# Patient Record
Sex: Female | Born: 1960 | Race: Black or African American | Hispanic: No | Smoking: Never smoker
Health system: Southern US, Community
[De-identification: ages and names within clinical notes are randomized; demographics above are authoritative.]

## PROBLEM LIST (undated history)

## (undated) DIAGNOSIS — R531 Weakness: Secondary | ICD-10-CM

## (undated) DIAGNOSIS — R7303 Prediabetes: Secondary | ICD-10-CM

## (undated) DIAGNOSIS — I10 Essential (primary) hypertension: Secondary | ICD-10-CM

## (undated) DIAGNOSIS — R569 Unspecified convulsions: Secondary | ICD-10-CM

## (undated) DIAGNOSIS — D332 Benign neoplasm of brain, unspecified: Secondary | ICD-10-CM

## (undated) HISTORY — DX: Unspecified convulsions: R56.9

## (undated) HISTORY — DX: Benign neoplasm of brain, unspecified: D33.2

## (undated) HISTORY — PX: BRAIN SURGERY: SHX531

---

## 2006-05-16 ENCOUNTER — Ambulatory Visit: Payer: Self-pay | Admitting: Obstetrics and Gynecology

## 2006-06-19 ENCOUNTER — Other Ambulatory Visit: Payer: Self-pay

## 2006-06-19 ENCOUNTER — Ambulatory Visit: Payer: Self-pay | Admitting: Obstetrics and Gynecology

## 2006-07-01 ENCOUNTER — Ambulatory Visit: Payer: Self-pay | Admitting: Obstetrics and Gynecology

## 2006-12-09 LAB — HM DEXA SCAN

## 2008-06-11 HISTORY — PX: ABDOMINAL HYSTERECTOMY: SHX81

## 2008-06-11 HISTORY — PX: BREAST BIOPSY: SHX20

## 2008-09-27 ENCOUNTER — Ambulatory Visit: Payer: Self-pay | Admitting: Surgery

## 2008-10-05 ENCOUNTER — Ambulatory Visit: Payer: Self-pay | Admitting: Surgery

## 2009-08-03 ENCOUNTER — Ambulatory Visit: Payer: Self-pay | Admitting: Obstetrics and Gynecology

## 2010-08-11 ENCOUNTER — Emergency Department: Payer: Self-pay | Admitting: Emergency Medicine

## 2010-08-22 ENCOUNTER — Ambulatory Visit: Payer: Self-pay | Admitting: Obstetrics and Gynecology

## 2010-09-04 ENCOUNTER — Ambulatory Visit: Payer: Self-pay | Admitting: Obstetrics and Gynecology

## 2011-07-04 ENCOUNTER — Emergency Department: Payer: Self-pay | Admitting: Emergency Medicine

## 2011-07-04 LAB — CBC WITH DIFFERENTIAL/PLATELET
Basophil #: 0 10*3/uL (ref 0.0–0.1)
Eosinophil %: 0.8 %
Lymphocyte #: 2.3 10*3/uL (ref 1.0–3.6)
MCHC: 33.8 g/dL (ref 32.0–36.0)
Monocyte #: 0.6 10*3/uL (ref 0.0–0.7)
Monocyte %: 5.8 %
Neutrophil %: 71.6 %
Platelet: 138 10*3/uL — ABNORMAL LOW (ref 150–440)
RBC: 4.62 10*6/uL (ref 3.80–5.20)
WBC: 10.6 10*3/uL (ref 3.6–11.0)

## 2011-07-04 LAB — COMPREHENSIVE METABOLIC PANEL
Alkaline Phosphatase: 63 U/L (ref 50–136)
BUN: 13 mg/dL (ref 7–18)
Calcium, Total: 9.4 mg/dL (ref 8.5–10.1)
Chloride: 105 mmol/L (ref 98–107)
Co2: 26 mmol/L (ref 21–32)
Creatinine: 0.83 mg/dL (ref 0.60–1.30)
EGFR (African American): >60
EGFR (Non-African Amer.): >60
Glucose: 112 mg/dL — ABNORMAL HIGH (ref 65–99)
SGOT(AST): 18 U/L (ref 15–37)
SGPT (ALT): 24 U/L
Total Protein: 7 g/dL (ref 6.4–8.2)

## 2011-07-04 LAB — URINALYSIS, COMPLETE
Bilirubin,UR: NEGATIVE
Blood: NEGATIVE
Glucose,UR: NEGATIVE mg/dL (ref 0–75)
Leukocyte Esterase: NEGATIVE
Ph: 6 (ref 4.5–8.0)
RBC,UR: <1 /HPF (ref 0–5)
Specific Gravity: 1.014 (ref 1.003–1.030)
Squamous Epithelial: <1

## 2011-07-12 ENCOUNTER — Ambulatory Visit: Payer: Self-pay | Admitting: Neurology

## 2011-07-16 ENCOUNTER — Ambulatory Visit: Payer: Self-pay | Admitting: Neurology

## 2011-09-14 ENCOUNTER — Ambulatory Visit: Payer: Self-pay | Admitting: Obstetrics and Gynecology

## 2011-09-14 LAB — HM MAMMOGRAPHY

## 2012-03-17 ENCOUNTER — Ambulatory Visit: Payer: Self-pay | Admitting: Surgery

## 2012-04-07 ENCOUNTER — Ambulatory Visit: Payer: Self-pay | Admitting: Surgery

## 2012-09-11 ENCOUNTER — Encounter: Payer: Self-pay | Admitting: Internal Medicine

## 2012-09-11 DIAGNOSIS — D496 Neoplasm of unspecified behavior of brain: Secondary | ICD-10-CM

## 2012-09-11 DIAGNOSIS — Z87898 Personal history of other specified conditions: Secondary | ICD-10-CM | POA: Insufficient documentation

## 2012-09-15 ENCOUNTER — Ambulatory Visit (INDEPENDENT_AMBULATORY_CARE_PROVIDER_SITE_OTHER): Payer: Medicare Other | Admitting: Internal Medicine

## 2012-09-15 ENCOUNTER — Encounter: Payer: Self-pay | Admitting: Internal Medicine

## 2012-09-15 VITALS — BP 128/78 | HR 80 | Temp 97.8°F | Resp 18 | Ht 62.0 in | Wt 175.5 lb

## 2012-09-15 DIAGNOSIS — D496 Neoplasm of unspecified behavior of brain: Secondary | ICD-10-CM

## 2012-09-15 DIAGNOSIS — Z13220 Encounter for screening for lipoid disorders: Secondary | ICD-10-CM

## 2012-09-15 DIAGNOSIS — G40909 Epilepsy, unspecified, not intractable, without status epilepticus: Secondary | ICD-10-CM

## 2012-09-16 ENCOUNTER — Ambulatory Visit: Payer: Self-pay | Admitting: Surgery

## 2012-09-28 ENCOUNTER — Encounter: Payer: Self-pay | Admitting: Internal Medicine

## 2012-09-28 DIAGNOSIS — Z8669 Personal history of other diseases of the nervous system and sense organs: Secondary | ICD-10-CM | POA: Insufficient documentation

## 2012-09-28 NOTE — Progress Notes (Signed)
  Subjective:    Patient ID: Rachael Zimmerman, female    DOB: Nov 06, 1960, 52 y.o.   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  Still has some residual problems in her right lower extremity.  Also has a history of recent seizure.  She comes in today to follow up on these issues as well as to establish care.  Daughter of Benjaman Lobe.  After her brain surgery, she was placed on Dilantin.  This was stopped.  Had a seizure 06/18/11 - witnessed.  Seeing Dr Sherryll Burger now.  On Keppra and has done well on this medication.  Has a history of abnormal pap smear 05/25/10 - pap ASCUS /positive HPV.  Had colpo 05/27/10 - no dysplasia.  Sees Dr  Logan Bores regularly now.  He does her pelvic and pap smears.  States she is up to date.  Overall she feels she is doing well.  Tries to stay active.  No cardiac symptoms with increased activity or exertion.  Breathing stable.  Bowels stable.    Past Medical History  Diagnosis Date  . Seizures   . Brain tumor (benign)     s/p craniectomy    Outpatient Encounter Prescriptions as of 09/15/2012  Medication Sig Dispense Refill  . estrogen-methylTESTOSTERone 0.625-1.25 MG per tablet Take 1 tablet by mouth daily.      Marland Kitchen levETIRAcetam (KEPPRA) 500 MG tablet Take 500 mg by mouth 2 (two) times daily.       No facility-administered encounter medications on file as of 09/15/2012.    Review of Systems Patient denies any headache, lightheadedness or dizziness.  On Keppra now.  No seizures.  Followed by Dr Sherryll Burger.  No chest pain, tightness or palpitations.  No increased shortness of breath, cough or congestion.  No nausea or vomiting.  No abdominal pain or cramping.  No bowel change, such as diarrhea, constipation, BRBPR or melana.  No urine change.  Overalls he feels she is doing well.       Objective:   Physical Exam Filed Vitals:   09/15/12 1038  BP: 128/78  Pulse: 80  Temp: 97.8 F (36.6 C)  Resp: 25   52 year old female in no acute distress.   HEENT:  Nares- clear.  Oropharynx - without lesions. NECK:  Supple.  Nontender.  No audible bruit.  HEART:  Appears to be regular. LUNGS:  No crackles or wheezing audible.  Respirations even and unlabored.  RADIAL PULSE:  Equal bilaterally.  ABDOMEN:  Soft, nontender.  Bowel sounds present and normal.  No audible abdominal bruit.   EXTREMITIES:  No increased edema present.  DP pulses palpable and equal bilaterally.          Assessment & Plan:  ELEVATED BLOOD PRESSURE.  Blood pressure on my check a little elevated.  Have her spot check her pressure.  Get her back in soon to reassess.  Check metabolic panel.    HEALTH MAINTENANCE.  Breast, pelvic and pap smears through Dr Logan Bores office.  Obtain results.  States she is up to date.  Needs colonoscopy - if not had.

## 2012-09-28 NOTE — Assessment & Plan Note (Signed)
S/p excision/craniectomy.  Has done well.  Just has some residual right leg weakness.  Follow.  Continues to follow up with Dr Shah.   

## 2012-09-28 NOTE — Assessment & Plan Note (Signed)
Followed by Dr Shah.  On Keppra and has done well.  Follow.   

## 2012-10-29 ENCOUNTER — Other Ambulatory Visit (INDEPENDENT_AMBULATORY_CARE_PROVIDER_SITE_OTHER): Payer: Medicare Other

## 2012-10-29 DIAGNOSIS — Z13220 Encounter for screening for lipoid disorders: Secondary | ICD-10-CM

## 2012-10-29 DIAGNOSIS — G40909 Epilepsy, unspecified, not intractable, without status epilepticus: Secondary | ICD-10-CM

## 2012-10-29 LAB — CBC WITH DIFFERENTIAL/PLATELET
Basophils Absolute: 0 10*3/uL (ref 0.0–0.1)
Eosinophils Relative: 1.2 % (ref 0.0–5.0)
MCV: 87.9 fl (ref 78.0–100.0)
Monocytes Absolute: 0.4 10*3/uL (ref 0.1–1.0)
Neutrophils Relative %: 53.7 % (ref 43.0–77.0)
Platelets: 153 10*3/uL (ref 150.0–400.0)
WBC: 7.1 10*3/uL (ref 4.5–10.5)

## 2012-10-29 LAB — TSH: TSH: 1.95 u[IU]/mL (ref 0.35–5.50)

## 2012-10-29 LAB — COMPREHENSIVE METABOLIC PANEL
ALT: 18 U/L (ref 0–35)
AST: 16 U/L (ref 0–37)
Albumin: 3.7 g/dL (ref 3.5–5.2)
Alkaline Phosphatase: 69 U/L (ref 39–117)
Glucose, Bld: 75 mg/dL (ref 70–99)
Potassium: 4.3 mEq/L (ref 3.5–5.1)
Sodium: 140 mEq/L (ref 135–145)
Total Protein: 7 g/dL (ref 6.0–8.3)

## 2012-10-29 LAB — LIPID PANEL
Cholesterol: 167 mg/dL (ref 0–200)
HDL: 50.3 mg/dL (ref >39.00)

## 2012-10-30 ENCOUNTER — Encounter: Payer: Self-pay | Admitting: *Deleted

## 2012-11-05 ENCOUNTER — Ambulatory Visit: Payer: Medicare Other | Admitting: Internal Medicine

## 2012-11-17 ENCOUNTER — Ambulatory Visit: Payer: Medicare Other | Admitting: Internal Medicine

## 2012-11-18 ENCOUNTER — Ambulatory Visit (INDEPENDENT_AMBULATORY_CARE_PROVIDER_SITE_OTHER): Payer: Medicare Other | Admitting: Internal Medicine

## 2012-11-18 ENCOUNTER — Encounter: Payer: Self-pay | Admitting: Internal Medicine

## 2012-11-18 VITALS — BP 130/90 | HR 84 | Temp 98.6°F | Ht 62.0 in | Wt 175.5 lb

## 2012-11-18 DIAGNOSIS — G40909 Epilepsy, unspecified, not intractable, without status epilepticus: Secondary | ICD-10-CM

## 2012-11-18 DIAGNOSIS — D496 Neoplasm of unspecified behavior of brain: Secondary | ICD-10-CM

## 2012-11-19 ENCOUNTER — Encounter: Payer: Self-pay | Admitting: Internal Medicine

## 2012-11-19 NOTE — Assessment & Plan Note (Signed)
S/p excision/craniectomy.  Has done well.  Just has some residual right leg weakness.  Follow.  Continues to follow up with Dr Shah.   

## 2012-11-19 NOTE — Progress Notes (Signed)
Subjective:    Patient ID: Rachael Zimmerman, female    DOB: 04-20-1961, 52 y.o.   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  Still has some residual problems in her right lower extremity.  Also has a history of recent seizure.  She comes in today for a scheduled follow up.   After her brain surgery, she was placed on Dilantin.  This was stopped.  Had a seizure 06/18/11 - witnessed.  Seeing Dr Sherryll Burger now.  On Keppra and has done well on this medication.  Has a history of abnormal pap smear 05/25/10 - pap ASCUS /positive HPV.  Had colpo 05/27/10 - no dysplasia.  Sees Dr  Logan Bores regularly now.  He does her pelvic and pap smears.  States she is up to date.   Tries to stay active. Has just started exercising.  No cardiac symptoms with increased activity or exertion.  Breathing stable.  Bowels stable. States her blood pressures at home have been averaging 128-130/85.  Increased stress.  Her fiancee had a stroke yesterday.  She was at the hospital all last night.  Did not sleep well.  Feels she is handling stress relatively well.  We discussed the use of estratest.  She is now taking this 3x/week.  Doing well on this dose.     Past Medical History  Diagnosis Date  . Seizures   . Brain tumor (benign)     s/p craniectomy    Outpatient Encounter Prescriptions as of 11/18/2012  Medication Sig Dispense Refill  . estrogen-methylTESTOSTERone 0.625-1.25 MG per tablet Take 1 tablet by mouth daily.      Marland Kitchen levETIRAcetam (KEPPRA) 500 MG tablet Take 500 mg by mouth 2 (two) times daily.       No facility-administered encounter medications on file as of 11/18/2012.    Review of Systems Patient denies any headache, lightheadedness or dizziness.  On Keppra now.  No seizures.  Followed by Dr Sherryll Burger.  No chest pain, tightness or palpitations.  No increased shortness of  breath, cough or congestion.  No nausea or vomiting.  No abdominal pain or cramping.  No bowel change, such as diarrhea, constipation, BRBPR or melana.  No urine change.  Has started exercising. Increased stress as outlined.      Objective:   Physical Exam  Filed Vitals:   11/18/12 1108  BP: 130/90  Pulse: 84  Temp: 98.6 F (37 C)   Blood pressure recheck:  120/74, pulse 65  52 year old female in no acute distress.   HEENT:  Nares- clear.  Oropharynx - without lesions. NECK:  Supple.  Nontender.  No audible bruit.  HEART:  Appears to be regular. LUNGS:  No crackles or wheezing audible.  Respirations even and unlabored.  RADIAL PULSE:  Equal bilaterally.  ABDOMEN:  Soft, nontender.  Bowel sounds present and normal.  No audible abdominal bruit.   EXTREMITIES:  No increased edema present.  DP pulses palpable and equal bilaterally.          Assessment & Plan:  ELEVATED BLOOD PRESSURE.  Blood pressure as outlined.  Follow.  Increased stress.  Follow metabolic panel.  Discussed the use of estratest.  She has cut down.  Will discuss with Dr Logan Bores.   HEALTH MAINTENANCE.  Breast, pelvic and pap smears through Dr Logan Bores office.   Needs colonoscopy - if not had.

## 2012-11-19 NOTE — Assessment & Plan Note (Signed)
Followed by Dr Shah.  On Keppra and has done well.  Follow.   

## 2013-01-06 ENCOUNTER — Encounter: Payer: Self-pay | Admitting: Internal Medicine

## 2013-01-06 ENCOUNTER — Ambulatory Visit (INDEPENDENT_AMBULATORY_CARE_PROVIDER_SITE_OTHER): Payer: Medicare Other | Admitting: Internal Medicine

## 2013-01-06 VITALS — BP 120/90 | HR 73 | Temp 98.5°F | Ht 62.0 in | Wt 175.8 lb

## 2013-01-06 DIAGNOSIS — I10 Essential (primary) hypertension: Secondary | ICD-10-CM

## 2013-01-06 DIAGNOSIS — G40909 Epilepsy, unspecified, not intractable, without status epilepticus: Secondary | ICD-10-CM

## 2013-01-06 DIAGNOSIS — D496 Neoplasm of unspecified behavior of brain: Secondary | ICD-10-CM

## 2013-01-06 MED ORDER — HYDROCHLOROTHIAZIDE 12.5 MG PO CAPS: 12.5000 mg | ORAL_CAPSULE | Freq: Every day | ORAL | Status: DC

## 2013-01-08 ENCOUNTER — Encounter: Payer: Self-pay | Admitting: Internal Medicine

## 2013-01-08 NOTE — Progress Notes (Signed)
Subjective:    Patient ID: Rachael Zimmerman, female    DOB: 1960-06-21, 52 y.o.   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  Still has some residual problems in her right lower extremity.  Also has a history of recent seizure.  She comes in today for a scheduled follow up.   After her brain surgery, she was placed on Dilantin.  This was stopped.  Had a seizure 06/18/11 - witnessed.  Seeing Dr Sherryll Burger now.  On Keppra and has done well on this medication.  Has a history of abnormal pap smear 05/25/10 - pap ASCUS /positive HPV.  Had colpo 05/27/10 - no dysplasia.  Sees Dr  Logan Bores regularly now.  He does her pelvic and pap smears.  States she is up to date.   Tries to stay active.  No cardiac symptoms with increased activity or exertion.  Breathing stable.  Bowels stable.  Stress better.  Blood pressure averaging 120's/90.  She is taking estratest 3x/week.  Doing fine on this dose.  No hot flashes.  Denies decreased libido.     Past Medical History  Diagnosis Date  . Seizures   . Brain tumor (benign)     s/p craniectomy    Outpatient Encounter Prescriptions as of 01/06/2013  Medication Sig Dispense Refill  . estrogen-methylTESTOSTERone 0.625-1.25 MG per tablet Take 1 tablet by mouth daily.      Marland Kitchen levETIRAcetam (KEPPRA) 500 MG tablet Take 500 mg by mouth 2 (two) times daily.      . hydrochlorothiazide (MICROZIDE) 12.5 MG capsule Take 1 capsule (12.5 mg total) by mouth daily.  30 capsule  2   No facility-administered encounter medications on file as of 01/06/2013.    Review of Systems Patient denies any headache, lightheadedness or dizziness.  On Keppra now.  No seizures.  Followed by Dr Sherryll Burger.  No chest pain, tightness or palpitations.  No increased shortness of breath, cough or congestion.  No nausea or vomiting.  No abdominal pain or cramping.  No  bowel change, such as diarrhea, constipation, BRBPR or melana.  No urine change.  Has started exercising.  Stress better.  Blood pressure elevated.       Objective:   Physical Exam  Filed Vitals:   01/06/13 1430  BP: 120/90  Pulse: 73  Temp: 98.5 F (36.9 C)   Blood pressure recheck:  64s/22-65  52 year old female in no acute distress.   HEENT:  Nares- clear.  Oropharynx - without lesions. NECK:  Supple.  Nontender.  No audible bruit.  HEART:  Appears to be regular. LUNGS:  No crackles or wheezing audible.  Respirations even and unlabored.  RADIAL PULSE:  Equal bilaterally.  ABDOMEN:  Soft, nontender.  Bowel sounds present and normal.  No audible abdominal bruit.   EXTREMITIES:  No increased edema present.  DP pulses palpable and equal bilaterally.          Assessment & Plan:  ELEVATED BLOOD PRESSURE.  Blood pressure as outlined.  Stress better.  Follow metabolic panel.  Discussed the use of estratest.  She has cut down.  Will schedule an appt with Dr Logan Bores to discuss continued use of estratest.  Pt desired to discuss this with him.  Will start HCTZ 12.5mg  q day.  Follow pressures.  Get her back in soon to reassess.   HEALTH MAINTENANCE.  Breast, pelvic and pap smears through Dr Logan Bores office.   Needs colonoscopy - if not had.

## 2013-01-08 NOTE — Assessment & Plan Note (Signed)
Followed by Dr Shah.  On Keppra and has done well.  Follow.   

## 2013-01-08 NOTE — Assessment & Plan Note (Signed)
S/p excision/craniectomy.  Has done well.  Just has some residual right leg weakness.  Follow.  Continues to follow up with Dr Shah.   

## 2013-02-04 ENCOUNTER — Encounter: Payer: Self-pay | Admitting: Internal Medicine

## 2013-02-04 ENCOUNTER — Ambulatory Visit (INDEPENDENT_AMBULATORY_CARE_PROVIDER_SITE_OTHER): Payer: Medicare Other | Admitting: Internal Medicine

## 2013-02-04 VITALS — BP 124/90 | HR 68 | Temp 98.0°F | Ht 62.0 in | Wt 175.5 lb

## 2013-02-04 DIAGNOSIS — I10 Essential (primary) hypertension: Secondary | ICD-10-CM

## 2013-02-04 DIAGNOSIS — G40909 Epilepsy, unspecified, not intractable, without status epilepticus: Secondary | ICD-10-CM

## 2013-02-04 DIAGNOSIS — D496 Neoplasm of unspecified behavior of brain: Secondary | ICD-10-CM

## 2013-02-04 LAB — BASIC METABOLIC PANEL
BUN: 11 mg/dL (ref 6–23)
CO2: 28 mEq/L (ref 19–32)
Calcium: 9.8 mg/dL (ref 8.4–10.5)
Glucose, Bld: 108 mg/dL — ABNORMAL HIGH (ref 70–99)
Sodium: 138 mEq/L (ref 135–145)

## 2013-02-05 ENCOUNTER — Encounter: Payer: Self-pay | Admitting: *Deleted

## 2013-02-08 ENCOUNTER — Encounter: Payer: Self-pay | Admitting: Internal Medicine

## 2013-02-08 DIAGNOSIS — I10 Essential (primary) hypertension: Secondary | ICD-10-CM | POA: Insufficient documentation

## 2013-02-08 NOTE — Assessment & Plan Note (Signed)
S/p excision/craniectomy.  Has done well.  Just has some residual right leg weakness.  Follow.  Continues to follow up with Dr Shah.   

## 2013-02-08 NOTE — Assessment & Plan Note (Signed)
Blood pressure doing better.  On HCTZ now.  Tolerating.  Will continue the current dose of HCTZ.  Follow pressures.  Check metabolic panel.

## 2013-02-08 NOTE — Assessment & Plan Note (Signed)
Followed by Dr Shah.  On Keppra and has done well.  Follow.   

## 2013-02-08 NOTE — Progress Notes (Signed)
  Subjective:    Patient ID: Rachael Zimmerman, female    DOB: [birth year], 52 y.o.   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  Still has some residual problems in her right lower extremity.  Also has a history of recent seizure.  She comes in today for a scheduled follow up.   After her brain surgery, she was placed on Dilantin.  This was stopped.  Had a seizure 06/18/11 - witnessed.  Seeing Dr Sherryll Burger now.  On Keppra and has done well on this medication.  Has a history of abnormal pap smear 05/25/10 - pap ASCUS /positive HPV.  Had colpo 05/27/10 - no dysplasia.  Sees Dr  Logan Bores regularly now.  He does her pelvic and pap smears.  States she is up to date.   Tries to stay active.  No cardiac symptoms with increased activity or exertion.  Breathing stable.  Bowels stable.  Stress better.  Blood pressure at Dr Logan Bores - 130/84 and another outside check 131/85.  Discussed with Dr Logan Bores regarding stopping the estratest.  She is tapering off.  Overall she feels she is doing well.  Tolerating the hctz.  No problems.     Past Medical History  Diagnosis Date  . Seizures   . Brain tumor (benign)     s/p craniectomy    Outpatient Encounter Prescriptions as of 02/04/2013  Medication Sig Dispense Refill  . estrogen-methylTESTOSTERone 0.625-1.25 MG per tablet Take 1 tablet by mouth daily.      . hydrochlorothiazide (MICROZIDE) 12.5 MG capsule Take 1 capsule (12.5 mg total) by mouth daily.  30 capsule  2  . levETIRAcetam (KEPPRA) 500 MG tablet Take 500 mg by mouth 2 (two) times daily.       No facility-administered encounter medications on file as of 02/04/2013.    Review of Systems Patient denies any headache, lightheadedness or dizziness.  On Keppra now.  No seizures.  Followed by Dr Sherryll Burger.  No chest pain, tightness or palpitations.  No increased shortness of  breath, cough or congestion.  No nausea or vomiting.  No abdominal pain or cramping.  No bowel change, such as diarrhea, constipation, BRBPR or melana.  No urine change.  Has started exercising.  Stress better.  Blood pressure as outlined.  Tolerating the hctz.       Objective:   Physical Exam  Filed Vitals:   02/04/13 1104  BP: 124/90  Pulse: 68  Temp: 98 F (36.7 C)   Blood pressure recheck:  43s/24-52  52 year old female in no acute distress.   HEENT:  Nares- clear.  Oropharynx - without lesions. NECK:  Supple.  Nontender.  No audible bruit.  HEART:  Appears to be regular. LUNGS:  No crackles or wheezing audible.  Respirations even and unlabored.  RADIAL PULSE:  Equal bilaterally.  ABDOMEN:  Soft, nontender.  Bowel sounds present and normal.  No audible abdominal bruit.   EXTREMITIES:  No increased edema present.  DP pulses palpable and equal bilaterally.          Assessment & Plan:  .HEALTH MAINTENANCE.  Breast, pelvic and pap smears through Dr Logan Bores office.   Needs colonoscopy - if not had.

## 2013-04-03 ENCOUNTER — Other Ambulatory Visit: Payer: Self-pay | Admitting: Internal Medicine

## 2013-04-06 ENCOUNTER — Ambulatory Visit: Payer: Medicare Other | Admitting: Internal Medicine

## 2013-04-21 ENCOUNTER — Ambulatory Visit (INDEPENDENT_AMBULATORY_CARE_PROVIDER_SITE_OTHER): Payer: Medicare Other | Admitting: Internal Medicine

## 2013-04-21 ENCOUNTER — Encounter: Payer: Self-pay | Admitting: Internal Medicine

## 2013-04-21 ENCOUNTER — Encounter (INDEPENDENT_AMBULATORY_CARE_PROVIDER_SITE_OTHER): Payer: Self-pay

## 2013-04-21 VITALS — BP 120/80 | HR 77 | Temp 97.4°F | Ht 62.0 in | Wt 179.5 lb

## 2013-04-21 DIAGNOSIS — G40909 Epilepsy, unspecified, not intractable, without status epilepticus: Secondary | ICD-10-CM

## 2013-04-21 DIAGNOSIS — I10 Essential (primary) hypertension: Secondary | ICD-10-CM

## 2013-04-21 DIAGNOSIS — D496 Neoplasm of unspecified behavior of brain: Secondary | ICD-10-CM

## 2013-04-21 NOTE — Assessment & Plan Note (Addendum)
Followed by Dr Shah.  On Keppra and has done well.  Follow.   

## 2013-04-21 NOTE — Progress Notes (Signed)
Pre-visit discussion using our clinic review tool. No additional management support is needed unless otherwise documented below in the visit note.  

## 2013-04-21 NOTE — Progress Notes (Signed)
  Subjective:    Patient ID: Rachael Zimmerman, female    DOB: [birth year], 52 y.o.   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  Still has some residual problems in her right lower extremity.  Also has a history of recent seizure.  She comes in today for a scheduled follow up.   After her brain surgery, she was placed on Dilantin.  This was stopped.  Had a seizure 06/18/11 - witnessed.  Seeing Dr Sherryll Burger now.  On Keppra and has done well on this medication.  Has a history of abnormal pap smear 05/25/10 - pap ASCUS /positive HPV.  Had colpo 05/27/10 - no dysplasia.  Sees Dr  Logan Bores regularly now.  He does her pelvic and pap smears.  States she is up to date.   Tries to stay active.  No cardiac symptoms with increased activity or exertion.  Breathing stable.  Bowels stable.  Stress better.   Off estratest.  Overall she feels she is doing well.  Tolerating the hctz.  No problems.  States her blood pressure is averaging 120s/80s.     Past Medical History  Diagnosis Date  . Seizures   . Brain tumor (benign)     s/p craniectomy    Outpatient Encounter Prescriptions as of 04/21/2013  Medication Sig  . hydrochlorothiazide (MICROZIDE) 12.5 MG capsule TAKE ONE CAPSULE BY MOUTH DAILY.  Marland Kitchen levETIRAcetam (KEPPRA) 500 MG tablet Take 500 mg by mouth 2 (two) times daily.  . [DISCONTINUED] estrogen-methylTESTOSTERone 0.625-1.25 MG per tablet Take 1 tablet by mouth daily.    Review of Systems Patient denies any headache, lightheadedness or dizziness.  On Keppra now.  No seizures.  Followed by Dr Sherryll Burger.  No chest pain, tightness or palpitations.  No increased shortness of breath, cough or congestion.  No nausea or vomiting.  No abdominal pain or cramping.  No bowel change, such as diarrhea, constipation, BRBPR or melana.  No urine change. Stress better.  Blood  pressure as outlined.  Tolerating the hctz.       Objective:   Physical Exam  Filed Vitals:   04/21/13 1019  BP: 120/80  Pulse: 77  Temp: 97.4 F (71.32 C)   52 year old female in no acute distress.   HEENT:  Nares- clear.  Oropharynx - without lesions. NECK:  Supple.  Nontender.  No audible bruit.  HEART:  Appears to be regular. LUNGS:  No crackles or wheezing audible.  Respirations even and unlabored.  RADIAL PULSE:  Equal bilaterally.  ABDOMEN:  Soft, nontender.  Bowel sounds present and normal.  No audible abdominal bruit.   EXTREMITIES:  No increased edema present.  DP pulses palpable and equal bilaterally.          Assessment & Plan:  .HEALTH MAINTENANCE.  Breast, pelvic and pap smears through Dr Logan Bores office.   Needs colonoscopy - if not had.

## 2013-04-21 NOTE — Assessment & Plan Note (Addendum)
S/p excision/craniectomy.  Has done well.  Just has some residual right leg weakness.  Follow.  Continues to follow up with Dr Shah.   

## 2013-04-21 NOTE — Assessment & Plan Note (Addendum)
Blood pressure doing better.  On HCTZ now.  Tolerating.  Will continue the current dose of HCTZ.  Follow pressures.  Follow metabolic panel.

## 2013-04-25 ENCOUNTER — Encounter: Payer: Self-pay | Admitting: Internal Medicine

## 2013-06-02 ENCOUNTER — Encounter: Payer: Self-pay | Admitting: Neurology

## 2013-06-11 ENCOUNTER — Encounter: Payer: Self-pay | Admitting: Neurology

## 2013-07-12 ENCOUNTER — Encounter: Payer: Self-pay | Admitting: Neurology

## 2013-08-09 ENCOUNTER — Encounter: Payer: Self-pay | Admitting: Neurology

## 2013-08-20 ENCOUNTER — Ambulatory Visit (INDEPENDENT_AMBULATORY_CARE_PROVIDER_SITE_OTHER): Payer: Medicare Other | Admitting: Internal Medicine

## 2013-08-20 ENCOUNTER — Encounter (INDEPENDENT_AMBULATORY_CARE_PROVIDER_SITE_OTHER): Payer: Self-pay

## 2013-08-20 ENCOUNTER — Encounter: Payer: Self-pay | Admitting: Internal Medicine

## 2013-08-20 VITALS — BP 118/80 | HR 85 | Temp 98.5°F | Ht 62.0 in | Wt 185.2 lb

## 2013-08-20 DIAGNOSIS — I10 Essential (primary) hypertension: Secondary | ICD-10-CM

## 2013-08-20 DIAGNOSIS — G40909 Epilepsy, unspecified, not intractable, without status epilepticus: Secondary | ICD-10-CM

## 2013-08-20 DIAGNOSIS — D496 Neoplasm of unspecified behavior of brain: Secondary | ICD-10-CM

## 2013-08-20 DIAGNOSIS — Z1211 Encounter for screening for malignant neoplasm of colon: Secondary | ICD-10-CM

## 2013-08-20 DIAGNOSIS — Z0279 Encounter for issue of other medical certificate: Secondary | ICD-10-CM

## 2013-08-20 LAB — BASIC METABOLIC PANEL
BUN: 16 mg/dL (ref 6–23)
CALCIUM: 10 mg/dL (ref 8.4–10.5)
CO2: 27 mEq/L (ref 19–32)
Chloride: 106 mEq/L (ref 96–112)
Creatinine, Ser: 0.9 mg/dL (ref 0.4–1.2)
GFR: 81.16 mL/min (ref >60.00)
Glucose, Bld: 111 mg/dL — ABNORMAL HIGH (ref 70–99)
POTASSIUM: 4.1 meq/L (ref 3.5–5.1)
SODIUM: 140 meq/L (ref 135–145)

## 2013-08-20 NOTE — Progress Notes (Signed)
  Subjective:    Patient ID: Rachael Zimmerman, female    DOB: 1960/09/12, 53 y.o.   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  Still has some residual problems in her right lower extremity.  Also has a history of recent seizure.  She comes in today for a scheduled follow up.   After her brain surgery, she was placed on Dilantin.  This was stopped.  Had a seizure 06/18/11 - witnessed.  Seeing Dr Manuella Ghazi now.  On Keppra and has done well on this medication.  Has a history of abnormal pap smear 05/25/10 - pap ASCUS /positive HPV.  Had colpo 05/27/10 - no dysplasia.  Sees Dr  Amalia Hailey regularly now.  He does her pelvic and pap smears.  States she is up to date.   Tries to stay active.  No cardiac symptoms with increased activity or exertion.  Breathing stable.  Bowels stable.  Stress better.   Off estratest.  Overall she feels she is doing well.  Tolerating the hctz.  No problems.  States her blood pressure is averaging 118-120/78082.     Past Medical History  Diagnosis Date  . Seizures   . Brain tumor (benign)     s/p craniectomy    Outpatient Encounter Prescriptions as of 08/20/2013  Medication Sig  . hydrochlorothiazide (MICROZIDE) 12.5 MG capsule TAKE ONE CAPSULE BY MOUTH DAILY.  Marland Kitchen levETIRAcetam (KEPPRA) 500 MG tablet Take 500 mg by mouth 2 (two) times daily.    Review of Systems Patient denies any headache, lightheadedness or dizziness.  On Keppra now.  No seizures.  Followed by Dr Manuella Ghazi.  No chest pain, tightness or palpitations.  No increased shortness of breath, cough or congestion.  No nausea or vomiting.  No abdominal pain or cramping.  No bowel change, such as diarrhea, constipation, BRBPR or melana.  No urine change. Stress better.  Blood pressure as outlined.  Tolerating the hctz.       Objective:   Physical Exam  Filed Vitals:   08/20/13 0806  BP: 118/80  Pulse: 85  Temp: 98.5 F (36.9 C)   Blood pressure recheck:  30/91  53 year old female in no acute distress.   HEENT:  Nares- clear.  Oropharynx - without lesions. NECK:  Supple.  Nontender.  No audible bruit.  HEART:  Appears to be regular. LUNGS:  No crackles or wheezing audible.  Respirations even and unlabored.  RADIAL PULSE:  Equal bilaterally.  ABDOMEN:  Soft, nontender.  Bowel sounds present and normal.  No audible abdominal bruit.   EXTREMITIES:  No increased edema present.          Assessment & Plan:  .HEALTH MAINTENANCE.  Breast, pelvic and pap smears through Dr Amalia Hailey office.   Just had 1/15.  States everything checked out fine.  Obtain records.  Needs colonoscopy.  Discussed with her today.  She will think about this and let me know if agreeable.  Scheduled for her f/u mammogram 09/17/13.  IFOB given.

## 2013-08-20 NOTE — Assessment & Plan Note (Addendum)
S/p excision/craniectomy.  Has done well.  Just has some residual right leg weakness.  Follow.  Continues to follow up with Dr Shah.   

## 2013-08-20 NOTE — Assessment & Plan Note (Addendum)
Blood pressure doing better.  On HCTZ.   Tolerating.  Will continue the current dose of HCTZ.  Follow pressures.  Follow metabolic panel.    

## 2013-08-20 NOTE — Progress Notes (Signed)
Pre-visit discussion using our clinic review tool. No additional management support is needed unless otherwise documented below in the visit note.  

## 2013-08-20 NOTE — Assessment & Plan Note (Addendum)
Followed by Dr Shah.  On Keppra and has done well.  Follow.   

## 2013-08-21 ENCOUNTER — Encounter: Payer: Self-pay | Admitting: *Deleted

## 2013-09-24 ENCOUNTER — Encounter: Payer: Self-pay | Admitting: Internal Medicine

## 2013-09-30 ENCOUNTER — Other Ambulatory Visit: Payer: Self-pay | Admitting: Internal Medicine

## 2013-11-20 ENCOUNTER — Telehealth: Payer: Self-pay | Admitting: Internal Medicine

## 2013-11-20 NOTE — Telephone Encounter (Signed)
Form placed in Dr. Nicki Reaper folder with note on requested pick up date.

## 2013-11-20 NOTE — Telephone Encounter (Signed)
Dropped off paperwork for employement.  Asking to pick up on Monday.  Advised may not be ready on Monday.  Asking for call when ready for pick up.

## 2013-11-20 NOTE — Telephone Encounter (Signed)
Noted. Will complete

## 2013-11-20 NOTE — Telephone Encounter (Signed)
Dropped off paperwork for employement. Asking to pick up on Monday. Advised may not be ready on Monday. Asking for call when ready for pick up.

## 2013-11-20 NOTE — Telephone Encounter (Signed)
Left message on voicemail that form is ready for pick up & placed up front.

## 2013-11-20 NOTE — Telephone Encounter (Signed)
Please see previous phone note.  Closed encounter in error.

## 2013-12-23 ENCOUNTER — Ambulatory Visit: Payer: Medicare Other | Admitting: Internal Medicine

## 2013-12-29 ENCOUNTER — Encounter: Payer: Self-pay | Admitting: Internal Medicine

## 2013-12-29 ENCOUNTER — Ambulatory Visit (INDEPENDENT_AMBULATORY_CARE_PROVIDER_SITE_OTHER): Payer: Medicare Other | Admitting: Internal Medicine

## 2013-12-29 VITALS — BP 126/80 | HR 80 | Temp 98.4°F | Ht 62.0 in | Wt 176.5 lb

## 2013-12-29 DIAGNOSIS — Z1239 Encounter for other screening for malignant neoplasm of breast: Secondary | ICD-10-CM

## 2013-12-29 DIAGNOSIS — I10 Essential (primary) hypertension: Secondary | ICD-10-CM

## 2013-12-29 DIAGNOSIS — Z13220 Encounter for screening for lipoid disorders: Secondary | ICD-10-CM

## 2013-12-29 DIAGNOSIS — D496 Neoplasm of unspecified behavior of brain: Secondary | ICD-10-CM

## 2013-12-29 DIAGNOSIS — M25569 Pain in unspecified knee: Secondary | ICD-10-CM

## 2013-12-29 DIAGNOSIS — G40909 Epilepsy, unspecified, not intractable, without status epilepticus: Secondary | ICD-10-CM

## 2013-12-29 DIAGNOSIS — M25561 Pain in right knee: Secondary | ICD-10-CM

## 2013-12-29 LAB — LIPID PANEL
CHOL/HDL RATIO: 3
Cholesterol: 162 mg/dL (ref 0–200)
HDL: 53.8 mg/dL (ref >39.00)
LDL Cholesterol: 95 mg/dL (ref 0–99)
NONHDL: 108.2
Triglycerides: 66 mg/dL (ref 0.0–149.0)
VLDL: 13.2 mg/dL (ref 0.0–40.0)

## 2013-12-29 LAB — CBC WITH DIFFERENTIAL/PLATELET
BASOS ABS: 0 10*3/uL (ref 0.0–0.1)
Basophils Relative: 0.3 % (ref 0.0–3.0)
EOS PCT: 1.3 % (ref 0.0–5.0)
Eosinophils Absolute: 0.1 10*3/uL (ref 0.0–0.7)
HEMATOCRIT: 39.1 % (ref 36.0–46.0)
HEMOGLOBIN: 13 g/dL (ref 12.0–15.0)
LYMPHS ABS: 3.7 10*3/uL (ref 0.7–4.0)
LYMPHS PCT: 43.5 % (ref 12.0–46.0)
MCHC: 33.3 g/dL (ref 30.0–36.0)
MCV: 87.8 fl (ref 78.0–100.0)
MONOS PCT: 6.3 % (ref 3.0–12.0)
Monocytes Absolute: 0.5 10*3/uL (ref 0.1–1.0)
Neutro Abs: 4.1 10*3/uL (ref 1.4–7.7)
Neutrophils Relative %: 48.6 % (ref 43.0–77.0)
Platelets: 155 10*3/uL (ref 150.0–400.0)
RBC: 4.45 Mil/uL (ref 3.87–5.11)
RDW: 13.9 % (ref 11.5–15.5)
WBC: 8.4 10*3/uL (ref 4.0–10.5)

## 2013-12-29 LAB — COMPREHENSIVE METABOLIC PANEL
ALT: 26 U/L (ref 0–35)
AST: 21 U/L (ref 0–37)
Albumin: 4 g/dL (ref 3.5–5.2)
Alkaline Phosphatase: 71 U/L (ref 39–117)
BILIRUBIN TOTAL: 0.8 mg/dL (ref 0.2–1.2)
BUN: 13 mg/dL (ref 6–23)
CALCIUM: 10.3 mg/dL (ref 8.4–10.5)
CHLORIDE: 102 meq/L (ref 96–112)
CO2: 27 meq/L (ref 19–32)
Creatinine, Ser: 0.9 mg/dL (ref 0.4–1.2)
GFR: 86.39 mL/min (ref >60.00)
GLUCOSE: 103 mg/dL — AB (ref 70–99)
Potassium: 4 mEq/L (ref 3.5–5.1)
Sodium: 137 mEq/L (ref 135–145)
Total Protein: 7.4 g/dL (ref 6.0–8.3)

## 2013-12-29 LAB — TSH: TSH: 1.4 u[IU]/mL (ref 0.35–4.50)

## 2013-12-29 NOTE — Progress Notes (Signed)
Pre visit review using our clinic review tool, if applicable. No additional management support is needed unless otherwise documented below in the visit note. 

## 2013-12-29 NOTE — Progress Notes (Signed)
Subjective:    Patient ID: Rachael Zimmerman, female    DOB: 1960-10-15, 53 y.o.   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  Still has some residual problems in her right lower extremity.  Also has a history of seizure disorder.   She comes in today for a scheduled follow up.   After her brain surgery, she was placed on Dilantin.  This was stopped.  Had a seizure 06/18/11 - witnessed.  Seeing Dr Manuella Ghazi now.  On Keppra and has done well on this medication.  Has a history of abnormal pap smear 05/25/10 - pap ASCUS /positive HPV.  Had colpo 05/27/10 - no dysplasia.  Sees Dr  Amalia Hailey regularly now.  He does her pelvic and pap smears.  States she is up to date.  Last 1/15.  Planning to follow up with Dr Ouida Sills.  Tries to stay active.  No cardiac symptoms with increased activity or exertion.  Breathing stable.  Bowels stable.  Stress better.   Off estratest.  Overall she feels she is doing well.  Tolerating the hctz.  No problems.  Did injure her knee in 5/15.  Went to acute care and then ortho.  Xray unrevealing.  Wearing a knee support.  Still with increased pain especially after sitting for a while.  Also notices some increased discomfort at the end of the day.     Past Medical History  Diagnosis Date  . Seizures   . Brain tumor (benign)     s/p craniectomy    Outpatient Encounter Prescriptions as of 12/29/2013  Medication Sig  . hydrochlorothiazide (MICROZIDE) 12.5 MG capsule TAKE ONE CAPSULE BY MOUTH DAILY.  Marland Kitchen levETIRAcetam (KEPPRA) 500 MG tablet Take 500 mg by mouth 2 (two) times daily.    Review of Systems Patient denies any headache, lightheadedness or dizziness.  On Keppra now.  No seizures.  Followed by Dr Manuella Ghazi.  No chest pain, tightness or palpitations.  No increased shortness of breath, cough or congestion.  No nausea or vomiting.   No acid reflux reported.  No abdominal pain or cramping.  No bowel change, such as diarrhea, constipation, BRBPR or melana.  No urine change. Stress better.  Tolerating the hctz.  Right knee pain as outlined.       Objective:   Physical Exam  Filed Vitals:   12/29/13 0903  BP: 126/80  Pulse: 80  Temp: 98.4 F (36.9 C)   Blood pressure recheck:  94/89  53 year old female in no acute distress.   HEENT:  Nares- clear.  Oropharynx - without lesions. NECK:  Supple.  Nontender.  No audible bruit.  HEART:  Appears to be regular. LUNGS:  No crackles or wheezing audible.  Respirations even and unlabored.  RADIAL PULSE:  Equal bilaterally.  ABDOMEN:  Soft, nontender.  Bowel sounds present and normal.  No audible abdominal bruit.   EXTREMITIES:  No increased edema present.   MSK:  Right knee tenderness to palpation over the right lateral aspect of the knee.  No significant pain with flexion and full extension.          Assessment & Plan:  .HEALTH MAINTENANCE.  Breast, pelvic and pap smears through Dr Amalia Hailey office.   Just had 1/15.  States everything checked out fine.  Planning to follow up with Dr Ouida Sills.  Needs colonoscopy.  Discussed with  her today.  She will think about this and let me know if agreeable.  Scheduled for her f/u mammogram 09/17/13. Missed her appt.  Will reschedule.  Needs to return IFOB.

## 2013-12-30 ENCOUNTER — Encounter: Payer: Self-pay | Admitting: *Deleted

## 2014-01-03 ENCOUNTER — Encounter: Payer: Self-pay | Admitting: Internal Medicine

## 2014-01-03 NOTE — Assessment & Plan Note (Signed)
Followed by Dr Manuella Ghazi.  On Keppra and has done well.  Follow.

## 2014-01-03 NOTE — Assessment & Plan Note (Signed)
S/p excision/craniectomy.  Has done well.  Just has some residual right leg weakness.  Follow.  Continues to follow up with Dr Manuella Ghazi.

## 2014-01-03 NOTE — Assessment & Plan Note (Addendum)
Persistent knee pain.  Has seen ortho as outlined.  Since persistent pain, will refer back to ortho for further evaluation and testing.

## 2014-01-03 NOTE — Assessment & Plan Note (Signed)
Blood pressure doing better.  On HCTZ.   Tolerating.  Will continue the current dose of HCTZ.  Follow pressures.  Follow metabolic panel.

## 2014-01-25 ENCOUNTER — Other Ambulatory Visit: Payer: Self-pay | Admitting: Internal Medicine

## 2014-03-25 ENCOUNTER — Ambulatory Visit (INDEPENDENT_AMBULATORY_CARE_PROVIDER_SITE_OTHER): Payer: Medicare Other

## 2014-03-25 DIAGNOSIS — Z23 Encounter for immunization: Secondary | ICD-10-CM

## 2014-05-03 ENCOUNTER — Ambulatory Visit: Payer: Medicare Other | Admitting: Internal Medicine

## 2014-06-14 ENCOUNTER — Encounter: Payer: Self-pay | Admitting: Neurology

## 2014-06-14 DIAGNOSIS — R2689 Other abnormalities of gait and mobility: Secondary | ICD-10-CM | POA: Diagnosis not present

## 2014-06-14 DIAGNOSIS — R279 Unspecified lack of coordination: Secondary | ICD-10-CM | POA: Diagnosis not present

## 2014-06-14 DIAGNOSIS — M6281 Muscle weakness (generalized): Secondary | ICD-10-CM | POA: Diagnosis not present

## 2014-06-17 DIAGNOSIS — R279 Unspecified lack of coordination: Secondary | ICD-10-CM | POA: Diagnosis not present

## 2014-06-17 DIAGNOSIS — R2689 Other abnormalities of gait and mobility: Secondary | ICD-10-CM | POA: Diagnosis not present

## 2014-06-17 DIAGNOSIS — M6281 Muscle weakness (generalized): Secondary | ICD-10-CM | POA: Diagnosis not present

## 2014-06-21 DIAGNOSIS — M6281 Muscle weakness (generalized): Secondary | ICD-10-CM | POA: Diagnosis not present

## 2014-06-21 DIAGNOSIS — R279 Unspecified lack of coordination: Secondary | ICD-10-CM | POA: Diagnosis not present

## 2014-06-21 DIAGNOSIS — R2689 Other abnormalities of gait and mobility: Secondary | ICD-10-CM | POA: Diagnosis not present

## 2014-06-23 DIAGNOSIS — R2689 Other abnormalities of gait and mobility: Secondary | ICD-10-CM | POA: Diagnosis not present

## 2014-06-23 DIAGNOSIS — M6281 Muscle weakness (generalized): Secondary | ICD-10-CM | POA: Diagnosis not present

## 2014-06-23 DIAGNOSIS — R279 Unspecified lack of coordination: Secondary | ICD-10-CM | POA: Diagnosis not present

## 2014-06-30 DIAGNOSIS — R2689 Other abnormalities of gait and mobility: Secondary | ICD-10-CM | POA: Diagnosis not present

## 2014-06-30 DIAGNOSIS — R279 Unspecified lack of coordination: Secondary | ICD-10-CM | POA: Diagnosis not present

## 2014-06-30 DIAGNOSIS — M6281 Muscle weakness (generalized): Secondary | ICD-10-CM | POA: Diagnosis not present

## 2014-07-07 DIAGNOSIS — Z1211 Encounter for screening for malignant neoplasm of colon: Secondary | ICD-10-CM | POA: Diagnosis not present

## 2014-07-07 DIAGNOSIS — Z01419 Encounter for gynecological examination (general) (routine) without abnormal findings: Secondary | ICD-10-CM | POA: Diagnosis not present

## 2014-07-07 DIAGNOSIS — R87810 Cervical high risk human papillomavirus (HPV) DNA test positive: Secondary | ICD-10-CM | POA: Diagnosis not present

## 2014-07-09 ENCOUNTER — Ambulatory Visit (INDEPENDENT_AMBULATORY_CARE_PROVIDER_SITE_OTHER): Payer: Medicare Other | Admitting: Internal Medicine

## 2014-07-09 ENCOUNTER — Encounter: Payer: Self-pay | Admitting: Internal Medicine

## 2014-07-09 VITALS — BP 126/86 | HR 91 | Temp 98.3°F | Ht 62.0 in | Wt 186.5 lb

## 2014-07-09 DIAGNOSIS — R269 Unspecified abnormalities of gait and mobility: Secondary | ICD-10-CM

## 2014-07-09 DIAGNOSIS — G40909 Epilepsy, unspecified, not intractable, without status epilepticus: Secondary | ICD-10-CM

## 2014-07-09 DIAGNOSIS — Z0000 Encounter for general adult medical examination without abnormal findings: Secondary | ICD-10-CM | POA: Diagnosis not present

## 2014-07-09 DIAGNOSIS — I10 Essential (primary) hypertension: Secondary | ICD-10-CM

## 2014-07-09 MED ORDER — HYDROCHLOROTHIAZIDE 12.5 MG PO CAPS: 12.5000 mg | ORAL_CAPSULE | Freq: Every day | ORAL | Status: DC

## 2014-07-09 NOTE — Progress Notes (Signed)
Pre visit review using our clinic review tool, if applicable. No additional management support is needed unless otherwise documented below in the visit note. 

## 2014-07-11 ENCOUNTER — Encounter: Payer: Self-pay | Admitting: Internal Medicine

## 2014-07-11 DIAGNOSIS — R269 Unspecified abnormalities of gait and mobility: Secondary | ICD-10-CM | POA: Insufficient documentation

## 2014-07-11 NOTE — Assessment & Plan Note (Signed)
Blood pressure doing well on current medication regimen.  Follow metabolic panel.

## 2014-07-11 NOTE — Assessment & Plan Note (Signed)
Working with physical therapy on gait training and strengthening.

## 2014-07-11 NOTE — Progress Notes (Signed)
Patient ID: Rachael Zimmerman, female   DOB: Nov 04, 1960, 54 y.o.   MRN: [id number]    Subjective:    Patient ID: Rachael Zimmerman, female    DOB: 11-Sep-1960, 54 y.o.   MRN: [id number]  HPI  The patient is here for annual Medicare wellness examination and management of other chronic and acute problems.   The risk factors are reflected in the social history.  The roster of all physicians providing medical care to patient -  Dr Ouida Sills - gyn Dr Manuella Ghazi - neurology Dr Gloriann Loan - optometry Dr Zigmund Daniel - Dentist  Activities of daily living:  The patient is 100% independent in all ADLs: dressing, toileting, feeding as well as independent mobility  Home safety : The patient has smoke detectors in the home. She  wears seatbelts.  There are no firearms at home. There is no violence in the home.   There is no risks for hepatitis, STDs or HIV. There is no history of blood transfusion. She has no travel history to infectious disease endemic areas of the world.  The patient has seen her dentist in the last six months. She has seen her eye doctor in the last year. She has not noticed any hearing difficulty with regard to whispered voices.  No ringing in her ears.  She does not  have excessive sun exposure. Discussed the need for sun protection: hats, long sleeves and use of sunscreen if there is significant sun exposure.   Diet: the importance of a healthy diet is discussed.  She does try to watch her diet.    The benefits of regular aerobic exercise were discussed. She plans to get more serious about regular exercise.    Depression screen: there are no signs or vegative symptoms of depression- irritability, change in appetite, anhedonia, sadness/tearfullness.  Cognitive assessment: the patient manages all her financial and personal affairs and is actively engaged. She could relate day,date,year and events; recalled 2/3 objects at 3 minutes.   The following portions of the patient's history were reviewed and  updated as appropriate: allergies, current medications, past family history, past medical history,  past surgical history, past social history  and problem list.  Visual acuity was not assessed since she has regular follow up with her ophthalmologist. Hearing and body mass index were reviewed.   During the course of the visit the patient was educated and counseled about appropriate screening and preventive services including : fall prevention , diabetes screening, nutrition counseling, colorectal cancer screening, and recommended immunizations.     Past Medical History  Diagnosis Date  . Seizures   . Brain tumor (benign)     s/p craniectomy    Outpatient Encounter Prescriptions as of 07/09/2014  Medication Sig  . hydrochlorothiazide (MICROZIDE) 12.5 MG capsule Take 1 capsule (12.5 mg total) by mouth daily.  Marland Kitchen levETIRAcetam (KEPPRA) 500 MG tablet Take 500 mg by mouth 2 (two) times daily.  . [DISCONTINUED] hydrochlorothiazide (MICROZIDE) 12.5 MG capsule TAKE ONE CAPSULE BY MOUTH DAILY.    She reports no recent seizures.  Seeing Dr Manuella Ghazi.  Planning for EEG in 07/2014.  On keppra.  Doing well.  Plans to get more serious about her exercise and diet.  Blood pressure dong well.  She has started back to therapy.  Working on her gait and strengthening exercises.     Review of Systems  Constitutional: Negative for fatigue and unexpected weight change.  HENT: Negative for congestion and sinus pressure.   Respiratory: Negative for cough, chest tightness and  shortness of breath.   Cardiovascular: Negative for chest pain, palpitations and leg swelling.  Gastrointestinal: Negative for nausea, vomiting and abdominal pain.  Neurological: Negative for dizziness, seizures and light-headedness.       Objective:    Physical Exam  Constitutional: No distress.  HENT:  Nose: Nose normal.  Mouth/Throat: Oropharynx is clear and moist.  Neck: Neck supple. No thyromegaly present.  Cardiovascular: Normal  rate and regular rhythm.   Pulmonary/Chest: Breath sounds normal. No respiratory distress. She has no wheezes.  Abdominal: Soft. Bowel sounds are normal. There is no tenderness.  Musculoskeletal: She exhibits no edema or tenderness.  Lymphadenopathy:    She has no cervical adenopathy.    BP 126/86 mmHg  Pulse 91  Temp(Src) 98.3 F (36.8 C) (Oral)  Ht 5\' 2"  (1.575 m)  Wt 186 lb 8 oz (84.596 kg)  BMI 34.10 kg/m2  SpO2 95% Wt Readings from Last 3 Encounters:  07/09/14 186 lb 8 oz (84.596 kg)  12/29/13 176 lb 8 oz (80.06 kg)  08/20/13 185 lb 4 oz (84.029 kg)     Lab Results  Component Value Date   WBC 8.4 12/29/2013   HGB 13.0 12/29/2013   HCT 39.1 12/29/2013   PLT 155.0 12/29/2013   GLUCOSE 103* 12/29/2013   CHOL 162 12/29/2013   TRIG 66.0 12/29/2013   HDL 53.80 12/29/2013   LDLCALC 95 12/29/2013   ALT 26 12/29/2013   AST 21 12/29/2013   NA 137 12/29/2013   K 4.0 12/29/2013   CL 102 12/29/2013   CREATININE 0.9 12/29/2013   BUN 13 12/29/2013   CO2 27 12/29/2013   TSH 1.40 12/29/2013       Assessment & Plan:   This is a routine wellness examination for this patient.  I reviewed all health maintenance protocols including mammography, colonoscopy, bone density.  Needed referrals placed. Age and diagnosis appropriate screening labs were ordered.  Her immunization history was reviewed and appropriate vaccinations were ordered.  Her current medications and allergies were reviewed and refills of her chronic medications were ordered if needed.   The plan for yearly health maintenance was discussed.     MEDICARE ATTESTATION I have personally reviewed:  The patient's medical and social history. The use of alcohol, tobacco and illicit drugs. The current medications and supplements. The patient's function ability including ADLs, fall risks, home safety risks, cognitive, and hearing and visual impairment.   Diet and physical activities. Evaluation for depression and mood  disorders.    The patient's weight, height, BMI have been recorded in the chart.  I have made referrals, counseled and provided education to the patient based on review of the above.  We discussed screening tests and procedures for her age.     Problem List Items Addressed This Visit    Essential hypertension, benign - Primary    Blood pressure doing well on current medication regimen.  Follow metabolic panel.        Relevant Medications   hydrochlorothiazide (MICROZIDE) 12.5 MG capsule   Gait disturbance    Working with physical therapy on gait training and strengthening.        Seizure disorder    On keppra.  No seizures.  Seeing Dr Manuella Ghazi.  Planning for EEG.            Einar Pheasant, MD

## 2014-07-11 NOTE — Assessment & Plan Note (Signed)
On keppra.  No seizures.  Seeing Dr Manuella Ghazi.  Planning for EEG.

## 2014-07-12 ENCOUNTER — Encounter: Payer: Self-pay | Admitting: Neurology

## 2014-07-12 DIAGNOSIS — R2689 Other abnormalities of gait and mobility: Secondary | ICD-10-CM | POA: Diagnosis not present

## 2014-07-12 DIAGNOSIS — R279 Unspecified lack of coordination: Secondary | ICD-10-CM | POA: Diagnosis not present

## 2014-07-12 DIAGNOSIS — M6281 Muscle weakness (generalized): Secondary | ICD-10-CM | POA: Diagnosis not present

## 2014-07-14 DIAGNOSIS — M6281 Muscle weakness (generalized): Secondary | ICD-10-CM | POA: Diagnosis not present

## 2014-07-14 DIAGNOSIS — R279 Unspecified lack of coordination: Secondary | ICD-10-CM | POA: Diagnosis not present

## 2014-07-14 DIAGNOSIS — R2689 Other abnormalities of gait and mobility: Secondary | ICD-10-CM | POA: Diagnosis not present

## 2014-07-22 ENCOUNTER — Ambulatory Visit: Payer: Self-pay | Admitting: Obstetrics and Gynecology

## 2014-07-22 DIAGNOSIS — Z1231 Encounter for screening mammogram for malignant neoplasm of breast: Secondary | ICD-10-CM | POA: Diagnosis not present

## 2014-07-22 LAB — HM MAMMOGRAPHY: HM MAMMO: NEGATIVE

## 2014-07-27 ENCOUNTER — Encounter: Payer: Self-pay | Admitting: Internal Medicine

## 2014-08-06 DIAGNOSIS — G40001 Localization-related (focal) (partial) idiopathic epilepsy and epileptic syndromes with seizures of localized onset, not intractable, with status epilepticus: Secondary | ICD-10-CM | POA: Diagnosis not present

## 2014-08-10 ENCOUNTER — Encounter
Admit: 2014-08-10 | Disposition: A | Discharge status: Home / Self Care | Payer: Self-pay | Attending: Neurology | Admitting: Neurology

## 2014-11-22 ENCOUNTER — Telehealth: Payer: Self-pay | Admitting: *Deleted

## 2014-11-22 ENCOUNTER — Ambulatory Visit: Payer: Medicare Other | Admitting: Internal Medicine

## 2014-11-22 NOTE — Telephone Encounter (Signed)
Noted  

## 2014-11-22 NOTE — Telephone Encounter (Signed)
Since called will hold on charging her a no show fee, but apparently she has canceled at last minute previously.  Needs to make sure she keeps next appt.

## 2014-11-22 NOTE — Telephone Encounter (Signed)
Pt called office at approximately 9:27am this morning to cancel her 9:30 appt. (Per Caryl Pina, pt stated that the "state" just walked in). After researching her appointment history, this is the 4th incident where the patient has called at the last minute, no showed, or came late & had to reschedule. Patient appointment was taken off todays schedule & rescheduled to September. Please advise if patient should be charged a "no show".

## 2015-01-19 ENCOUNTER — Telehealth: Payer: Self-pay | Admitting: Internal Medicine

## 2015-01-19 ENCOUNTER — Other Ambulatory Visit: Payer: Self-pay | Admitting: Internal Medicine

## 2015-01-19 DIAGNOSIS — Z13220 Encounter for screening for lipoid disorders: Secondary | ICD-10-CM

## 2015-01-19 DIAGNOSIS — I10 Essential (primary) hypertension: Secondary | ICD-10-CM

## 2015-01-19 DIAGNOSIS — G40909 Epilepsy, unspecified, not intractable, without status epilepticus: Secondary | ICD-10-CM

## 2015-01-19 NOTE — Progress Notes (Signed)
Orders placed for labs

## 2015-01-19 NOTE — Telephone Encounter (Signed)
Pt states she needs to make a lab appt, No orders are in the pt chart. Thank You!

## 2015-01-19 NOTE — Telephone Encounter (Signed)
I have placed an order for labs.  Please schedule fasting lab within the next week. Thanks.

## 2015-01-19 NOTE — Telephone Encounter (Signed)
Please call her and schedule a lab appt.  She has a f/u appt with me in 02/2015.  I would like to have labs prior.  Ok to refill x one month.

## 2015-01-19 NOTE — Telephone Encounter (Signed)
Last OV 1.29.16, please advise refill

## 2015-01-27 ENCOUNTER — Other Ambulatory Visit (INDEPENDENT_AMBULATORY_CARE_PROVIDER_SITE_OTHER): Payer: Medicare Other

## 2015-01-27 DIAGNOSIS — Z13220 Encounter for screening for lipoid disorders: Secondary | ICD-10-CM

## 2015-01-27 DIAGNOSIS — G40909 Epilepsy, unspecified, not intractable, without status epilepticus: Secondary | ICD-10-CM

## 2015-01-27 DIAGNOSIS — I10 Essential (primary) hypertension: Secondary | ICD-10-CM | POA: Diagnosis not present

## 2015-01-27 LAB — LIPID PANEL
CHOLESTEROL: 163 mg/dL (ref 0–200)
HDL: 51.8 mg/dL (ref >39.00)
LDL Cholesterol: 101 mg/dL — ABNORMAL HIGH (ref 0–99)
NonHDL: 111.09
TRIGLYCERIDES: 48 mg/dL (ref 0.0–149.0)
Total CHOL/HDL Ratio: 3
VLDL: 9.6 mg/dL (ref 0.0–40.0)

## 2015-01-27 LAB — COMPREHENSIVE METABOLIC PANEL
ALBUMIN: 4 g/dL (ref 3.5–5.2)
ALK PHOS: 76 U/L (ref 39–117)
ALT: 20 U/L (ref 0–35)
AST: 15 U/L (ref 0–37)
BILIRUBIN TOTAL: 0.4 mg/dL (ref 0.2–1.2)
BUN: 14 mg/dL (ref 6–23)
CALCIUM: 10.1 mg/dL (ref 8.4–10.5)
CO2: 27 mEq/L (ref 19–32)
Chloride: 108 mEq/L (ref 96–112)
Creatinine, Ser: 0.87 mg/dL (ref 0.40–1.20)
GFR: 87.18 mL/min (ref >60.00)
Glucose, Bld: 104 mg/dL — ABNORMAL HIGH (ref 70–99)
POTASSIUM: 3.8 meq/L (ref 3.5–5.1)
Sodium: 141 mEq/L (ref 135–145)
TOTAL PROTEIN: 7.1 g/dL (ref 6.0–8.3)

## 2015-01-27 LAB — CBC WITH DIFFERENTIAL/PLATELET
Basophils Absolute: 0 10*3/uL (ref 0.0–0.1)
Basophils Relative: 0.4 % (ref 0.0–3.0)
EOS PCT: 1.1 % (ref 0.0–5.0)
Eosinophils Absolute: 0.1 10*3/uL (ref 0.0–0.7)
HEMATOCRIT: 38.1 % (ref 36.0–46.0)
HEMOGLOBIN: 12.7 g/dL (ref 12.0–15.0)
LYMPHS ABS: 2.9 10*3/uL (ref 0.7–4.0)
LYMPHS PCT: 40.4 % (ref 12.0–46.0)
MCHC: 33.2 g/dL (ref 30.0–36.0)
MCV: 87.7 fl (ref 78.0–100.0)
MONOS PCT: 5.5 % (ref 3.0–12.0)
Monocytes Absolute: 0.4 10*3/uL (ref 0.1–1.0)
NEUTROS PCT: 52.6 % (ref 43.0–77.0)
Neutro Abs: 3.8 10*3/uL (ref 1.4–7.7)
Platelets: 159 10*3/uL (ref 150.0–400.0)
RBC: 4.35 Mil/uL (ref 3.87–5.11)
RDW: 14.3 % (ref 11.5–15.5)
WBC: 7.2 10*3/uL (ref 4.0–10.5)

## 2015-01-27 LAB — TSH: TSH: 1.49 u[IU]/mL (ref 0.35–4.50)

## 2015-01-28 ENCOUNTER — Encounter: Payer: Self-pay | Admitting: *Deleted

## 2015-02-10 ENCOUNTER — Encounter: Payer: Self-pay | Admitting: Internal Medicine

## 2015-02-10 ENCOUNTER — Ambulatory Visit (INDEPENDENT_AMBULATORY_CARE_PROVIDER_SITE_OTHER): Payer: Medicare Other | Admitting: Internal Medicine

## 2015-02-10 VITALS — BP 120/80 | HR 76 | Temp 98.4°F | Ht 62.0 in | Wt 180.5 lb

## 2015-02-10 DIAGNOSIS — R232 Flushing: Secondary | ICD-10-CM

## 2015-02-10 DIAGNOSIS — N951 Menopausal and female climacteric states: Secondary | ICD-10-CM

## 2015-02-10 DIAGNOSIS — I10 Essential (primary) hypertension: Secondary | ICD-10-CM | POA: Diagnosis not present

## 2015-02-10 DIAGNOSIS — L989 Disorder of the skin and subcutaneous tissue, unspecified: Secondary | ICD-10-CM

## 2015-02-10 DIAGNOSIS — D496 Neoplasm of unspecified behavior of brain: Secondary | ICD-10-CM

## 2015-02-10 DIAGNOSIS — G40909 Epilepsy, unspecified, not intractable, without status epilepticus: Secondary | ICD-10-CM

## 2015-02-10 MED ORDER — HYDROCHLOROTHIAZIDE 12.5 MG PO CAPS: ORAL_CAPSULE | ORAL | Status: DC

## 2015-02-10 MED ORDER — TRIAMCINOLONE ACETONIDE 0.1 % EX CREA: 1.0000 "application " | TOPICAL_CREAM | Freq: Two times a day (BID) | CUTANEOUS | Status: DC

## 2015-02-10 NOTE — Progress Notes (Signed)
Patient ID: Rachael Zimmerman, female   DOB: 1960/07/24, 54 y.o.   MRN: [id number]   Subjective:    Patient ID: Rachael Zimmerman, female    DOB: [birth year], 54 y.o.   MRN: [id number]  HPI  Patient here for a scheduled follow up.  Has started having hot flashes.  May occur 1-2x/day.  Is managing.  Discussed treatment options.  Will follow.  Blood pressure has been doing well.  Sees gyn.  Up to date.  Tries to stay active.  No chest pain or tightness.  No sob.  No acid reflux reported.  No abdominal pain.  Bowels stable.     Past Medical History  Diagnosis Date  . Seizures   . Brain tumor (benign)     s/p craniectomy   Past Surgical History  Procedure Laterality Date  . Brain surgery      tumor excision s/p craniectomy  . Abdominal hysterectomy  2010    ovaries not removed  . Breast biopsy  2012   Family History  Problem Relation Age of Onset  . Cancer Mother     ovary  . Breast cancer Neg Hx   . Colon cancer Neg Hx    Social History   Social History  . Marital Status: Single    Spouse Name: N/A  . Number of Children: 1  . Years of Education: N/A   Occupational History  .     Social History Main Topics  . Smoking status: Never Smoker   . Smokeless tobacco: Never Used  . Alcohol Use: No  . Drug Use: No  . Sexual Activity: Not Asked   Other Topics Concern  . None   Social History Narrative    Outpatient Encounter Prescriptions as of 02/10/2015  Medication Sig  . hydrochlorothiazide (MICROZIDE) 12.5 MG capsule TAKE 1 CAPSULE (12.5 MG TOTAL) BY MOUTH DAILY.  . [DISCONTINUED] hydrochlorothiazide (MICROZIDE) 12.5 MG capsule TAKE 1 CAPSULE (12.5 MG TOTAL) BY MOUTH DAILY.  Marland Kitchen triamcinolone cream (KENALOG) 0.1 % Apply 1 application topically 2 (two) times daily.  . [DISCONTINUED] levETIRAcetam (KEPPRA) 500 MG tablet Take 500 mg by mouth 2 (two) times daily.   No facility-administered encounter medications on file as of 02/10/2015.    Review of Systems  Constitutional: Negative  for appetite change and unexpected weight change.  HENT: Negative for congestion and sinus pressure.   Eyes: Negative for discharge and visual disturbance.  Respiratory: Negative for cough, chest tightness and shortness of breath.   Cardiovascular: Negative for chest pain, palpitations and leg swelling.  Gastrointestinal: Negative for nausea, vomiting, abdominal pain and diarrhea.  Genitourinary: Negative for dysuria and difficulty urinating.  Musculoskeletal: Negative for back pain and joint swelling.  Skin: Negative for color change and rash.  Neurological: Negative for dizziness, light-headedness and headaches.  Psychiatric/Behavioral: Negative for dysphoric mood and agitation.       Objective:     Blood pressure rechecked by me:  124/80  Physical Exam  Constitutional: She appears well-developed and well-nourished. No distress.  HENT:  Nose: Nose normal.  Mouth/Throat: Oropharynx is clear and moist.  Eyes: Conjunctivae are normal. Right eye exhibits no discharge. Left eye exhibits no discharge.  Neck: Neck supple. No thyromegaly present.  Cardiovascular: Normal rate and regular rhythm.   Pulmonary/Chest: Breath sounds normal. No respiratory distress. She has no wheezes.  Abdominal: Soft. Bowel sounds are normal. There is no tenderness.  Musculoskeletal: She exhibits no edema or tenderness.  Lymphadenopathy:    She has no cervical  adenopathy.  Skin: No rash noted. No erythema.  Psychiatric: She has a normal mood and affect. Her behavior is normal.    BP 120/80 mmHg  Pulse 76  Temp(Src) 98.4 F (36.9 C) (Oral)  Ht 5\' 2"  (1.575 m)  Wt 180 lb 8 oz (81.874 kg)  BMI 33.01 kg/m2  SpO2 98% Wt Readings from Last 3 Encounters:  02/10/15 180 lb 8 oz (81.874 kg)  07/09/14 186 lb 8 oz (84.596 kg)  12/29/13 176 lb 8 oz (80.06 kg)     Lab Results  Component Value Date   WBC 7.2 01/27/2015   HGB 12.7 01/27/2015   HCT 38.1 01/27/2015   PLT 159.0 01/27/2015   GLUCOSE 104*  01/27/2015   CHOL 163 01/27/2015   TRIG 48.0 01/27/2015   HDL 51.80 01/27/2015   LDLCALC 101* 01/27/2015   ALT 20 01/27/2015   AST 15 01/27/2015   NA 141 01/27/2015   K 3.8 01/27/2015   CL 108 01/27/2015   CREATININE 0.87 01/27/2015   BUN 14 01/27/2015   CO2 27 01/27/2015   TSH 1.49 01/27/2015       Assessment & Plan:   Problem List Items Addressed This Visit    Brain tumor    S/p excision/craniectomy.  Some residual right leg weakness.  Continue to f/u with Dr Manuella Ghazi.        Essential hypertension, benign - Primary    Blood pressure under good control.  Continue same medication regimen.  Follow pressures.  Follow metabolic panel.        Relevant Medications   hydrochlorothiazide (MICROZIDE) 12.5 MG capsule   Hot flashes    Some occasional hot flashes.  Discussed treatment options.  Will follow.        Seizure disorder    Off keppra.  Doing well.  Follow.        Skin lesion of left arm    Skin lesions of left arm.  Apply to affected area bid.  Call if persistent.           Einar Pheasant, MD

## 2015-02-10 NOTE — Progress Notes (Signed)
Pre-visit discussion using our clinic review tool. No additional management support is needed unless otherwise documented below in the visit note.  

## 2015-02-14 ENCOUNTER — Encounter: Payer: Self-pay | Admitting: Internal Medicine

## 2015-02-14 DIAGNOSIS — L989 Disorder of the skin and subcutaneous tissue, unspecified: Secondary | ICD-10-CM | POA: Insufficient documentation

## 2015-02-14 DIAGNOSIS — R232 Flushing: Secondary | ICD-10-CM | POA: Insufficient documentation

## 2015-02-14 NOTE — Assessment & Plan Note (Signed)
Off keppra.  Doing well.  Follow.

## 2015-02-14 NOTE — Assessment & Plan Note (Signed)
S/p excision/craniectomy.  Some residual right leg weakness.  Continue to f/u with Dr Manuella Ghazi.

## 2015-02-14 NOTE — Assessment & Plan Note (Addendum)
Skin lesions of left arm.  Apply to affected area bid.  Call if persistent.

## 2015-02-14 NOTE — Assessment & Plan Note (Signed)
Some occasional hot flashes.  Discussed treatment options.  Will follow.

## 2015-02-14 NOTE — Assessment & Plan Note (Signed)
Blood pressure under good control.  Continue same medication regimen.  Follow pressures.  Follow metabolic panel.   

## 2015-02-21 ENCOUNTER — Other Ambulatory Visit: Payer: Self-pay | Admitting: Internal Medicine

## 2015-05-10 ENCOUNTER — Telehealth: Payer: Self-pay | Admitting: Internal Medicine

## 2015-05-10 NOTE — Telephone Encounter (Signed)
Left msg to call office to schedule flu shot/msn °

## 2015-05-24 DIAGNOSIS — G40009 Localization-related (focal) (partial) idiopathic epilepsy and epileptic syndromes with seizures of localized onset, not intractable, without status epilepticus: Secondary | ICD-10-CM | POA: Diagnosis not present

## 2015-05-24 DIAGNOSIS — Z87898 Personal history of other specified conditions: Secondary | ICD-10-CM | POA: Diagnosis not present

## 2015-05-24 DIAGNOSIS — E669 Obesity, unspecified: Secondary | ICD-10-CM | POA: Diagnosis not present

## 2015-05-24 DIAGNOSIS — G8191 Hemiplegia, unspecified affecting right dominant side: Secondary | ICD-10-CM | POA: Diagnosis not present

## 2015-07-12 ENCOUNTER — Other Ambulatory Visit: Payer: Self-pay | Admitting: Obstetrics and Gynecology

## 2015-07-12 DIAGNOSIS — Z01411 Encounter for gynecological examination (general) (routine) with abnormal findings: Secondary | ICD-10-CM | POA: Diagnosis not present

## 2015-07-12 DIAGNOSIS — Z1211 Encounter for screening for malignant neoplasm of colon: Secondary | ICD-10-CM | POA: Diagnosis not present

## 2015-07-12 DIAGNOSIS — Z1231 Encounter for screening mammogram for malignant neoplasm of breast: Secondary | ICD-10-CM

## 2015-07-12 DIAGNOSIS — N952 Postmenopausal atrophic vaginitis: Secondary | ICD-10-CM | POA: Diagnosis not present

## 2015-07-25 ENCOUNTER — Other Ambulatory Visit: Payer: Self-pay | Admitting: Obstetrics and Gynecology

## 2015-07-25 ENCOUNTER — Ambulatory Visit
Admission: RE | Admit: 2015-07-25 | Discharge: 2015-07-25 | Disposition: A | Discharge status: Home / Self Care | Payer: Medicare Other | Source: Ambulatory Visit | Attending: Obstetrics and Gynecology | Admitting: Obstetrics and Gynecology

## 2015-07-25 DIAGNOSIS — Z1231 Encounter for screening mammogram for malignant neoplasm of breast: Secondary | ICD-10-CM

## 2015-08-10 ENCOUNTER — Ambulatory Visit (INDEPENDENT_AMBULATORY_CARE_PROVIDER_SITE_OTHER): Payer: Medicare Other | Admitting: Internal Medicine

## 2015-08-10 ENCOUNTER — Encounter: Payer: Self-pay | Admitting: Internal Medicine

## 2015-08-10 VITALS — BP 120/80 | HR 79 | Temp 98.0°F | Resp 18 | Ht 62.0 in | Wt 185.0 lb

## 2015-08-10 DIAGNOSIS — D496 Neoplasm of unspecified behavior of brain: Secondary | ICD-10-CM | POA: Diagnosis not present

## 2015-08-10 DIAGNOSIS — Z0000 Encounter for general adult medical examination without abnormal findings: Secondary | ICD-10-CM

## 2015-08-10 DIAGNOSIS — G40909 Epilepsy, unspecified, not intractable, without status epilepticus: Secondary | ICD-10-CM | POA: Diagnosis not present

## 2015-08-10 DIAGNOSIS — I10 Essential (primary) hypertension: Secondary | ICD-10-CM

## 2015-08-10 DIAGNOSIS — R739 Hyperglycemia, unspecified: Secondary | ICD-10-CM | POA: Diagnosis not present

## 2015-08-10 LAB — BASIC METABOLIC PANEL
BUN: 14 mg/dL (ref 6–23)
CHLORIDE: 105 meq/L (ref 96–112)
CO2: 28 mEq/L (ref 19–32)
CREATININE: 0.89 mg/dL (ref 0.40–1.20)
Calcium: 10.2 mg/dL (ref 8.4–10.5)
GFR: 84.75 mL/min (ref >60.00)
GLUCOSE: 102 mg/dL — AB (ref 70–99)
Potassium: 3.9 mEq/L (ref 3.5–5.1)
Sodium: 139 mEq/L (ref 135–145)

## 2015-08-10 LAB — HEMOGLOBIN A1C: Hgb A1c MFr Bld: 6.1 % (ref 4.6–6.5)

## 2015-08-10 NOTE — Progress Notes (Signed)
Patient ID: Rachael Zimmerman, female   DOB: 06/28/1960, 55 y.o.   MRN: [id number]   Subjective:    Patient ID: Rachael Zimmerman, female    DOB: August 06, 1960, 55 y.o.   MRN: [id number]  HPI  Patient with past history of a brain tumor, seizure disorder and hypertension.  She is followed by Dr Manuella Ghazi.  Comes in today for a scheduled follow up.  She is doing well.  Feels things are stable.  A little stuffy nose and drainage.  No fever.  No chest congestion or cough.  No sob.  No acid reflux.  No abdominal pain or cramping.  Bowels stable.  Discussed diet and exercise.     Past Medical History  Diagnosis Date  . Seizures ([place])   . Brain tumor (benign) [facility])     s/p craniectomy   Past Surgical History  Procedure Laterality Date  . Brain surgery      tumor excision s/p craniectomy  . Abdominal hysterectomy  2010    ovaries not removed  . Breast biopsy Left 2010    benign   Family History  Problem Relation Age of Onset  . Cancer Mother     ovary  . Breast cancer Neg Hx   . Colon cancer Neg Hx    Social History   Social History  . Marital Status: Single    Spouse Name: N/A  . Number of Children: 1  . Years of Education: N/A   Occupational History  .     Social History Main Topics  . Smoking status: Never Smoker   . Smokeless tobacco: Never Used  . Alcohol Use: No  . Drug Use: No  . Sexual Activity: Not Asked   Other Topics Concern  . None   Social History Narrative    Outpatient Encounter Prescriptions as of 08/10/2015  Medication Sig  . hydrochlorothiazide (MICROZIDE) 12.5 MG capsule TAKE 1 CAPSULE (12.5 MG TOTAL) BY MOUTH DAILY.  . [DISCONTINUED] hydrochlorothiazide (MICROZIDE) 12.5 MG capsule TAKE 1 CAPSULE (12.5 MG TOTAL) BY MOUTH DAILY.  . [DISCONTINUED] triamcinolone cream (KENALOG) 0.1 % Apply 1 application topically 2 (two) times daily.   No facility-administered encounter medications on file as of 08/10/2015.    Review of Systems  Constitutional: Negative for appetite  change and unexpected weight change.  HENT: Positive for congestion and postnasal drip. Negative for sinus pressure.   Respiratory: Negative for cough, chest tightness and shortness of breath.   Cardiovascular: Negative for chest pain, palpitations and leg swelling.  Gastrointestinal: Negative for nausea, vomiting, abdominal pain and diarrhea.  Genitourinary: Negative for dysuria and difficulty urinating.  Musculoskeletal: Negative for back pain and joint swelling.  Skin: Negative for color change and rash.  Neurological: Negative for dizziness, light-headedness and headaches.  Psychiatric/Behavioral: Negative for dysphoric mood and agitation.       Objective:     Blood pressure rechecked by me:  122/74  Physical Exam  Constitutional: She appears well-developed and well-nourished. No distress.  HENT:  Nose: Nose normal.  Mouth/Throat: Oropharynx is clear and moist.  Neck: Neck supple. No thyromegaly present.  Cardiovascular: Normal rate and regular rhythm.   Pulmonary/Chest: Breath sounds normal. No respiratory distress. She has no wheezes.  Abdominal: Soft. Bowel sounds are normal. There is no tenderness.  Musculoskeletal: She exhibits no edema or tenderness.  Lymphadenopathy:    She has no cervical adenopathy.  Skin: No rash noted. No erythema.  Psychiatric: She has a normal mood and affect. Her behavior is normal.  BP 120/80 mmHg  Pulse 79  Temp(Src) 98 F (36.7 C) (Oral)  Resp 18  Ht 5' 2"  (1.575 m)  Wt 185 lb (83.915 kg)  BMI 33.83 kg/m2  SpO2 98% Wt Readings from Last 3 Encounters:  08/10/15 185 lb (83.915 kg)  02/10/15 180 lb 8 oz (81.874 kg)  07/09/14 186 lb 8 oz (84.596 kg)     Lab Results  Component Value Date   WBC 7.2 01/27/2015   HGB 12.7 01/27/2015   HCT 38.1 01/27/2015   PLT 159.0 01/27/2015   GLUCOSE 102* 08/10/2015   CHOL 163 01/27/2015   TRIG 48.0 01/27/2015   HDL 51.80 01/27/2015   LDLCALC 101* 01/27/2015   ALT 20 01/27/2015   AST 15  01/27/2015   NA 139 08/10/2015   K 3.9 08/10/2015   CL 105 08/10/2015   CREATININE 0.89 08/10/2015   BUN 14 08/10/2015   CO2 28 08/10/2015   TSH 1.49 01/27/2015   HGBA1C 6.1 08/10/2015    Mm Screening Breast Tomo Bilateral  07/25/2015  CLINICAL DATA:  Screening. EXAM: DIGITAL SCREENING BILATERAL MAMMOGRAM WITH 3D TOMO WITH CAD COMPARISON:  Previous exam(s). ACR Breast Density Category b: There are scattered areas of fibroglandular density. FINDINGS: There are no findings suspicious for malignancy. Images were processed with CAD. IMPRESSION: No mammographic evidence of malignancy. A result letter of this screening mammogram will be mailed directly to the patient. RECOMMENDATION: Screening mammogram in one year. (Code:SM-B-01Y) BI-RADS CATEGORY  1: Negative. Electronically Signed   By: Lajean Manes M.D.   On: 07/25/2015 10:54       Assessment & Plan:   Problem List Items Addressed This Visit    Brain tumor [facility])    S/p excision/craniectomy.  Some residual right leg weakness.  Continue f/u with Dr Manuella Ghazi.        Essential hypertension, benign - Primary    Blood pressure under good control.  Continue same medication regimen.  Follow pressures.  Follow metabolic panel.        Relevant Orders   Basic metabolic panel (Completed)   Health care maintenance    Had mammogram 07/25/15 - Birads I.  Saw gyn 07/12/15.  Discussed cologuard.        Hyperglycemia    Low carb diet and exercise.  Follow met b and a1c.       Relevant Orders   Hemoglobin A1c (Completed)   Seizure disorder ([facility])    Doing well off keppra.            Einar Pheasant, MD

## 2015-08-10 NOTE — Progress Notes (Signed)
Pre-visit discussion using our clinic review tool. No additional management support is needed unless otherwise documented below in the visit note.  

## 2015-08-11 ENCOUNTER — Encounter: Payer: Self-pay | Admitting: *Deleted

## 2015-08-12 ENCOUNTER — Other Ambulatory Visit: Payer: Self-pay | Admitting: Internal Medicine

## 2015-08-15 ENCOUNTER — Other Ambulatory Visit: Payer: Self-pay | Admitting: Internal Medicine

## 2015-08-22 ENCOUNTER — Encounter: Payer: Self-pay | Admitting: Internal Medicine

## 2015-08-22 DIAGNOSIS — Z0000 Encounter for general adult medical examination without abnormal findings: Secondary | ICD-10-CM | POA: Insufficient documentation

## 2015-08-22 NOTE — Assessment & Plan Note (Signed)
S/p excision/craniectomy.  Some residual right leg weakness.  Continue f/u with Dr Manuella Ghazi.

## 2015-08-22 NOTE — Assessment & Plan Note (Signed)
Had mammogram 07/25/15 - Birads I.  Saw gyn 07/12/15.  Discussed cologuard.

## 2015-08-22 NOTE — Assessment & Plan Note (Signed)
Doing well off keppra.   

## 2015-08-22 NOTE — Assessment & Plan Note (Signed)
Low carb diet and exercise.  Follow met b and a1c.  

## 2015-08-22 NOTE — Assessment & Plan Note (Signed)
Blood pressure under good control.  Continue same medication regimen.  Follow pressures.  Follow metabolic panel.   

## 2015-09-12 ENCOUNTER — Other Ambulatory Visit: Payer: Self-pay | Admitting: Internal Medicine

## 2016-02-10 ENCOUNTER — Ambulatory Visit: Payer: Medicare Other | Admitting: Internal Medicine

## 2016-02-10 DIAGNOSIS — Z0289 Encounter for other administrative examinations: Secondary | ICD-10-CM

## 2016-03-09 ENCOUNTER — Ambulatory Visit: Payer: Medicare Other

## 2016-05-01 ENCOUNTER — Ambulatory Visit (INDEPENDENT_AMBULATORY_CARE_PROVIDER_SITE_OTHER): Payer: Medicare Other | Admitting: Internal Medicine

## 2016-05-01 ENCOUNTER — Encounter: Payer: Self-pay | Admitting: Internal Medicine

## 2016-05-01 VITALS — BP 130/84 | HR 89 | Temp 98.2°F | Ht 62.0 in | Wt 184.4 lb

## 2016-05-01 DIAGNOSIS — Z23 Encounter for immunization: Secondary | ICD-10-CM

## 2016-05-01 DIAGNOSIS — R739 Hyperglycemia, unspecified: Secondary | ICD-10-CM | POA: Diagnosis not present

## 2016-05-01 DIAGNOSIS — I10 Essential (primary) hypertension: Secondary | ICD-10-CM | POA: Diagnosis not present

## 2016-05-01 DIAGNOSIS — G40909 Epilepsy, unspecified, not intractable, without status epilepticus: Secondary | ICD-10-CM

## 2016-05-01 LAB — BASIC METABOLIC PANEL
BUN: 15 mg/dL (ref 6–23)
CHLORIDE: 103 meq/L (ref 96–112)
CO2: 30 meq/L (ref 19–32)
Calcium: 10.5 mg/dL (ref 8.4–10.5)
Creatinine, Ser: 0.91 mg/dL (ref 0.40–1.20)
GFR: 82.39 mL/min (ref >60.00)
Glucose, Bld: 90 mg/dL (ref 70–99)
POTASSIUM: 3.8 meq/L (ref 3.5–5.1)
Sodium: 139 mEq/L (ref 135–145)

## 2016-05-01 LAB — HEMOGLOBIN A1C: HEMOGLOBIN A1C: 6.1 % (ref 4.6–6.5)

## 2016-05-01 MED ORDER — HYDROCHLOROTHIAZIDE 12.5 MG PO CAPS: 12.5000 mg | ORAL_CAPSULE | Freq: Every day | ORAL | 5 refills | Status: DC

## 2016-05-01 NOTE — Progress Notes (Signed)
Pre visit review using our clinic review tool, if applicable. No additional management support is needed unless otherwise documented below in the visit note. 

## 2016-05-01 NOTE — Progress Notes (Signed)
Patient ID: Rachael Zimmerman, female   DOB: 01-16-1961, 55 y.o.   MRN: [id number]   Subjective:    Patient ID: Rachael Zimmerman, female    DOB: June 14, 1960, 55 y.o.   MRN: [id number]  HPI  Patient here for a scheduled follow up.  She reports she is doing well.  Feels good.  Handling stress.  No chest pain.  No sob.  No acid reflux.  No abdominal pain or cramping.  Bowels stable.  States her blood pressure is averaging 135-140/70s.     Past Medical History:  Diagnosis Date  . 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[place])    s/p craniectomy  . Seizures ([place])    Past Surgical History:  Procedure Laterality Date  . ABDOMINAL HYSTERECTOMY  2010   ovaries not removed  . BRAIN SURGERY     tumor excision s/p craniectomy  . BREAST BIOPSY Left 2010   benign   Family History  Problem Relation Age of Onset  . Cancer Mother     ovary  . Breast cancer Neg Hx   . Colon cancer Neg Hx    Social History   Social History  . Marital status: Single    Spouse name: N/A  . Number of children: 1  . Years of education: N/A   Occupational History  .  [employer]   Social History Main Topics  . Smoking status: Never Smoker  . Smokeless tobacco: Never Used  . Alcohol use No  . Drug use: No  . Sexual activity: Not Asked   Other Topics Concern  . None   Social History Narrative  . None    Outpatient Encounter Prescriptions as of 05/01/2016  Medication Sig  . hydrochlorothiazide (MICROZIDE) 12.5 MG capsule Take 1 capsule (12.5 mg total) by mouth daily.  . [DISCONTINUED] hydrochlorothiazide (MICROZIDE) 12.5 MG capsule TAKE 1 CAPSULE (12.5 MG TOTAL) BY MOUTH DAILY.   No facility-administered encounter medications on file as of 05/01/2016.     Review of Systems  Constitutional: Negative for appetite change and unexpected weight change.  HENT: Negative for congestion and sinus pressure.   Respiratory: Negative for cough, chest tightness and shortness of breath.   Cardiovascular: Negative for  chest pain, palpitations and leg swelling.  Gastrointestinal: Negative for abdominal pain, diarrhea, nausea and vomiting.  Genitourinary: Negative for difficulty urinating and dysuria.  Musculoskeletal: Negative for back pain and joint swelling.  Skin: Negative for color change and rash.  Neurological: Negative for dizziness, light-headedness and headaches.  Psychiatric/Behavioral: Negative for behavioral problems and dysphoric mood.       Objective:     Blood pressure rechecked by me:  132/72  Physical Exam  Constitutional: She appears well-developed and well-nourished. No distress.  HENT:  Nose: Nose normal.  Mouth/Throat: Oropharynx is clear and moist.  Neck: Neck supple. No thyromegaly present.  Cardiovascular: Normal rate and regular rhythm.   Pulmonary/Chest: Breath sounds normal. No respiratory distress. She has no wheezes.  Abdominal: Soft. Bowel sounds are normal. There is no tenderness.  Musculoskeletal: She exhibits no edema or tenderness.  Lymphadenopathy:    She has no cervical adenopathy.  Skin: No rash noted. No erythema.  Psychiatric: She has a normal mood and affect. Her behavior is normal.    BP 130/84   Pulse 89   Temp 98.2 F (36.8 C) (Oral)   Ht 5' 2" (1.575 m)   Wt 184 lb 6.4 oz (83.6 kg)   SpO2 98%   BMI 33.73 kg/m  Wt Readings from Last 3 Encounters:  05/01/16 184 lb 6.4 oz (83.6 kg)  08/10/15 185 lb (83.9 kg)  02/10/15 180 lb 8 oz (81.9 kg)     Lab Results  Component Value Date   WBC 7.2 01/27/2015   HGB 12.7 01/27/2015   HCT 38.1 01/27/2015   PLT 159.0 01/27/2015   GLUCOSE 90 05/01/2016   CHOL 163 01/27/2015   TRIG 48.0 01/27/2015   HDL 51.80 01/27/2015   LDLCALC 101 (H) 01/27/2015   ALT 20 01/27/2015   AST 15 01/27/2015   NA 139 05/01/2016   K 3.8 05/01/2016   CL 103 05/01/2016   CREATININE 0.91 05/01/2016   BUN 15 05/01/2016   CO2 30 05/01/2016   TSH 1.49 01/27/2015   HGBA1C 6.1 05/01/2016    Mm Screening Breast Tomo  Bilateral  Result Date: 07/25/2015 CLINICAL DATA:  Screening. EXAM: DIGITAL SCREENING BILATERAL MAMMOGRAM WITH 3D TOMO WITH CAD COMPARISON:  Previous exam(s). ACR Breast Density Category b: There are scattered areas of fibroglandular density. FINDINGS: There are no findings suspicious for malignancy. Images were processed with CAD. IMPRESSION: No mammographic evidence of malignancy. A result letter of this screening mammogram will be mailed directly to the patient. RECOMMENDATION: Screening mammogram in one year. (Code:SM-B-01Y) BI-RADS CATEGORY  1: Negative. Electronically Signed   By: David  Ormond M.D.   On: 07/25/2015 10:54       Assessment & Plan:   Problem List Items Addressed This Visit    Essential hypertension, benign    Blood pressure under good control.  Continue same medication regimen.  Follow pressures.  Follow metabolic panel.        Relevant Medications   hydrochlorothiazide (MICROZIDE) 12.5 MG capsule   Other Relevant Orders   Basic metabolic panel (Completed)   Hyperglycemia - Primary    Low carb diet and exercise.  Follow met b and a1c.       Relevant Orders   Hemoglobin A1c (Completed)   Seizure disorder ([facility])    Doing well.  Off keppra.  Follow.        Other Visit Diagnoses    Encounter for immunization       Relevant Medications   hydrochlorothiazide (MICROZIDE) 12.5 MG capsule   Other Relevant Orders   Flu Vaccine QUAD 36+ mos IM (Completed)       SCOTT, CHARLENE, MD  

## 2016-05-05 ENCOUNTER — Encounter: Payer: Self-pay | Admitting: Internal Medicine

## 2016-05-05 NOTE — Assessment & Plan Note (Signed)
Blood pressure under good control.  Continue same medication regimen.  Follow pressures.  Follow metabolic panel.   

## 2016-05-05 NOTE — Assessment & Plan Note (Signed)
Doing well.  Off keppra.  Follow.   

## 2016-05-05 NOTE — Assessment & Plan Note (Signed)
Low carb diet and exercise.  Follow met b and a1c.  

## 2016-06-08 ENCOUNTER — Telehealth: Payer: Self-pay | Admitting: Internal Medicine

## 2016-06-08 ENCOUNTER — Encounter (HOSPITAL_COMMUNITY): Payer: Self-pay | Admitting: Emergency Medicine

## 2016-06-08 ENCOUNTER — Ambulatory Visit (HOSPITAL_COMMUNITY)
Admission: EM | Admit: 2016-06-08 | Discharge: 2016-06-08 | Disposition: A | Discharge status: Home / Self Care | Payer: Medicare Other | Attending: Family Medicine | Admitting: Family Medicine

## 2016-06-08 ENCOUNTER — Ambulatory Visit (INDEPENDENT_AMBULATORY_CARE_PROVIDER_SITE_OTHER): Payer: Medicare Other

## 2016-06-08 DIAGNOSIS — S82891A Other fracture of right lower leg, initial encounter for closed fracture: Secondary | ICD-10-CM

## 2016-06-08 DIAGNOSIS — M7989 Other specified soft tissue disorders: Secondary | ICD-10-CM | POA: Diagnosis not present

## 2016-06-08 DIAGNOSIS — M25471 Effusion, right ankle: Secondary | ICD-10-CM | POA: Diagnosis not present

## 2016-06-08 NOTE — Discharge Instructions (Signed)
Keep walking boot on. Ice pack 10-15 minutes every 4-6 hours x 48 hours. Keep leg elevated. Tylenol/Motrin for pain as needed. FU with Dr Stann Mainland at [place].

## 2016-06-08 NOTE — Telephone Encounter (Signed)
Noted, holding till team health note. thanks

## 2016-06-08 NOTE — ED Provider Notes (Signed)
CSN: [id number]     Arrival date & time 06/08/16  1736 History   First MD Initiated Contact with Patient 06/08/16 1746     Chief Complaint  Patient presents with  . Ankle Pain   (Consider location/radiation/quality/duration/timing/severity/associated sxs/prior Treatment) HPI: 55 yo female presented with sudden onset of right ankle pain and swelling x 1day. Denies injury/trauma. Decrease ROM with flexion/extension. 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  Past Medical History:  Diagnosis Date  . 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[place])    s/p craniectomy  . Seizures ([place])    Past Surgical History:  Procedure Laterality Date  . ABDOMINAL HYSTERECTOMY  2010   ovaries not removed  . BRAIN SURGERY     tumor excision s/p craniectomy  . BREAST BIOPSY Left 2010   benign   Family History  Problem Relation Age of Onset  . Cancer Mother     ovary  . Breast cancer Neg Hx   . Colon cancer Neg Hx    Social History  Substance Use Topics  . Smoking status: Never Smoker  . Smokeless tobacco: Never Used  . Alcohol use No   OB History    No data available     Review of Systems  Musculoskeletal: Positive for joint swelling.  Skin: Negative for color change and wound.  Pain , swelling to right ankle and heel. Pain worse with walking and weight bearing.  Allergies  Penicillins  Home Medications   Prior to Admission medications   Medication Sig Start Date End Date Taking? Authorizing Provider  hydrochlorothiazide (MICROZIDE) 12.5 MG capsule Take 1 capsule (12.5 mg total) by mouth daily. 05/01/16  Yes Einar Pheasant, MD   Meds Ordered and Administered this Visit  Medications - No data to display  BP 149/73 (BP Location: Right Arm)   Pulse 99   Temp 98.5 F (36.9 C) (Oral)   Resp 16   SpO2 99%  No data found.   Physical Exam  Constitutional: She appears well-developed and well-nourished.   Musculoskeletal: She exhibits edema and tenderness.  Skin: Skin is warm. Capillary refill takes less than 2 seconds. No erythema.  Swelling to medial and lateral aspect of right ankle.Tender to palpation more to medial aspect and heel.No visible bruising/ecchymosis appreciated. Pedal pulses Intact < 2sec. Decrease ROM with flexion and extension. Pt reports unable to hyperflex and hyperextend since brain surgery in 2003 Sensation Intact. Strength equal B/L in Upper extremities. Mild weakness in Right leg and foot. . Gait ataxic. Speech clear  Urgent Care Course   Clinical Course     Procedures (including critical care time)  Labs Review Labs Reviewed - No data to display  Imaging Review Dg Ankle Complete Right  Result Date: 06/08/2016 CLINICAL DATA:  Patient states that she started having pain in her rt ankle yesterday, NKI. Some swelling with pain localized on medial side of ankle. Patient unable to give any flexion, very limited rom. EXAM: RIGHT ANKLE - COMPLETE 3+ VIEW COMPARISON:  08/11/2010. FINDINGS: There is a fracture of the medial malleolus which is new from comparison exam. Fracture does appear partially corticated which could indicate subacute fracture. Ankle mortise intact. Subchondral lucency noted along the talar dome paralleling the lateral ankle mortise. Small joint effusion anterior to the tibiotalar joint. IMPRESSION: 1. Concern for subacute fracture of the medial malleolus. 2. Potential subchondral defect on medial margin of the talus. Consider nonemergent MRI  for evaluation chronic injury. Electronically Signed   By: Suzy Bouchard M.D.   On: 06/08/2016 18:51     Visual Acuity Review  Right Eye Distance:   Left Eye Distance:   Bilateral Distance:    Right Eye Near:   Left Eye Near:    Bilateral Near:         MDM   1. Closed fracture of right ankle, initial encounter    Fracture of medial malleolus. Findings discussed with patient. Recommendations to Keep  walking boot on. Ice pack 10-15 minutes every 4-6 hours x 48 hours. Keep leg elevated. Tylenol/Motrin for pain as needed. FU with Dr Stann Mainland at [place]. Radiology read and pic of XR provided to patient.     Erionna Strum, NP 06/08/16 1921    Kalana Yust Jeanett Schlein, NP 06/08/16 1933

## 2016-06-08 NOTE — Telephone Encounter (Signed)
See team health note. 

## 2016-06-08 NOTE — Telephone Encounter (Signed)
[facility] Patient Name: Rachael Zimmerman DOB: 12-27-1960 Initial Comment Caller states that she has a left ankle swelling and unable to put weight on it. No injury. Nurse Assessment Nurse: Martyn Ehrich, RN, Felicia Date/Time (Eastern Time): 06/08/2016 4:11:03 PM Confirm and document reason for call. If symptomatic, describe symptoms. ---PT has L ankle swelling (not the foot) onset yesterday and no injury. She can bear wt but it but ankle painful. No fever Does the patient have any new or worsening symptoms? ---Yes Will a triage be completed? ---Yes Related visit to physician within the last 2 weeks? ---No Does the PT have any chronic conditions? (i.e. diabetes, asthma, etc.) ---YesList chronic conditions. ---HTN Is this a behavioral health or substance abuse call? ---No Guidelines Guideline Title Affirmed Question Affirmed Notes Ankle Swelling [1] SEVERE pain (e.g., excruciating, unable to walk) AND [2] not improved after 2 hours of pain medicine Final Disposition User See Physician within 4 Hours (or PCP triage) Martyn Ehrich, RN, Felicia Comments she does take a BP pill but it is lowest dose takes one pill for HTN Referrals [facility] Disagree/Comply: Comply Call Id: [id number]

## 2016-06-08 NOTE — Telephone Encounter (Signed)
Pt called c/o swelling of left ankle and unable to put pressure on it. Sent call to [facility] Triage.   Call pt @ [phone]

## 2016-06-08 NOTE — ED Triage Notes (Signed)
Pt states she had some pain behind her right knee last week.  That has since subsided but she is now having pain and swelling in her right ankle.  Pt denies any injury to the leg or ankle.

## 2016-06-08 NOTE — Telephone Encounter (Signed)
Going to Urgent care, thanks

## 2016-06-20 DIAGNOSIS — S8254XA Nondisplaced fracture of medial malleolus of right tibia, initial encounter for closed fracture: Secondary | ICD-10-CM | POA: Diagnosis not present

## 2016-07-02 ENCOUNTER — Other Ambulatory Visit: Payer: Self-pay | Admitting: Internal Medicine

## 2016-07-02 DIAGNOSIS — Z1231 Encounter for screening mammogram for malignant neoplasm of breast: Secondary | ICD-10-CM

## 2016-07-12 DIAGNOSIS — Z1211 Encounter for screening for malignant neoplasm of colon: Secondary | ICD-10-CM | POA: Diagnosis not present

## 2016-07-12 DIAGNOSIS — Z124 Encounter for screening for malignant neoplasm of cervix: Secondary | ICD-10-CM | POA: Diagnosis not present

## 2016-07-12 DIAGNOSIS — Z01419 Encounter for gynecological examination (general) (routine) without abnormal findings: Secondary | ICD-10-CM | POA: Diagnosis not present

## 2016-07-26 DIAGNOSIS — S8254XD Nondisplaced fracture of medial malleolus of right tibia, subsequent encounter for closed fracture with routine healing: Secondary | ICD-10-CM | POA: Diagnosis not present

## 2016-08-03 ENCOUNTER — Ambulatory Visit
Admission: RE | Admit: 2016-08-03 | Discharge: 2016-08-03 | Disposition: A | Discharge status: Home / Self Care | Payer: Medicare Other | Source: Ambulatory Visit | Attending: Internal Medicine | Admitting: Internal Medicine

## 2016-08-03 DIAGNOSIS — Z1231 Encounter for screening mammogram for malignant neoplasm of breast: Secondary | ICD-10-CM | POA: Diagnosis not present

## 2016-09-01 ENCOUNTER — Other Ambulatory Visit: Payer: Self-pay | Admitting: Internal Medicine

## 2016-09-13 ENCOUNTER — Telehealth: Payer: Self-pay | Admitting: Internal Medicine

## 2016-09-13 NOTE — Telephone Encounter (Signed)
Left pt message asking to call Allison back directly at [phone] to schedule AWV. Thanks! °

## 2016-10-12 NOTE — Telephone Encounter (Signed)
Left pt message asking to call Allison back directly at [phone] to schedule AWV. Thanks! °

## 2016-10-22 DIAGNOSIS — H25013 Cortical age-related cataract, bilateral: Secondary | ICD-10-CM | POA: Diagnosis not present

## 2016-10-29 ENCOUNTER — Ambulatory Visit: Payer: Medicare Other | Admitting: Internal Medicine

## 2016-11-01 ENCOUNTER — Encounter: Payer: Self-pay | Admitting: Internal Medicine

## 2016-11-01 ENCOUNTER — Ambulatory Visit (INDEPENDENT_AMBULATORY_CARE_PROVIDER_SITE_OTHER): Payer: Medicare Other | Admitting: Internal Medicine

## 2016-11-01 VITALS — BP 136/72 | HR 89 | Temp 98.5°F | Resp 12 | Ht 62.0 in | Wt 186.6 lb

## 2016-11-01 DIAGNOSIS — Z8781 Personal history of (healed) traumatic fracture: Secondary | ICD-10-CM

## 2016-11-01 DIAGNOSIS — R739 Hyperglycemia, unspecified: Secondary | ICD-10-CM | POA: Diagnosis not present

## 2016-11-01 DIAGNOSIS — G40909 Epilepsy, unspecified, not intractable, without status epilepticus: Secondary | ICD-10-CM

## 2016-11-01 DIAGNOSIS — Z87898 Personal history of other specified conditions: Secondary | ICD-10-CM | POA: Diagnosis not present

## 2016-11-01 DIAGNOSIS — H25013 Cortical age-related cataract, bilateral: Secondary | ICD-10-CM | POA: Diagnosis not present

## 2016-11-01 DIAGNOSIS — I10 Essential (primary) hypertension: Secondary | ICD-10-CM | POA: Diagnosis not present

## 2016-11-01 LAB — CBC WITH DIFFERENTIAL/PLATELET
BASOS ABS: 0 10*3/uL (ref 0.0–0.1)
Basophils Relative: 0.4 % (ref 0.0–3.0)
EOS ABS: 0.1 10*3/uL (ref 0.0–0.7)
EOS PCT: 0.9 % (ref 0.0–5.0)
HCT: 39.3 % (ref 36.0–46.0)
Hemoglobin: 13.2 g/dL (ref 12.0–15.0)
LYMPHS ABS: 3.5 10*3/uL (ref 0.7–4.0)
Lymphocytes Relative: 41 % (ref 12.0–46.0)
MCHC: 33.6 g/dL (ref 30.0–36.0)
MCV: 86 fl (ref 78.0–100.0)
MONO ABS: 0.5 10*3/uL (ref 0.1–1.0)
Monocytes Relative: 5.7 % (ref 3.0–12.0)
Neutro Abs: 4.5 10*3/uL (ref 1.4–7.7)
Neutrophils Relative %: 52 % (ref 43.0–77.0)
Platelets: 156 10*3/uL (ref 150.0–400.0)
RBC: 4.57 Mil/uL (ref 3.87–5.11)
RDW: 13.6 % (ref 11.5–15.5)
WBC: 8.6 10*3/uL (ref 4.0–10.5)

## 2016-11-01 LAB — COMPREHENSIVE METABOLIC PANEL
ALT: 22 U/L (ref 0–35)
AST: 15 U/L (ref 0–37)
Albumin: 4.2 g/dL (ref 3.5–5.2)
Alkaline Phosphatase: 71 U/L (ref 39–117)
BILIRUBIN TOTAL: 0.4 mg/dL (ref 0.2–1.2)
BUN: 18 mg/dL (ref 6–23)
CHLORIDE: 103 meq/L (ref 96–112)
CO2: 32 meq/L (ref 19–32)
CREATININE: 0.94 mg/dL (ref 0.40–1.20)
Calcium: 11.3 mg/dL — ABNORMAL HIGH (ref 8.4–10.5)
GFR: 79.21 mL/min (ref >60.00)
GLUCOSE: 106 mg/dL — AB (ref 70–99)
Potassium: 4 mEq/L (ref 3.5–5.1)
SODIUM: 138 meq/L (ref 135–145)
Total Protein: 7.1 g/dL (ref 6.0–8.3)

## 2016-11-01 LAB — HEMOGLOBIN A1C: HEMOGLOBIN A1C: 6.2 % (ref 4.6–6.5)

## 2016-11-01 LAB — TSH: TSH: 1.47 u[IU]/mL (ref 0.35–4.50)

## 2016-11-01 NOTE — Progress Notes (Signed)
Patient ID: Rachael Zimmerman, female   DOB: 1961/06/10, 56 y.o.   MRN: [id number]   Subjective:    Patient ID: Rachael Zimmerman, female    DOB: [birth year], 56 y.o.   MRN: [id number]  HPI  Patient here for a scheduled follow up.  Recently had fracture of right ankle.  Was followed by Danise Mina.  Has been released.  No chest pain.  No sob.  No acid reflux.  No abdominal pain.  Bowels moving.  Planning for left cataract surgery.  Has had pre op evaluation.  Saw Dr Ouida Sills 07/2016.  She has not returned cologuard.  Discussed with her today.  She will check to confirm she still has the kit and get this returned.     Past Medical History:  Diagnosis Date  . 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[place])    s/p craniectomy  . Seizures ([place])    Past Surgical History:  Procedure Laterality Date  . ABDOMINAL HYSTERECTOMY  2010   ovaries not removed  . BRAIN SURGERY     tumor excision s/p craniectomy  . BREAST BIOPSY Left 2010   benign   Family History  Problem Relation Age of Onset  . Cancer Mother        ovary  . Breast cancer Neg Hx   . Colon cancer Neg Hx    Social History   Social History  . Marital status: Divorced    Spouse name: N/A  . Number of children: 1  . Years of education: N/A   Occupational History  .  [employer]   Social History Main Topics  . Smoking status: Never Smoker  . Smokeless tobacco: Never Used  . Alcohol use No  . Drug use: No  . Sexual activity: Not Asked   Other Topics Concern  . None   Social History Narrative  . None    Outpatient Encounter Prescriptions as of 11/01/2016  Medication Sig  . hydrochlorothiazide (MICROZIDE) 12.5 MG capsule TAKE 1 CAPSULE (12.5 MG TOTAL) BY MOUTH DAILY.  . [DISCONTINUED] hydrochlorothiazide (MICROZIDE) 12.5 MG capsule Take 1 capsule (12.5 mg total) by mouth daily.   No facility-administered encounter medications on file as of 11/01/2016.     Review of Systems  Constitutional: Negative for appetite  change and unexpected weight change.  HENT: Negative for congestion and sinus pressure.   Respiratory: Negative for cough, chest tightness and shortness of breath.   Cardiovascular: Negative for chest pain, palpitations and leg swelling.  Gastrointestinal: Negative for abdominal pain, diarrhea, nausea and vomiting.  Genitourinary: Negative for difficulty urinating and dysuria.  Musculoskeletal: Negative for back pain and myalgias.  Skin: Negative for color change and rash.  Neurological: Negative for dizziness, light-headedness and headaches.  Psychiatric/Behavioral: Negative for agitation and dysphoric mood.       Objective:     Blood pressure rechecked by me:  130/72  Physical Exam  Constitutional: She appears well-developed and well-nourished. No distress.  HENT:  Nose: Nose normal.  Mouth/Throat: Oropharynx is clear and moist.  Neck: Neck supple. No thyromegaly present.  Cardiovascular: Normal rate and regular rhythm.   Pulmonary/Chest: Breath sounds normal. No respiratory distress. She has no wheezes.  Abdominal: Soft. Bowel sounds are normal. There is no tenderness.  Musculoskeletal: She exhibits no edema or tenderness.  Lymphadenopathy:    She has no cervical adenopathy.  Skin: No rash noted. No erythema.  Psychiatric: She has a normal mood and affect. Her behavior is normal.  BP 136/72 (BP Location: Left Arm, Patient Position: Sitting, Cuff Size: Normal)   Pulse 89   Temp 98.5 F (36.9 C) (Oral)   Resp 12   Ht _0  (1.575 m)   Wt 186 lb 9.6 oz (84.6 kg)   SpO2 96%   BMI 34.13 kg/m  Wt Readings from Last 3 Encounters:  11/01/16 186 lb 9.6 oz (84.6 kg)  05/01/16 184 lb 6.4 oz (83.6 kg)  08/10/15 185 lb (83.9 kg)     Lab Results  Component Value Date   WBC 8.6 11/01/2016   HGB 13.2 11/01/2016   HCT 39.3 11/01/2016   PLT 156.0 11/01/2016   GLUCOSE 106 (H) 11/01/2016   CHOL 163 01/27/2015   TRIG 48.0 01/27/2015   HDL 51.80 01/27/2015   LDLCALC 101 (H)  01/27/2015   ALT 22 11/01/2016   AST 15 11/01/2016   NA 138 11/01/2016   K 4.0 11/01/2016   CL 103 11/01/2016   CREATININE 0.94 11/01/2016   BUN 18 11/01/2016   CO2 32 11/01/2016   TSH 1.47 11/01/2016   HGBA1C 6.2 11/01/2016    Mm Screening Breast Tomo Bilateral  Result Date: 08/03/2016 CLINICAL DATA:  Screening. EXAM: 2D DIGITAL SCREENING BILATERAL MAMMOGRAM WITH CAD AND ADJUNCT TOMO COMPARISON:  Previous exam(s). ACR Breast Density Category b: There are scattered areas of fibroglandular density. FINDINGS: There are no findings suspicious for malignancy. Images were processed with CAD. IMPRESSION: No mammographic evidence of malignancy. A result letter of this screening mammogram will be mailed directly to the patient. RECOMMENDATION: Screening mammogram in one year. (Code:SM-B-01Y) BI-RADS CATEGORY  1: Negative. Electronically Signed   By: Curlene Dolphin M.D.   On: 08/03/2016 16:46       Assessment & Plan:   Problem List Items Addressed This Visit    Essential hypertension, benign - Primary    Blood pressure under good control.  Continue same medication regimen.  Follow pressures.  Follow metabolic panel.        Relevant Orders   CBC with Differential/Platelet (Completed)   TSH (Completed)   History of ankle fracture    Has been released by ortho.  Discussed bone density.  Will let me know if agreeable.        History of brain tumor    S/p excision/craniectomy.  Some residual right leg weakness.  Has seen Dr Manuella Ghazi.       Hyperglycemia    Low carb diet and exercise.  Follow met b and a1c.        Relevant Orders   Hemoglobin A1c (Completed)   Comprehensive metabolic panel (Completed)   Seizure disorder ([place])    Doing well.  Off keppra.  Follow.            Einar Pheasant, MD

## 2016-11-01 NOTE — Progress Notes (Signed)
Pre-visit discussion using our clinic review tool. No additional management support is needed unless otherwise documented below in the visit note.  

## 2016-11-02 ENCOUNTER — Other Ambulatory Visit: Payer: Self-pay | Admitting: Internal Medicine

## 2016-11-02 NOTE — Progress Notes (Signed)
Order placed for f/u labs.  

## 2016-11-04 ENCOUNTER — Encounter: Payer: Self-pay | Admitting: Internal Medicine

## 2016-11-04 DIAGNOSIS — Z8781 Personal history of (healed) traumatic fracture: Secondary | ICD-10-CM | POA: Insufficient documentation

## 2016-11-04 NOTE — Assessment & Plan Note (Signed)
Low carb diet and exercise.  Follow met b and a1c.   

## 2016-11-04 NOTE — Assessment & Plan Note (Signed)
Blood pressure under good control.  Continue same medication regimen.  Follow pressures.  Follow metabolic panel.   

## 2016-11-04 NOTE — Assessment & Plan Note (Signed)
Doing well.  Off keppra.  Follow.

## 2016-11-04 NOTE — Assessment & Plan Note (Signed)
Has been released by ortho.  Discussed bone density.  Will let me know if agreeable.

## 2016-11-04 NOTE — Assessment & Plan Note (Signed)
S/p excision/craniectomy.  Some residual right leg weakness.  Has seen Dr Manuella Ghazi.

## 2016-11-06 ENCOUNTER — Encounter: Payer: Self-pay | Admitting: *Deleted

## 2016-11-06 NOTE — H&P (Signed)
See scanned note.

## 2016-11-07 ENCOUNTER — Ambulatory Visit: Payer: Medicare Other | Admitting: Anesthesiology

## 2016-11-07 ENCOUNTER — Encounter: Payer: Self-pay | Admitting: Ophthalmology

## 2016-11-07 ENCOUNTER — Ambulatory Visit
Admission: RE | Admit: 2016-11-07 | Discharge: 2016-11-07 | Disposition: A | Discharge status: Home / Self Care | Payer: Medicare Other | Source: Ambulatory Visit | Attending: Ophthalmology | Admitting: Ophthalmology

## 2016-11-07 ENCOUNTER — Encounter
Admission: RE | Disposition: A | Discharge status: Home / Self Care | Payer: Self-pay | Source: Ambulatory Visit | Attending: Ophthalmology

## 2016-11-07 DIAGNOSIS — H25013 Cortical age-related cataract, bilateral: Secondary | ICD-10-CM | POA: Diagnosis not present

## 2016-11-07 DIAGNOSIS — Z9889 Other specified postprocedural states: Secondary | ICD-10-CM | POA: Diagnosis not present

## 2016-11-07 DIAGNOSIS — H25012 Cortical age-related cataract, left eye: Secondary | ICD-10-CM | POA: Diagnosis not present

## 2016-11-07 DIAGNOSIS — Z90710 Acquired absence of both cervix and uterus: Secondary | ICD-10-CM | POA: Diagnosis not present

## 2016-11-07 DIAGNOSIS — H2512 Age-related nuclear cataract, left eye: Secondary | ICD-10-CM | POA: Diagnosis not present

## 2016-11-07 DIAGNOSIS — Z880 Allergy status to penicillin: Secondary | ICD-10-CM | POA: Insufficient documentation

## 2016-11-07 DIAGNOSIS — I10 Essential (primary) hypertension: Secondary | ICD-10-CM | POA: Insufficient documentation

## 2016-11-07 DIAGNOSIS — Z79899 Other long term (current) drug therapy: Secondary | ICD-10-CM | POA: Insufficient documentation

## 2016-11-07 HISTORY — PX: CATARACT EXTRACTION W/PHACO: SHX586

## 2016-11-07 HISTORY — DX: Essential (primary) hypertension: I10

## 2016-11-07 SURGERY — PHACOEMULSIFICATION, CATARACT, WITH IOL INSERTION
Anesthesia: Monitor Anesthesia Care | Site: Eye | Laterality: Left | Wound class: Clean

## 2016-11-07 MED ORDER — MOXIFLOXACIN HCL 0.5 % OP SOLN
OPHTHALMIC | Status: AC
  Filled 2016-11-07: qty 3

## 2016-11-07 MED ORDER — MOXIFLOXACIN HCL 0.5 % OP SOLN
OPHTHALMIC | Status: DC | PRN
  Administered 2016-11-07: 0.2 mL via OPHTHALMIC

## 2016-11-07 MED ORDER — MOXIFLOXACIN HCL 0.5 % OP SOLN
1.0000 [drp] | OPHTHALMIC | Status: AC
  Administered 2016-11-07 (×3): 1 [drp] via OPHTHALMIC

## 2016-11-07 MED ORDER — TETRACAINE HCL 0.5 % OP SOLN
OPHTHALMIC | Status: DC | PRN
  Administered 2016-11-07: 1 [drp] via OPHTHALMIC

## 2016-11-07 MED ORDER — TRYPAN BLUE 0.06 % OP SOLN
OPHTHALMIC | Status: AC
  Filled 2016-11-07: qty 0.5

## 2016-11-07 MED ORDER — NA CHONDROIT SULF-NA HYALURON 40-17 MG/ML IO SOLN
INTRAOCULAR | Status: DC | PRN
  Administered 2016-11-07: 1 mL via INTRAOCULAR

## 2016-11-07 MED ORDER — BUPIVACAINE HCL (PF) 0.75 % IJ SOLN
INTRAMUSCULAR | Status: AC
  Filled 2016-11-07: qty 10

## 2016-11-07 MED ORDER — HYALURONIDASE HUMAN 150 UNIT/ML IJ SOLN
INTRAMUSCULAR | Status: AC
  Filled 2016-11-07: qty 1

## 2016-11-07 MED ORDER — CARBACHOL 0.01 % IO SOLN
INTRAOCULAR | Status: DC | PRN
  Administered 2016-11-07: 0.5 mL via INTRAOCULAR

## 2016-11-07 MED ORDER — ALFENTANIL 500 MCG/ML IJ INJ
INJECTION | INTRAVENOUS | Status: AC
  Filled 2016-11-07: qty 5

## 2016-11-07 MED ORDER — SODIUM CHLORIDE 0.9 % IV SOLN
INTRAVENOUS | Status: DC
  Administered 2016-11-07: 08:00:00 via INTRAVENOUS

## 2016-11-07 MED ORDER — ALFENTANIL 500 MCG/ML IJ INJ
INJECTION | INTRAVENOUS | Status: DC | PRN
  Administered 2016-11-07: 250 ug via INTRAVENOUS

## 2016-11-07 MED ORDER — TETRACAINE HCL 0.5 % OP SOLN
OPHTHALMIC | Status: AC
  Filled 2016-11-07: qty 2

## 2016-11-07 MED ORDER — CYCLOPENTOLATE HCL 2 % OP SOLN
1.0000 [drp] | OPHTHALMIC | Status: AC
  Administered 2016-11-07 (×4): 1 [drp] via OPHTHALMIC

## 2016-11-07 MED ORDER — LIDOCAINE HCL (PF) 4 % IJ SOLN
INTRAMUSCULAR | Status: DC | PRN
  Administered 2016-11-07: 10:00:00 via OPHTHALMIC

## 2016-11-07 MED ORDER — CEFUROXIME OPHTHALMIC INJECTION 1 MG/0.1 ML
INJECTION | OPHTHALMIC | Status: AC
  Filled 2016-11-07: qty 0.1

## 2016-11-07 MED ORDER — TRYPAN BLUE 0.06 % OP SOLN
OPHTHALMIC | Status: DC | PRN
  Administered 2016-11-07: 0.5 mL via INTRAOCULAR

## 2016-11-07 MED ORDER — POVIDONE-IODINE 5 % OP SOLN
OPHTHALMIC | Status: DC | PRN
  Administered 2016-11-07: 1 via OPHTHALMIC

## 2016-11-07 MED ORDER — EPINEPHRINE PF 1 MG/ML IJ SOLN
INTRAMUSCULAR | Status: AC
  Filled 2016-11-07: qty 2

## 2016-11-07 MED ORDER — NA CHONDROIT SULF-NA HYALURON 40-17 MG/ML IO SOLN
INTRAOCULAR | Status: AC
  Filled 2016-11-07: qty 1

## 2016-11-07 MED ORDER — LIDOCAINE HCL (PF) 2 % IJ SOLN
INTRAMUSCULAR | Status: AC
  Filled 2016-11-07: qty 2

## 2016-11-07 MED ORDER — PHENYLEPHRINE HCL 10 % OP SOLN
OPHTHALMIC | Status: AC
  Filled 2016-11-07: qty 5

## 2016-11-07 MED ORDER — POVIDONE-IODINE 5 % OP SOLN
OPHTHALMIC | Status: AC
  Filled 2016-11-07: qty 30

## 2016-11-07 MED ORDER — LIDOCAINE HCL (PF) 4 % IJ SOLN
INTRAOCULAR | Status: DC | PRN
  Administered 2016-11-07: 4 mL via OPHTHALMIC

## 2016-11-07 MED ORDER — CYCLOPENTOLATE HCL 2 % OP SOLN
OPHTHALMIC | Status: AC
  Filled 2016-11-07: qty 2

## 2016-11-07 MED ORDER — PHENYLEPHRINE HCL 10 % OP SOLN
1.0000 [drp] | OPHTHALMIC | Status: AC
  Administered 2016-11-07 (×4): 1 [drp] via OPHTHALMIC

## 2016-11-07 MED ORDER — EPINEPHRINE PF 1 MG/ML IJ SOLN
INTRAMUSCULAR | Status: DC | PRN
  Administered 2016-11-07: 10:00:00 via OPHTHALMIC

## 2016-11-07 MED ORDER — PROPOFOL 10 MG/ML IV BOLUS
INTRAVENOUS | Status: DC | PRN
  Administered 2016-11-07: 20 mg via INTRAVENOUS

## 2016-11-07 SURGICAL SUPPLY — 27 items
CANNULA ANT/CHMB 27GA (MISCELLANEOUS) ×4 IMPLANT
CORD BIP STRL DISP 12FT (MISCELLANEOUS) ×2 IMPLANT
DRAPE XRAY CASSETTE 23X24 (DRAPES) ×2 IMPLANT
ERASER HMR WETFIELD 18G (MISCELLANEOUS) ×2 IMPLANT
FILTER MILLEX .045 (MISCELLANEOUS) ×1 IMPLANT
FLTR MILLEX .045 (MISCELLANEOUS) ×2
GLOVE BIO SURGEON STRL SZ8 (GLOVE) ×2 IMPLANT
GLOVE SURG LX 6.5 MICRO (GLOVE) ×1
GLOVE SURG LX 8.0 MICRO (GLOVE) ×1
GLOVE SURG LX STRL 6.5 MICRO (GLOVE) ×1 IMPLANT
GLOVE SURG LX STRL 8.0 MICRO (GLOVE) ×1 IMPLANT
GOWN STRL REUS W/ TWL LRG LVL3 (GOWN DISPOSABLE) ×1 IMPLANT
GOWN STRL REUS W/ TWL XL LVL3 (GOWN DISPOSABLE) ×1 IMPLANT
GOWN STRL REUS W/TWL LRG LVL3 (GOWN DISPOSABLE) ×1
GOWN STRL REUS W/TWL XL LVL3 (GOWN DISPOSABLE) ×1
LENS IOL ACRYSOF IQ 21.5 (Intraocular Lens) ×2 IMPLANT
PACK CATARACT (MISCELLANEOUS) ×2 IMPLANT
PACK CATARACT DINGLEDEIN LX (MISCELLANEOUS) ×2 IMPLANT
PACK EYE AFTER SURG (MISCELLANEOUS) ×2 IMPLANT
SHLD EYE VISITEC  UNIV (MISCELLANEOUS) ×2 IMPLANT
SOL BSS BAG (MISCELLANEOUS) ×2
SOLUTION BSS BAG (MISCELLANEOUS) ×1 IMPLANT
SUT SILK 5-0 (SUTURE) ×2 IMPLANT
SYR 3ML LL SCALE MARK (SYRINGE) ×2 IMPLANT
SYR 5ML LL (SYRINGE) ×2 IMPLANT
WATER STERILE IRR 250ML POUR (IV SOLUTION) ×2 IMPLANT
WIPE NON LINTING 3.25X3.25 (MISCELLANEOUS) ×2 IMPLANT

## 2016-11-07 NOTE — Interval H&P Note (Signed)
History and Physical Interval Note:  11/07/2016 9:14 AM  Rachael Zimmerman  has presented today for surgery, with the diagnosis of NUCLEAR SCLEROTIC CATARACT LEFT EYE  The various methods of treatment have been discussed with the patient and family. After consideration of risks, benefits and other options for treatment, the patient has consented to  Procedure(s) with comments: CATARACT EXTRACTION PHACO AND INTRAOCULAR LENS PLACEMENT (IOC) (Left) - [place] AP% CDE Fluid Pack Lot # [id number] as a surgical intervention .  The patient's history has been reviewed, patient examined, no change in status, stable for surgery.  I have reviewed the patient's chart and labs.  Questions were answered to the patient's satisfaction.     [name]

## 2016-11-07 NOTE — Op Note (Signed)
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 Item Serial No. Manufacturer Lot No. LRB No. Used  LENS IOL ACRYSOF IQ 21.5 - [id number] Intraocular Lens LENS IOL ACRYSOF IQ 21.5 [id number] ALCON   Left 1   Anesthesia: 2% Lidocaine and 4% Marcaine in a 50/50 mixture with 10 unites/ml of Hylenex given as a peribulbar Anesthesiologist: Anesthesiologist: Emmie Niemann, MD CRNA: Kennon Holter, CRNA Complications: none Estimated Blood Loss: less than 1 ml  Description of procedure:  The patient was given anesthesia and sedation via intravenous access. The patient was then prepped and draped in the usual fashion. A 25-gauge needle was bent for initiating the capsulorhexis. A 5-0 silk suture was placed through the conjunctiva superior and inferiorly to serve as bridle sutures. Hemostasis was obtained at the superior limbus using an eraser cautery. A partial thickness groove was made at the anterior surgical limbus with a 64 Beaver blade and this was dissected anteriorly with an Avaya. The anterior chamber was entered at 10 o'clock and 2 o'clock with a 1.0 mm paracentesis knife. Epi-Shugarcaine 0.5 CC [9 cc BSS Plus (Alcon), 3 cc 4% preservative-free lidocaine (Hospira) and 4 cc 1:1000 preservative-free, bisulfite-free epinephrine] was injected into the anterior chamber via the paracentesis tract. Air was introduced via the paracentesis to replace the aqueous and Vision Blue was used to paint the surface of the anterior capsule. The air was then replaced with DiscoVisc.   The anterior chamber was entered through the lamellar dissection with a 2.6 mm Alcon keratome.A continuous tear curvilinear  capsulorhexis was performed using a bent 25-gauge needle.  Balance salt on a syringe was used to perform hydro-dissection and phacoemulsification was carried out using a divide and conquer technique. Procedure(s) with comments: CATARACT EXTRACTION PHACO AND INTRAOCULAR LENS PLACEMENT (IOC) (Left) - [place] 00:53.9 AP% 16.7 CDE 17.02 Fluid Pack Lot # [id number]. Irrigation/aspiration was used to remove the residual cortex and the capsular bag was inflated with DiscoVisc. Irrigation/aspiration was used to remove the residual cortex and the capsular bag was inflated with DiscoVisc. The intraocular lens was inserted into the capsular bag using a pre-loaded UltraSert Delivery System. Irrigation/aspiration was used to remove the residual DiscoVisc. The wound was inflated with balanced salt and checked for leaks. None were found. Miostat was injected via the paracentesis track and 0.1 ml of Vigamox containing 1 mg of drug  was injected via the paracentesis track. The wound was checked for leaks again and none were found.   The bridal sutures were removed and two drops of Vigamox were placed on the eye. An eye shield was placed to protect the eye and the patient was discharged to the recovery area in good condition.   Misa Fedorko MD

## 2016-11-07 NOTE — Anesthesia Post-op Follow-up Note (Cosign Needed)
Anesthesia QCDR form completed.        

## 2016-11-07 NOTE — Transfer of Care (Signed)
Immediate Anesthesia Transfer of Care Note  Patient: Rachael Zimmerman  Procedure(s) Performed: Procedure(s) with comments: CATARACT EXTRACTION PHACO AND INTRAOCULAR LENS PLACEMENT (IOC) (Left) - [place] 00:53.9 AP% 16.7 CDE 17.02 Fluid Pack Lot # [id number]  Patient Location: PACU and Short Stay  Anesthesia Type:MAC  Level of Consciousness: awake, alert  and oriented  Airway & Oxygen Therapy: Patient Spontanous Breathing  Post-op Assessment: Report given to RN and Post -op Vital signs reviewed and stable  Post vital signs: Reviewed and stable  Last Vitals:  Vitals:   11/07/16 0814 11/07/16 1000  BP: 139/80 (!) 151/83  Pulse: 95 76  Resp: 18   Temp: 36.7 C (!) 35.6 C    Last Pain:  Vitals:   11/07/16 1000  TempSrc: Temporal       VSS, NAD, A & O X 3.     Past Medical History:  Diagnosis Date  . 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[place])    s/p craniectomy  . Hypertension   . Seizures ([place])    AFTER BRAIN SURGERY     Complications: No apparent anesthesia complications

## 2016-11-07 NOTE — Anesthesia Procedure Notes (Signed)
Procedure Name: MAC Date/Time: 11/07/2016 9:32 AM Performed by: Kennon Holter Pre-anesthesia Checklist: Patient identified, Emergency Drugs available, Suction available, Patient being monitored and Timeout performed Patient Re-evaluated:Patient Re-evaluated prior to inductionOxygen Delivery Method: Nasal cannula Preoxygenation: Pre-oxygenation with 100% oxygen Intubation Type: IV induction Placement Confirmation: positive ETCO2

## 2016-11-07 NOTE — Discharge Instructions (Addendum)
Eye Surgery Discharge Instructions  Expect mild scratchy sensation or mild soreness. DO NOT RUB YOUR EYE!  The day of surgery:  Minimal physical activity, but bed rest is not required  No reading, computer work, or close hand work  No bending, lifting, or straining.  May watch TV  For 24 hours:  No driving, legal decisions, or alcoholic beverages  Safety precautions  Eat anything you prefer: It is better to start with liquids, then soup then solid foods.  _____ Eye patch should be worn until postoperative exam tomorrow.  ____ Solar shield eyeglasses should be worn for comfort in the sunlight/patch while sleeping  Resume all regular medications including aspirin or Coumadin if these were discontinued prior to surgery. You may shower, bathe, shave, or wash your hair. Tylenol may be taken for mild discomfort.  Call your doctor if you experience significant pain, nausea, or vomiting, fever > 101 or other signs of infection. [phone] or [phone] Specific instructions:  Follow-up Information    Dingeldein, Remo Lipps, MD Follow up.   Specialty:  Ophthalmology Why:  11-07-16 at 8:20 Contact information: [place] [address] [phone]          Eye Surgery Discharge Instructions  Expect mild scratchy sensation or mild soreness. DO NOT RUB YOUR EYE!  The day of surgery:  Minimal physical activity, but bed rest is not required  No reading, computer work, or close hand work  No bending, lifting, or straining.  May watch TV  For 24 hours:  No driving, legal decisions, or alcoholic beverages  Safety precautions  Eat anything you prefer: It is better to start with liquids, then soup then solid foods.  _____ Eye patch should be worn until postoperative exam tomorrow.  ____ Solar shield eyeglasses should be worn for comfort in the sunlight/patch while sleeping  Resume all regular medications including aspirin or Coumadin if these  were discontinued prior to surgery. You may shower, bathe, shave, or wash your hair. Tylenol may be taken for mild discomfort.  Call your doctor if you experience significant pain, nausea, or vomiting, fever > 101 or other signs of infection. [phone] or [phone] Specific instructions:  Follow-up Information    Dingeldein, Remo Lipps, MD Follow up.   Specialty:  Ophthalmology Why:  11-07-16 at 8:20 Contact information: [address]   [place] [address] [phone]         See handout.

## 2016-11-07 NOTE — Anesthesia Preprocedure Evaluation (Signed)
Anesthesia Evaluation  Patient identified by MRN, date of birth, ID band Patient awake    Reviewed: Allergy & Precautions, NPO status , Patient's Chart, lab work & pertinent test results  History of Anesthesia Complications Negative for: history of anesthetic complications  Airway Mallampati: II  TM Distance: >3 FB Neck ROM: Full    Dental no notable dental hx.    Pulmonary neg pulmonary ROS, neg sleep apnea, neg COPD,    breath sounds clear to auscultation- rhonchi (-) wheezing      Cardiovascular hypertension, (-) CAD, (-) Past MI and (-) Cardiac Stents  Rhythm:Regular Rate:Normal - Systolic murmurs and - Diastolic murmurs    Neuro/Psych    GI/Hepatic negative GI ROS, Neg liver ROS,   Endo/Other  negative endocrine ROSneg diabetes  Renal/GU negative Renal ROS     Musculoskeletal negative musculoskeletal ROS (+)   Abdominal (+) + obese,   Peds  Hematology negative hematology ROS (+)   Anesthesia Other Findings Past Medical History: No date: 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HCC)     Comment: s/p craniectomy No date: Hypertension No date: Seizures (HCC)     Comment: AFTER BRAIN SURGERY   Reproductive/Obstetrics                             Anesthesia Physical Anesthesia Plan  ASA: II  Anesthesia Plan: MAC   Post-op Pain Management:    Induction: Intravenous  Airway Management Planned: Natural Airway  Additional Equipment:   Intra-op Plan:   Post-operative Plan:   Informed Consent: I have reviewed the patients History and Physical, chart, labs and discussed the procedure including the risks, benefits and alternatives for the proposed anesthesia with the patient or authorized representative who has indicated his/her understanding and acceptance.     Plan Discussed with: CRNA and Anesthesiologist  Anesthesia Plan Comments:         Anesthesia Quick Evaluation

## 2016-11-07 NOTE — Anesthesia Postprocedure Evaluation (Signed)
Anesthesia Post Note  Patient: Rachael Zimmerman  Procedure(s) Performed: Procedure(s) (LRB): CATARACT EXTRACTION PHACO AND INTRAOCULAR LENS PLACEMENT (IOC) (Left)  Patient location during evaluation: PACU Anesthesia Type: MAC Level of consciousness: awake and alert and oriented Pain management: pain level controlled Vital Signs Assessment: post-procedure vital signs reviewed and stable Respiratory status: spontaneous breathing, nonlabored ventilation and respiratory function stable Cardiovascular status: blood pressure returned to baseline and stable Postop Assessment: no signs of nausea or vomiting Anesthetic complications: no     Last Vitals:  Vitals:   11/07/16 1000 11/07/16 1022  BP: (!) 151/83 (!) 151/83  Pulse: 76 85  Resp:    Temp: (!) 35.6 C     Last Pain:  Vitals:   11/07/16 1000  TempSrc: Temporal                 Lamae Fosco

## 2016-11-12 ENCOUNTER — Other Ambulatory Visit (INDEPENDENT_AMBULATORY_CARE_PROVIDER_SITE_OTHER): Payer: Medicare Other

## 2016-11-12 LAB — CALCIUM: CALCIUM: 10.7 mg/dL — AB (ref 8.6–10.4)

## 2016-11-12 NOTE — Addendum Note (Signed)
Addended by: Arby Barrette on: 11/12/2016 11:26 AM   Modules accepted: Orders

## 2016-11-13 LAB — PARATHYROID HORMONE, INTACT (NO CA): PTH: 85 pg/mL — AB (ref 14–64)

## 2016-11-13 LAB — CALCIUM, IONIZED: CALCIUM ION: 5.9 mg/dL — AB (ref 4.8–5.6)

## 2016-11-18 ENCOUNTER — Other Ambulatory Visit: Payer: Self-pay | Admitting: Internal Medicine

## 2016-11-19 ENCOUNTER — Other Ambulatory Visit: Payer: Self-pay | Admitting: Internal Medicine

## 2016-11-19 NOTE — Progress Notes (Signed)
Order placed for parathyroid scan.  

## 2016-11-29 ENCOUNTER — Ambulatory Visit
Admission: RE | Admit: 2016-11-29 | Discharge: 2016-11-29 | Disposition: A | Discharge status: Home / Self Care | Payer: Medicare Other | Source: Ambulatory Visit | Attending: Internal Medicine | Admitting: Internal Medicine

## 2016-11-29 DIAGNOSIS — R948 Abnormal results of function studies of other organs and systems: Secondary | ICD-10-CM | POA: Insufficient documentation

## 2016-11-29 MED ORDER — TECHNETIUM TC 99M SESTAMIBI - CARDIOLITE
23.8500 | Freq: Once | INTRAVENOUS | Status: AC | PRN
  Administered 2016-11-29: 12:00:00 23.85 via INTRAVENOUS

## 2016-12-04 ENCOUNTER — Encounter: Payer: Self-pay | Admitting: Internal Medicine

## 2016-12-04 ENCOUNTER — Ambulatory Visit (INDEPENDENT_AMBULATORY_CARE_PROVIDER_SITE_OTHER): Payer: Medicare Other | Admitting: Internal Medicine

## 2016-12-04 DIAGNOSIS — D351 Benign neoplasm of parathyroid gland: Secondary | ICD-10-CM | POA: Insufficient documentation

## 2016-12-04 DIAGNOSIS — Z87898 Personal history of other specified conditions: Secondary | ICD-10-CM

## 2016-12-04 DIAGNOSIS — I10 Essential (primary) hypertension: Secondary | ICD-10-CM | POA: Diagnosis not present

## 2016-12-04 DIAGNOSIS — R739 Hyperglycemia, unspecified: Secondary | ICD-10-CM

## 2016-12-04 DIAGNOSIS — Z8669 Personal history of other diseases of the nervous system and sense organs: Secondary | ICD-10-CM | POA: Diagnosis not present

## 2016-12-04 NOTE — Progress Notes (Signed)
Patient ID: Rachael Zimmerman, female   DOB: [birth year], 56 y.o.   MRN: [id number]   Subjective:    Patient ID: Rachael Zimmerman, female    DOB: 1961-02-17, 56 y.o.   MRN: [id number]  HPI  Patient here for a scheduled follow up.  She was recently found to have elevated calcium level and elevated PTH.  Is s/p parathyroid scan.  This revealed what appeared to be a parathyroid adenoma.  Discussed with her today.  She is currently doing well.  Feels good.  Tries to stay active.  No chest pain.  No sob.  No acid reflux. No nausea or vomiting.  No abdominal pain or cramping.  Bowels stable.     Past Medical History:  Diagnosis Date  . 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[place])    s/p craniectomy  . Hypertension   . Seizures ([place])    AFTER BRAIN SURGERY   Past Surgical History:  Procedure Laterality Date  . ABDOMINAL HYSTERECTOMY  2010   ovaries not removed  . BRAIN SURGERY     tumor excision s/p craniectomy  . BREAST BIOPSY Left 2010   benign  . CATARACT EXTRACTION W/PHACO Left 11/07/2016   Procedure: CATARACT EXTRACTION PHACO AND INTRAOCULAR LENS PLACEMENT (IOC);  Surgeon: Estill Cotta, MD;  Location: [facility] ORS;  Service: Ophthalmology;  Laterality: Left;  [place] 00:53.9 AP% 16.7 CDE 17.02 Fluid Pack Lot # [id number]   Family History  Problem Relation Age of Onset  . Cancer Mother        ovary  . Breast cancer Neg Hx   . Colon cancer Neg Hx    Social History   Social History  . Marital status: Legally Separated    Spouse name: N/A  . Number of children: 1  . Years of education: N/A   Occupational History  .  [employer]   Social History Main Topics  . Smoking status: Never Smoker  . Smokeless tobacco: Never Used  . Alcohol use No  . Drug use: No  . Sexual activity: Not Asked   Other Topics Concern  . None   Social History Narrative  . None    Outpatient Encounter Prescriptions as of 12/04/2016  Medication Sig  . acetaminophen (TYLENOL) 325 MG tablet Take 325 mg  by mouth daily as needed for mild pain.  . hydrochlorothiazide (MICROZIDE) 12.5 MG capsule TAKE 1 CAPSULE (12.5 MG TOTAL) BY MOUTH DAILY.  Marland Kitchen ILEVRO 0.3 % ophthalmic suspension INSTILL 1 DROP STARTING 2 DAYS PRIOR TO SURGERY IN OP EYE AND 1 DROP AM OF SURGERY CONTINUE TILL FIN   No facility-administered encounter medications on file as of 12/04/2016.     Review of Systems  Constitutional: Negative for appetite change and unexpected weight change.  HENT: Negative for congestion and sinus pressure.   Respiratory: Negative for cough, chest tightness and shortness of breath.   Cardiovascular: Negative for chest pain, palpitations and leg swelling.  Gastrointestinal: Negative for abdominal pain, diarrhea, nausea and vomiting.  Genitourinary: Negative for difficulty urinating and dysuria.  Musculoskeletal: Negative for back pain and joint swelling.  Skin: Negative for color change and rash.  Neurological: Negative for dizziness, light-headedness and headaches.  Psychiatric/Behavioral: Negative for agitation and dysphoric mood.       Objective:    Physical Exam  Constitutional: She appears well-developed and well-nourished. No distress.  HENT:  Nose: Nose normal.  Mouth/Throat: Oropharynx is clear and moist.  Neck: Neck supple. No thyromegaly present.  Cardiovascular: Normal  rate and regular rhythm.   Pulmonary/Chest: Breath sounds normal. No respiratory distress. She has no wheezes.  Abdominal: Soft. Bowel sounds are normal. There is no tenderness.  Musculoskeletal: She exhibits no edema or tenderness.  Lymphadenopathy:    She has no cervical adenopathy.  Skin: No rash noted. No erythema.  Psychiatric: She has a normal mood and affect. Her behavior is normal.    BP 134/70 (BP Location: Right Arm, Patient Position: Sitting, Cuff Size: Normal)   Pulse 81   Temp 98.7 F (37.1 C) (Oral)   Resp 12   Wt 187 lb 3.2 oz (84.9 kg)   SpO2 97%   BMI 34.24 kg/m  Wt Readings from Last 3  Encounters:  12/04/16 187 lb 3.2 oz (84.9 kg)  11/01/16 186 lb 9.6 oz (84.6 kg)  05/01/16 184 lb 6.4 oz (83.6 kg)     Lab Results  Component Value Date   WBC 8.6 11/01/2016   HGB 13.2 11/01/2016   HCT 39.3 11/01/2016   PLT 156.0 11/01/2016   GLUCOSE 106 (H) 11/01/2016   CHOL 163 01/27/2015   TRIG 48.0 01/27/2015   HDL 51.80 01/27/2015   LDLCALC 101 (H) 01/27/2015   ALT 22 11/01/2016   AST 15 11/01/2016   NA 138 11/01/2016   K 4.0 11/01/2016   CL 103 11/01/2016   CREATININE 0.94 11/01/2016   BUN 18 11/01/2016   CO2 32 11/01/2016   TSH 1.47 11/01/2016   HGBA1C 6.2 11/01/2016    Nm Parathyroid W/spect/ct  Result Date: 11/29/2016 CLINICAL DATA:  Hypercalcemia, elevated parathormone level EXAM: NM PARATHYROID SCINTIGRAPHY AND SPECT IMAGING TECHNIQUE: Following intravenous administration of radiopharmaceutical, early and 2-hour delayed planar images were obtained in the anterior projection. Delayed triplanar SPECT images were also obtained at 2 hours. RADIOPHARMACEUTICALS:  23.85 mCi Tc-36mSestamibi IV COMPARISON:  None FINDINGS: Planar imaging: Initial normal sestamibi accumulation within the thyroid lobes. On delayed image, significant abnormal sestamibi retention is identified at the RIGHT thyroid bed, mid to questionably slightly inferiorly. SPECT imaging: Delayed SPECT imaging demonstrates a focus of significant abnormal sestamibi accumulation posterior to the mid to slightly inferior RIGHT thyroid lobe consistent with a parathyroid adenoma. This corresponds to a small soft tissue nodule measuring approximately 7 mm diameter on CT series 3, image 121. No other abnormal foci of sestamibi accumulation are identified. No ectopic localization of sestamibi in the mediastinum. IMPRESSION: Abnormal parathyroid SPECT CT imaging demonstrating a focus of abnormal sestamibi retention posterior to the mid to slightly inferior RIGHT thyroid lobe consistent with a parathyroid adenoma and  corresponding to a 7 mm diameter nodule on CT. Electronically Signed   By: MLavonia DanaM.D.   On: 11/29/2016 16:28       Assessment & Plan:   Problem List Items Addressed This Visit    Essential hypertension, benign    Blood pressure under good control.  Continue same medication regimen.  Follow pressures.  Follow metabolic panel.        Relevant Orders   Hepatic function panel   Lipid panel   Basic metabolic panel   History of brain tumor    S/p excision/craniectomy.  Some residual leg weakness.  Has seen Dr SManuella Ghazi        History of seizure disorder    Doing well.  Off keppra.       Hyperglycemia    Low carb diet and exercise.  Follow met b and a1c.        Relevant Orders  Hemoglobin A1c   Parathyroid adenoma    Found recently to have elevated calcium and parathyroid hormone on lab testing.  Parathyroid scan as outlined - c/w parathyroid adenoma.  Discussed with her today.  Will have Dr Harlow Asa evaluate.        Relevant Orders   Ambulatory referral to General Surgery       Einar Pheasant, MD

## 2016-12-04 NOTE — Progress Notes (Signed)
Pre-visit discussion using our clinic review tool. No additional management support is needed unless otherwise documented below in the visit note.  

## 2016-12-07 ENCOUNTER — Encounter: Payer: Self-pay | Admitting: Internal Medicine

## 2016-12-07 NOTE — Assessment & Plan Note (Signed)
Found recently to have elevated calcium and parathyroid hormone on lab testing.  Parathyroid scan as outlined - c/w parathyroid adenoma.  Discussed with her today.  Will have Dr Harlow Asa evaluate.

## 2016-12-07 NOTE — Assessment & Plan Note (Signed)
Doing well.  Off keppra.

## 2016-12-07 NOTE — Assessment & Plan Note (Signed)
Blood pressure under good control.  Continue same medication regimen.  Follow pressures.  Follow metabolic panel.   

## 2016-12-07 NOTE — Assessment & Plan Note (Signed)
S/p excision/craniectomy.  Some residual leg weakness.  Has seen Dr Manuella Ghazi.

## 2016-12-07 NOTE — Assessment & Plan Note (Signed)
Low carb diet and exercise.  Follow met b and a1c.   

## 2017-02-20 ENCOUNTER — Ambulatory Visit: Payer: Self-pay | Admitting: Surgery

## 2017-02-20 DIAGNOSIS — E210 Primary hyperparathyroidism: Secondary | ICD-10-CM | POA: Diagnosis not present

## 2017-03-05 NOTE — Patient Instructions (Addendum)
Rachael Zimmerman  03/05/2017   Your procedure is scheduled on: 03-14-17  Report to [facility] Main  Entrance Take [place]  elevators to 3rd floor to  [place] at 530AM.    Call this number if you have problems the morning of surgery [phone]    Remember: ONLY 1 PERSON MAY GO WITH YOU TO SHORT STAY TO GET  READY MORNING OF [place].  Do not eat food or drink liquids :After Midnight.     Take these medicines the morning of surgery with A SIP OF WATER: NONE                                You may not have any metal on your body including hair pins and              piercings  Do not wear jewelry, make-up, lotions, powders or perfumes, deodorant             Do not wear nail polish.  Do not shave  48 hours prior to surgery.       Do not bring valuables to the hospital. [place].  Contacts, dentures or bridgework may not be worn into surgery.      Patients discharged the day of surgery will not be allowed to drive home.  Name and phone number of your driver:  Special Instructions: N/A              Please read over the following fact sheets you were given: _____________________________________________________________________           [facility] - Preparing for Surgery Before surgery, you can play an important role.  Because skin is not sterile, your skin needs to be as free of germs as possible.  You can reduce the number of germs on your skin by washing with CHG (chlorahexidine gluconate) soap before surgery.  CHG is an antiseptic cleaner which kills germs and bonds with the skin to continue killing germs even after washing. Please DO NOT use if you have an allergy to CHG or antibacterial soaps.  If your skin becomes reddened/irritated stop using the CHG and inform your nurse when you arrive at Short Stay. Do not shave (including legs and underarms) for at least 48 hours prior to the first CHG  shower.  You may shave your face/neck. Please follow these instructions carefully:  1.  Shower with CHG Soap the night before surgery and the  morning of Surgery.  2.  If you choose to wash your hair, wash your hair first as usual with your  normal  shampoo.  3.  After you shampoo, rinse your hair and body thoroughly to remove the  shampoo.                           4.  Use CHG as you would any other liquid soap.  You can apply chg directly  to the skin and wash                       Gently with a scrungie or clean washcloth.  5.  Apply the CHG Soap to your body ONLY  FROM THE NECK DOWN.   Do not use on face/ open                           Wound or open sores. Avoid contact with eyes, ears mouth and genitals (private parts).                       Wash face,  Genitals (private parts) with your normal soap.             6.  Wash thoroughly, paying special attention to the area where your surgery  will be performed.  7.  Thoroughly rinse your body with warm water from the neck down.  8.  DO NOT shower/wash with your normal soap after using and rinsing off  the CHG Soap.                9.  Pat yourself dry with a clean towel.            10.  Wear clean pajamas.            11.  Place clean sheets on your bed the night of your first shower and do not  sleep with pets. Day of Surgery : Do not apply any lotions/deodorants the morning of surgery.  Please wear clean clothes to the hospital/surgery center.  FAILURE TO FOLLOW THESE INSTRUCTIONS MAY RESULT IN THE CANCELLATION OF YOUR SURGERY PATIENT SIGNATURE_________________________________  NURSE SIGNATURE__________________________________  ________________________________________________________________________

## 2017-03-06 ENCOUNTER — Ambulatory Visit (INDEPENDENT_AMBULATORY_CARE_PROVIDER_SITE_OTHER): Payer: Medicare Other | Admitting: Internal Medicine

## 2017-03-06 ENCOUNTER — Encounter: Payer: Self-pay | Admitting: Internal Medicine

## 2017-03-06 VITALS — BP 120/74 | HR 92 | Temp 98.6°F | Resp 12 | Ht 63.0 in | Wt 185.8 lb

## 2017-03-06 DIAGNOSIS — Z0000 Encounter for general adult medical examination without abnormal findings: Secondary | ICD-10-CM

## 2017-03-06 DIAGNOSIS — R739 Hyperglycemia, unspecified: Secondary | ICD-10-CM | POA: Diagnosis not present

## 2017-03-06 DIAGNOSIS — Z87898 Personal history of other specified conditions: Secondary | ICD-10-CM | POA: Diagnosis not present

## 2017-03-06 DIAGNOSIS — Z23 Encounter for immunization: Secondary | ICD-10-CM | POA: Diagnosis not present

## 2017-03-06 DIAGNOSIS — I10 Essential (primary) hypertension: Secondary | ICD-10-CM

## 2017-03-06 DIAGNOSIS — D351 Benign neoplasm of parathyroid gland: Secondary | ICD-10-CM

## 2017-03-06 DIAGNOSIS — Z01818 Encounter for other preprocedural examination: Secondary | ICD-10-CM

## 2017-03-06 DIAGNOSIS — Z8669 Personal history of other diseases of the nervous system and sense organs: Secondary | ICD-10-CM

## 2017-03-06 LAB — HEMOGLOBIN A1C: Hgb A1c MFr Bld: 6 % (ref 4.6–6.5)

## 2017-03-06 LAB — BASIC METABOLIC PANEL
BUN: 15 mg/dL (ref 6–23)
CALCIUM: 10.6 mg/dL — AB (ref 8.4–10.5)
CO2: 31 mEq/L (ref 19–32)
CREATININE: 0.94 mg/dL (ref 0.40–1.20)
Chloride: 105 mEq/L (ref 96–112)
GFR: 79.12 mL/min (ref >60.00)
Glucose, Bld: 108 mg/dL — ABNORMAL HIGH (ref 70–99)
Potassium: 4.1 mEq/L (ref 3.5–5.1)
Sodium: 140 mEq/L (ref 135–145)

## 2017-03-06 LAB — HEPATIC FUNCTION PANEL
ALK PHOS: 70 U/L (ref 39–117)
ALT: 20 U/L (ref 0–35)
AST: 14 U/L (ref 0–37)
Albumin: 4.2 g/dL (ref 3.5–5.2)
BILIRUBIN DIRECT: 0.1 mg/dL (ref 0.0–0.3)
BILIRUBIN TOTAL: 0.5 mg/dL (ref 0.2–1.2)
Total Protein: 6.8 g/dL (ref 6.0–8.3)

## 2017-03-06 LAB — LIPID PANEL
CHOL/HDL RATIO: 3
Cholesterol: 180 mg/dL (ref 0–200)
HDL: 59.1 mg/dL (ref >39.00)
LDL CALC: 109 mg/dL — AB (ref 0–99)
NonHDL: 120.95
TRIGLYCERIDES: 62 mg/dL (ref 0.0–149.0)
VLDL: 12.4 mg/dL (ref 0.0–40.0)

## 2017-03-06 NOTE — Progress Notes (Signed)
Patient ID: Rachael Zimmerman, female   DOB: 1961/04/12, 56 y.o.   MRN: [id number]   Subjective:    Patient ID: Rachael Zimmerman, female    DOB: January 03, 1961, 56 y.o.   MRN: [id number]  HPI  Patient here for her physical exam.  Saw Dr Ouida Sills 07/12/16. Had pelvic exam.  Last pap - 2016 - negative with negative HPV.  Note reviewed.  Recommended f/u in one year.  She has recently been evaluated for hypercalcemia/hyperparathyroidism.  Saw Dr Harlow Asa.  Planning for right parathyroidectomy next week.  She reports she is doing well.  No chest pain.  No sob.  No acid reflux.  No abdominal pain.  Bowels moving.  Overall feels things are stable.     Past Medical History:  Diagnosis Date  . 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[place])    s/p craniectomy  . Hypertension   . Pre-diabetes    patient denies ; see lab result for a1c 03-06-17 is 6.0  . Seizures ([place])    AFTER BRAIN SURGERY; none recently   . Weakness of right side of body    d/t brain tumor intervention    Past Surgical History:  Procedure Laterality Date  . ABDOMINAL HYSTERECTOMY  2010   ovaries not removed  . BRAIN SURGERY     tumor excision s/p craniectomy  . BREAST BIOPSY Left 2010   benign  . CATARACT EXTRACTION W/PHACO Left 11/07/2016   Procedure: CATARACT EXTRACTION PHACO AND INTRAOCULAR LENS PLACEMENT (IOC);  Surgeon: Estill Cotta, MD;  Location: [facility] ORS;  Service: Ophthalmology;  Laterality: Left;  [place] 00:53.9 AP% 16.7 CDE 17.02 Fluid Pack Lot # [id number]   Family History  Problem Relation Age of Onset  . Cancer Mother        ovary  . Breast cancer Neg Hx   . Colon cancer Neg Hx    Social History   Social History  . Marital status: Legally Separated    Spouse name: N/A  . Number of children: 1  . Years of education: N/A   Occupational History  .  [employer]   Social History Main Topics  . Smoking status: Never Smoker  . Smokeless tobacco: Never Used  . Alcohol use 0.0 oz/week     Comment: seldom   .  Drug use: No  . Sexual activity: Not Asked   Other Topics Concern  . None   Social History Narrative  . None    Outpatient Encounter Prescriptions as of 03/06/2017  Medication Sig  . hydrochlorothiazide (MICROZIDE) 12.5 MG capsule TAKE 1 CAPSULE (12.5 MG TOTAL) BY MOUTH DAILY.  . [DISCONTINUED] acetaminophen (TYLENOL) 325 MG tablet Take 325 mg by mouth daily as needed for mild pain.  . [DISCONTINUED] ILEVRO 0.3 % ophthalmic suspension INSTILL 1 DROP STARTING 2 DAYS PRIOR TO SURGERY IN OP EYE AND 1 DROP AM OF SURGERY CONTINUE TILL FIN   No facility-administered encounter medications on file as of 03/06/2017.     Review of Systems  Constitutional: Negative for appetite change and unexpected weight change.  HENT: Negative for congestion and sinus pressure.   Eyes: Negative for pain and visual disturbance.  Respiratory: Negative for cough, chest tightness and shortness of breath.   Cardiovascular: Negative for chest pain, palpitations and leg swelling.  Gastrointestinal: Negative for abdominal pain, diarrhea, nausea and vomiting.  Genitourinary: Negative for difficulty urinating and dysuria.  Musculoskeletal: Negative for back pain and joint swelling.  Skin: Negative for color change and rash.  Neurological: Negative for dizziness, light-headedness and headaches.  Hematological: Negative for adenopathy. Does not bruise/bleed easily.  Psychiatric/Behavioral: Negative for agitation and dysphoric mood.       Objective:    Physical Exam  Constitutional: She is oriented to person, place, and time. She appears well-developed and well-nourished. No distress.  HENT:  Nose: Nose normal.  Mouth/Throat: Oropharynx is clear and moist.  Eyes: Right eye exhibits no discharge. Left eye exhibits no discharge. No scleral icterus.  Neck: Neck supple. No thyromegaly present.  Cardiovascular: Normal rate and regular rhythm.   Pulmonary/Chest: Breath sounds normal. No accessory muscle usage. No  tachypnea. No respiratory distress. She has no decreased breath sounds. She has no wheezes. She has no rhonchi. Right breast exhibits no inverted nipple, no mass, no nipple discharge and no tenderness (no axillary adenopathy). Left breast exhibits no inverted nipple, no mass, no nipple discharge and no tenderness (no axilarry adenopathy).  Abdominal: Soft. Bowel sounds are normal. There is no tenderness.  Musculoskeletal: She exhibits no edema or tenderness.  Lymphadenopathy:    She has no cervical adenopathy.  Neurological: She is alert and oriented to person, place, and time.  Skin: Skin is warm. No rash noted. No erythema.  Psychiatric: She has a normal mood and affect. Her behavior is normal.    BP 120/74 (BP Location: Left Arm, Patient Position: Sitting, Cuff Size: Normal)   Pulse 92   Temp 98.6 F (37 C) (Oral)   Resp 12   Ht _0  (1.6 m)   Wt 185 lb 12.8 oz (84.3 kg)   SpO2 100%   BMI 32.91 kg/m  Wt Readings from Last 3 Encounters:  03/07/17 184 lb 12.8 oz (83.8 kg)  03/06/17 185 lb 12.8 oz (84.3 kg)  12/04/16 187 lb 3.2 oz (84.9 kg)     Lab Results  Component Value Date   WBC 8.3 03/07/2017   HGB 12.8 03/07/2017   HCT 37.5 03/07/2017   PLT 158 03/07/2017   GLUCOSE 108 (H) 03/06/2017   CHOL 180 03/06/2017   TRIG 62.0 03/06/2017   HDL 59.10 03/06/2017   LDLCALC 109 (H) 03/06/2017   ALT 20 03/06/2017   AST 14 03/06/2017   NA 140 03/06/2017   K 4.1 03/06/2017   CL 105 03/06/2017   CREATININE 0.94 03/06/2017   BUN 15 03/06/2017   CO2 31 03/06/2017   TSH 1.47 11/01/2016   HGBA1C 6.0 03/06/2017    Nm Parathyroid W/spect/ct  Result Date: 11/29/2016 CLINICAL DATA:  Hypercalcemia, elevated parathormone level EXAM: NM PARATHYROID SCINTIGRAPHY AND SPECT IMAGING TECHNIQUE: Following intravenous administration of radiopharmaceutical, early and 2-hour delayed planar images were obtained in the anterior projection. Delayed triplanar SPECT images were also obtained at 2  hours. RADIOPHARMACEUTICALS:  23.85 mCi Tc-35mSestamibi IV COMPARISON:  None FINDINGS: Planar imaging: Initial normal sestamibi accumulation within the thyroid lobes. On delayed image, significant abnormal sestamibi retention is identified at the RIGHT thyroid bed, mid to questionably slightly inferiorly. SPECT imaging: Delayed SPECT imaging demonstrates a focus of significant abnormal sestamibi accumulation posterior to the mid to slightly inferior RIGHT thyroid lobe consistent with a parathyroid adenoma. This corresponds to a small soft tissue nodule measuring approximately 7 mm diameter on CT series 3, image 121. No other abnormal foci of sestamibi accumulation are identified. No ectopic localization of sestamibi in the mediastinum. IMPRESSION: Abnormal parathyroid SPECT CT imaging demonstrating a focus of abnormal sestamibi retention posterior to the mid to slightly inferior RIGHT thyroid lobe consistent with a  parathyroid adenoma and corresponding to a 7 mm diameter nodule on CT. Electronically Signed   By: Lavonia Dana M.D.   On: 11/29/2016 16:28       Assessment & Plan:   Problem List Items Addressed This Visit    Essential hypertension, benign    Blood pressure under good control.  Continue same medication regimen.  Follow pressures.  Follow metabolic panel.        Health care maintenance    Physical today 03/06/17.  Saw gyn 07/12/16.  Mammogram 08/03/16 - Birads I.  PAP 06/2014 - negative with negative HPV.  Needs colonoscopy.  Discussed with her today.  She will notify me when ready for referral.        History of brain tumor    S/p excision and craniectomy.  Has been evaluated by Dr Manuella Ghazi.        History of seizure disorder    Doing well off keppra.        Hyperglycemia    Low carb diet and exercise.  Follow met b and a1c.        Parathyroid adenoma    Saw Dr Harlow Asa.  Planning for right parathyroidectomy next week.  She is currently without chest pain.  No sob.  EKG - SR with no  acute ischemic changes.  I feels she is at low cardiac risk to proceed with planned surgery.  Will need close intra op and post op monitoring of heart rate and blood pressure to avoid extremes.         Other Visit Diagnoses    Routine general medical examination at a health care facility    -  Primary   Pre-op evaluation       Currently asymptomatic.  EKG - SR with no acute ischemic changes.  Low risk to proceed.  Will need close intraop and postop monitoring of HR and BP as outlined.   Relevant Orders   EKG 12-Lead (Completed)   Need for immunization against influenza       Relevant Orders   Flu Vaccine QUAD 36+ mos IM (Completed)       Einar Pheasant, MD

## 2017-03-06 NOTE — Assessment & Plan Note (Addendum)
Physical today 03/06/17.  Saw gyn 07/12/16.  Mammogram 08/03/16 - Birads I.  PAP 06/2014 - negative with negative HPV.  Needs colonoscopy.  Discussed with her today.  She will notify me when ready for referral.

## 2017-03-07 ENCOUNTER — Encounter (HOSPITAL_COMMUNITY)
Admission: RE | Admit: 2017-03-07 | Discharge: 2017-03-07 | Disposition: A | Discharge status: Home / Self Care | Payer: Medicare Other | Source: Ambulatory Visit | Attending: Surgery | Admitting: Surgery

## 2017-03-07 ENCOUNTER — Encounter (HOSPITAL_COMMUNITY): Payer: Self-pay

## 2017-03-07 DIAGNOSIS — E210 Primary hyperparathyroidism: Secondary | ICD-10-CM | POA: Diagnosis not present

## 2017-03-07 DIAGNOSIS — Z01812 Encounter for preprocedural laboratory examination: Secondary | ICD-10-CM | POA: Insufficient documentation

## 2017-03-07 HISTORY — DX: Weakness: R53.1

## 2017-03-07 HISTORY — DX: Prediabetes: R73.03

## 2017-03-07 LAB — CBC
HCT: 37.5 % (ref 36.0–46.0)
Hemoglobin: 12.8 g/dL (ref 12.0–15.0)
MCH: 28.6 pg (ref 26.0–34.0)
MCHC: 34.1 g/dL (ref 30.0–36.0)
MCV: 83.9 fL (ref 78.0–100.0)
PLATELETS: 158 10*3/uL (ref 150–400)
RBC: 4.47 MIL/uL (ref 3.87–5.11)
RDW: 13.8 % (ref 11.5–15.5)
WBC: 8.3 10*3/uL (ref 4.0–10.5)

## 2017-03-07 NOTE — Progress Notes (Signed)
BMP, hepatic function panel, hgA1c 03-06-17 epic  EKG 03-06-17 epic

## 2017-03-09 ENCOUNTER — Encounter: Payer: Self-pay | Admitting: Internal Medicine

## 2017-03-09 NOTE — Assessment & Plan Note (Signed)
Saw Dr Harlow Asa.  Planning for right parathyroidectomy next week.  She is currently without chest pain.  No sob.  EKG - SR with no acute ischemic changes.  I feels she is at low cardiac risk to proceed with planned surgery.  Will need close intra op and post op monitoring of heart rate and blood pressure to avoid extremes.

## 2017-03-09 NOTE — Assessment & Plan Note (Signed)
S/p excision and craniectomy.  Has been evaluated by Dr Shah.  

## 2017-03-09 NOTE — Assessment & Plan Note (Signed)
Low carb diet and exercise.  Follow met b and a1c.   

## 2017-03-09 NOTE — Assessment & Plan Note (Signed)
Blood pressure under good control.  Continue same medication regimen.  Follow pressures.  Follow metabolic panel.   

## 2017-03-09 NOTE — Assessment & Plan Note (Signed)
Doing well off keppra.   

## 2017-03-11 ENCOUNTER — Encounter (HOSPITAL_COMMUNITY): Payer: Self-pay | Admitting: Surgery

## 2017-03-11 DIAGNOSIS — E210 Primary hyperparathyroidism: Secondary | ICD-10-CM | POA: Diagnosis present

## 2017-03-11 NOTE — H&P (Signed)
General Surgery [facility]  Rachael Zimmerman 02/20/2017 9:53 AM Location: [facility] Patient #: [id number] DOB: 1961/04/10 Separated / Language: [name] / Race: Black or African American Female   History of Present Illness [name] MD; 02/20/2017 10:17 AM) The patient is a 56 year old female who presents with primary hyperparathyroidism.  CC: primary hyperparathyroidism  Patient is referred by Dr. Einar Pheasant for evaluation and management of primary hyperparathyroidism. Patient is a pleasant lady diagnosed with primary hyperparathyroidism based on elevated serum calcium levels. Lattie Haw then elevated for over one year. They range from 10.2-11.3. Patient underwent further evaluation with an intact PTH level which was elevated at 85. Patient subsequently underwent CT scan and nuclear medicine parathyroid scan in June 2018. This localized a right parathyroid adenoma measuring approximately 7 mm in size. Patient has had no complications from her hypercalcemia. She denies fatigue. She has had a bone density scan which by report was normal. She has had no recent fractures. She denies nephrolithiasis. Patient does have a significant past medical history for craniotomy for benign brain tumor in 2003. This was done at [facility]. There is no family history of parathyroid disease or other endocrine neoplasms.   Past Surgical History (Tanisha A. Owens Shark, [place]; 02/20/2017 9:53 AM) Hysterectomy (not due to cancer) - Partial   Allergies (Tanisha A. Owens Shark, [place]; 02/20/2017 9:54 AM) Penicillins  Allergies Reconciled   Medication History (Tanisha A. Owens Shark, [place]; 02/20/2017 9:54 AM) HydroCHLOROthiazide (12.5MG  Capsule, Oral) Active. Medications Reconciled  Social History (Tanisha A. Owens Shark, [place]; 02/20/2017 9:53 AM) Alcohol use  Occasional alcohol use. Caffeine use  Carbonated beverages, Tea. No drug use  Tobacco use  Never smoker.  Family History  (Tanisha A. Owens Shark, [place]; 02/20/2017 9:53 AM) Arthritis  Mother. Ovarian Cancer  Mother.    Review of Systems (Tanisha A. Brown RMA; 02/20/2017 9:53 AM) General Not Present- Appetite Loss, Chills, Fatigue, Fever, Night Sweats, Weight Gain and Weight Loss. Skin Not Present- Change in Wart/Mole, Dryness, Hives, Jaundice, New Lesions, Non-Healing Wounds, Rash and Ulcer. HEENT Not Present- Earache, Hearing Loss, Hoarseness, Nose Bleed, Oral Ulcers, Ringing in the Ears, Seasonal Allergies, Sinus Pain, Sore Throat, Visual Disturbances, Wears glasses/contact lenses and Yellow Eyes. Respiratory Not Present- Bloody sputum, Chronic Cough, Difficulty Breathing, Snoring and Wheezing. Breast Not Present- Breast Mass, Breast Pain, Nipple Discharge and Skin Changes. Cardiovascular Not Present- Chest Pain, Difficulty Breathing Lying Down, Leg Cramps, Palpitations, Rapid Heart Rate, Shortness of Breath and Swelling of Extremities. Gastrointestinal Not Present- Abdominal Pain, Bloating, Bloody Stool, Change in Bowel Habits, Chronic diarrhea, Constipation, Difficulty Swallowing, Excessive gas, Gets full quickly at meals, Hemorrhoids, Indigestion, Nausea, Rectal Pain and Vomiting. Female Genitourinary Not Present- Frequency, Nocturia, Painful Urination, Pelvic Pain and Urgency. Musculoskeletal Not Present- Back Pain, Joint Pain, Joint Stiffness, Muscle Pain, Muscle Weakness and Swelling of Extremities. Neurological Not Present- Decreased Memory, Fainting, Headaches, Numbness, Seizures, Tingling, Tremor, Trouble walking and Weakness. Psychiatric Not Present- Anxiety, Bipolar, Change in Sleep Pattern, Depression, Fearful and Frequent crying. Endocrine Not Present- Cold Intolerance, Excessive Hunger, Hair Changes, Heat Intolerance, Hot flashes and New Diabetes. Hematology Not Present- Blood Thinners, Easy Bruising, Excessive bleeding, Gland problems, HIV and Persistent Infections.  Vitals (Tanisha A. Brown RMA;  02/20/2017 9:53 AM) 02/20/2017 9:53 AM Weight: 183.2 lb Height: 62in Body Surface Area: 1.84 m Body Mass Index: 33.51 kg/m  Temp.: 97.66F  Pulse: 85 (Regular)  P.OX: 98% (Room air) BP: 128/74 (Sitting, Left Arm, Standard)  Physical Exam [name] MD; 02/20/2017 10:17 AM) The physical exam findings are as follows: Note:CONSTITUTIONAL See vital signs recorded above  GENERAL APPEARANCE Development: normal Nutritional status: normal Gross deformities: none  SKIN Rash, lesions, ulcers: none Induration, erythema: none Nodules: none palpable  EYES Conjunctiva and lids: normal Pupils: equal and reactive Iris: normal bilaterally  EARS, NOSE, MOUTH, THROAT External ears: no lesion or deformity External nose: no lesion or deformity Hearing: grossly normal Lips: no lesion or deformity Dentition: normal for age Oral mucosa: moist  NECK Symmetric: yes Trachea: midline Thyroid: no palpable nodules in the thyroid bed  CHEST Respiratory effort: normal Retraction or accessory muscle use: no Breath sounds: normal bilaterally Rales, rhonchi, wheeze: none  CARDIOVASCULAR Auscultation: regular rhythm, normal rate Murmurs: none Pulses: carotid and radial pulse 2+ palpable Lower extremity edema: none Lower extremity varicosities: none  MUSCULOSKELETAL Station and gait: unsteady, favors one side Digits and nails: no clubbing or cyanosis Muscle strength: grossly normal all extremities Range of motion: grossly normal all extremities Deformity: none  LYMPHATIC Cervical: none palpable Supraclavicular: none palpable  PSYCHIATRIC Oriented to person, place, and time: yes Mood and affect: normal for situation Judgment and insight: appropriate for situation    Assessment & Plan [name] MD; 02/20/2017 10:20 AM) PRIMARY HYPERPARATHYROIDISM (E21.0) Current Plans Pt Education - Pamphlet Given - The Parathyroid Surgery Book: discussed with patient  and provided information. Patient presents on referral from her primary care physician for evaluation and management of primary hyperparathyroidism. Patient is provided with a copy of her medical records and a copy of information regarding parathyroid surgery. She will review this at home.  Patient has biochemical evidence of primary hyperparathyroidism. Radiographic imaging localizes a right inferior parathyroid adenoma. I have recommended proceeding with minimally invasive surgery. We have discussed the risk and benefits of the procedure including the potential for recurrent laryngeal nerve injury. We have discussed the possibility of multi-gland disease. We have discussed the possibility of further surgery in the future. This will be unlikely. We have discussed the hospital stay and her postoperative recovery and return to work. We discussed the location of the surgical incision and the cosmetic results to be expected. Patient understands all of these issues and wishes to proceed with surgery in the near future.  The risks and benefits of the procedure have been discussed at length with the patient. The patient understands the proposed procedure, potential alternative treatments, and the course of recovery to be expected. All of the patient's questions have been answered at this time. The patient wishes to proceed with surgery.  Armandina Gemma, [facility] Office: [phone]

## 2017-03-13 NOTE — Anesthesia Preprocedure Evaluation (Addendum)
Anesthesia Evaluation  Patient identified by MRN, date of birth, ID band Patient awake    Reviewed: Allergy & Precautions, NPO status , Patient's Chart, lab work & pertinent test results  History of Anesthesia Complications Negative for: history of anesthetic complications  Airway Mallampati: II  TM Distance: >3 FB Neck ROM: Full    Dental no notable dental hx. (+) Dental Advisory Given   Pulmonary neg pulmonary ROS, neg sleep apnea, neg COPD,    Pulmonary exam normal - rhonchi (-) wheezing      Cardiovascular hypertension, Pt. on medications (-) CAD, (-) Past MI and (-) Cardiac Stents Normal cardiovascular exam - Systolic murmurs and - Diastolic murmurs    Neuro/Psych Craniotomy for benign tumor negative psych ROS   GI/Hepatic negative GI ROS, Neg liver ROS,   Endo/Other  negative endocrine ROSneg diabetes  Renal/GU negative Renal ROS     Musculoskeletal negative musculoskeletal ROS (+)   Abdominal (+) - obese,   Peds  Hematology negative hematology ROS (+)   Anesthesia Other Findings   Reproductive/Obstetrics                            Anesthesia Physical  Anesthesia Plan  ASA: II  Anesthesia Plan: General   Post-op Pain Management:    Induction: Intravenous  PONV Risk Score and Plan: 4 or greater and Ondansetron, Dexamethasone, Scopolamine patch - Pre-op and Diphenhydramine  Airway Management Planned: Oral ETT  Additional Equipment:   Intra-op Plan:   Post-operative Plan: Extubation in OR  Informed Consent: I have reviewed the patients History and Physical, chart, labs and discussed the procedure including the risks, benefits and alternatives for the proposed anesthesia with the patient or authorized representative who has indicated his/her understanding and acceptance.   Dental advisory given  Plan Discussed with: CRNA and Anesthesiologist  Anesthesia Plan Comments:         Anesthesia Quick Evaluation

## 2017-03-14 ENCOUNTER — Encounter (HOSPITAL_COMMUNITY): Payer: Self-pay

## 2017-03-14 ENCOUNTER — Ambulatory Visit (HOSPITAL_COMMUNITY): Payer: Medicare Other | Admitting: Anesthesiology

## 2017-03-14 ENCOUNTER — Encounter (HOSPITAL_COMMUNITY)
Admission: RE | Disposition: A | Discharge status: Home / Self Care | Payer: Self-pay | Source: Ambulatory Visit | Attending: Surgery

## 2017-03-14 ENCOUNTER — Ambulatory Visit (HOSPITAL_COMMUNITY)
Admission: RE | Admit: 2017-03-14 | Discharge: 2017-03-14 | Disposition: A | Discharge status: Home / Self Care | Payer: Medicare Other | Source: Ambulatory Visit | Attending: Surgery | Admitting: Surgery

## 2017-03-14 DIAGNOSIS — Z86011 Personal history of benign neoplasm of the brain: Secondary | ICD-10-CM | POA: Diagnosis not present

## 2017-03-14 DIAGNOSIS — Z6832 Body mass index (BMI) 32.0-32.9, adult: Secondary | ICD-10-CM | POA: Diagnosis not present

## 2017-03-14 DIAGNOSIS — D351 Benign neoplasm of parathyroid gland: Secondary | ICD-10-CM | POA: Diagnosis not present

## 2017-03-14 DIAGNOSIS — D34 Benign neoplasm of thyroid gland: Secondary | ICD-10-CM | POA: Insufficient documentation

## 2017-03-14 DIAGNOSIS — E041 Nontoxic single thyroid nodule: Secondary | ICD-10-CM | POA: Diagnosis not present

## 2017-03-14 DIAGNOSIS — Z90710 Acquired absence of both cervix and uterus: Secondary | ICD-10-CM | POA: Diagnosis not present

## 2017-03-14 DIAGNOSIS — M25561 Pain in right knee: Secondary | ICD-10-CM | POA: Diagnosis not present

## 2017-03-14 DIAGNOSIS — E669 Obesity, unspecified: Secondary | ICD-10-CM | POA: Insufficient documentation

## 2017-03-14 DIAGNOSIS — I10 Essential (primary) hypertension: Secondary | ICD-10-CM | POA: Insufficient documentation

## 2017-03-14 DIAGNOSIS — Z8041 Family history of malignant neoplasm of ovary: Secondary | ICD-10-CM | POA: Diagnosis not present

## 2017-03-14 DIAGNOSIS — Z79899 Other long term (current) drug therapy: Secondary | ICD-10-CM | POA: Diagnosis not present

## 2017-03-14 DIAGNOSIS — Z880 Allergy status to penicillin: Secondary | ICD-10-CM | POA: Diagnosis not present

## 2017-03-14 DIAGNOSIS — E210 Primary hyperparathyroidism: Secondary | ICD-10-CM | POA: Diagnosis present

## 2017-03-14 HISTORY — PX: PARATHYROIDECTOMY: SHX19

## 2017-03-14 SURGERY — PARATHYROIDECTOMY
Anesthesia: General | Site: Neck | Laterality: Right

## 2017-03-14 MED ORDER — PROMETHAZINE HCL 25 MG/ML IJ SOLN: 6.2500 mg | INTRAMUSCULAR | Status: DC | PRN

## 2017-03-14 MED ORDER — DIPHENHYDRAMINE HCL 50 MG/ML IJ SOLN
INTRAMUSCULAR | Status: DC | PRN
  Administered 2017-03-14: 12.5 mg via INTRAVENOUS

## 2017-03-14 MED ORDER — PROPOFOL 10 MG/ML IV BOLUS
INTRAVENOUS | Status: DC | PRN
  Administered 2017-03-14: 150 mg via INTRAVENOUS

## 2017-03-14 MED ORDER — LIDOCAINE 2% (20 MG/ML) 5 ML SYRINGE
INTRAMUSCULAR | Status: AC
Start: 2017-03-14 — End: 2017-03-14
  Filled 2017-03-14: qty 5

## 2017-03-14 MED ORDER — HYDROMORPHONE HCL-NACL 0.5-0.9 MG/ML-% IV SOSY
0.2500 mg | PREFILLED_SYRINGE | INTRAVENOUS | Status: DC | PRN
  Administered 2017-03-14: 0.5 mg via INTRAVENOUS

## 2017-03-14 MED ORDER — CHLORHEXIDINE GLUCONATE CLOTH 2 % EX PADS: 6.0000 | MEDICATED_PAD | Freq: Once | CUTANEOUS | Status: DC

## 2017-03-14 MED ORDER — MIDAZOLAM HCL 5 MG/5ML IJ SOLN
INTRAMUSCULAR | Status: DC | PRN
  Administered 2017-03-14 (×2): 0.5 mg via INTRAVENOUS

## 2017-03-14 MED ORDER — SCOPOLAMINE 1 MG/3DAYS TD PT72
1.0000 | MEDICATED_PATCH | TRANSDERMAL | Status: DC
  Administered 2017-03-14: 1.5 mg via TRANSDERMAL
  Filled 2017-03-14: qty 1

## 2017-03-14 MED ORDER — BUPIVACAINE HCL (PF) 0.25 % IJ SOLN
INTRAMUSCULAR | Status: AC
  Filled 2017-03-14: qty 30

## 2017-03-14 MED ORDER — 0.9 % SODIUM CHLORIDE (POUR BTL) OPTIME
TOPICAL | Status: DC | PRN
Start: 2017-03-14 — End: 2017-03-14
  Administered 2017-03-14: 1000 mL

## 2017-03-14 MED ORDER — LIDOCAINE HCL (CARDIAC) 20 MG/ML IV SOLN
INTRAVENOUS | Status: DC | PRN
  Administered 2017-03-14: 100 mg via INTRAVENOUS

## 2017-03-14 MED ORDER — SUGAMMADEX SODIUM 200 MG/2ML IV SOLN
INTRAVENOUS | Status: AC
  Filled 2017-03-14: qty 2

## 2017-03-14 MED ORDER — ONDANSETRON HCL 4 MG/2ML IJ SOLN
INTRAMUSCULAR | Status: AC
  Filled 2017-03-14: qty 2

## 2017-03-14 MED ORDER — HYDROMORPHONE HCL-NACL 0.5-0.9 MG/ML-% IV SOSY
0.2500 mg | PREFILLED_SYRINGE | INTRAVENOUS | Status: DC | PRN
  Administered 2017-03-14 (×2): 0.5 mg via INTRAVENOUS

## 2017-03-14 MED ORDER — DEXAMETHASONE SODIUM PHOSPHATE 10 MG/ML IJ SOLN
INTRAMUSCULAR | Status: DC | PRN
  Administered 2017-03-14: 10 mg via INTRAVENOUS

## 2017-03-14 MED ORDER — PROPOFOL 10 MG/ML IV BOLUS
INTRAVENOUS | Status: AC
  Filled 2017-03-14: qty 20

## 2017-03-14 MED ORDER — FENTANYL CITRATE (PF) 100 MCG/2ML IJ SOLN
INTRAMUSCULAR | Status: DC | PRN
  Administered 2017-03-14: 50 ug via INTRAVENOUS

## 2017-03-14 MED ORDER — CIPROFLOXACIN IN D5W 400 MG/200ML IV SOLN
400.0000 mg | INTRAVENOUS | Status: AC
  Administered 2017-03-14: 400 mg via INTRAVENOUS

## 2017-03-14 MED ORDER — BUPIVACAINE HCL 0.25 % IJ SOLN
INTRAMUSCULAR | Status: DC | PRN
  Administered 2017-03-14: 10 mL

## 2017-03-14 MED ORDER — DEXAMETHASONE SODIUM PHOSPHATE 10 MG/ML IJ SOLN
INTRAMUSCULAR | Status: AC
  Filled 2017-03-14: qty 1

## 2017-03-14 MED ORDER — SUCCINYLCHOLINE CHLORIDE 20 MG/ML IJ SOLN
INTRAMUSCULAR | Status: DC | PRN
Start: 2017-03-14 — End: 2017-03-14
  Administered 2017-03-14: 140 mg via INTRAVENOUS

## 2017-03-14 MED ORDER — MIDAZOLAM HCL 2 MG/2ML IJ SOLN
INTRAMUSCULAR | Status: AC
  Filled 2017-03-14: qty 2

## 2017-03-14 MED ORDER — FENTANYL CITRATE (PF) 250 MCG/5ML IJ SOLN
INTRAMUSCULAR | Status: AC
Start: 2017-03-14 — End: 2017-03-14
  Filled 2017-03-14: qty 5

## 2017-03-14 MED ORDER — LACTATED RINGERS IV SOLN
INTRAVENOUS | Status: DC
  Administered 2017-03-14 (×2): via INTRAVENOUS
  Administered 2017-03-14: 1000 mL via INTRAVENOUS

## 2017-03-14 MED ORDER — SCOPOLAMINE 1 MG/3DAYS TD PT72: 1.0000 | MEDICATED_PATCH | TRANSDERMAL | Status: DC

## 2017-03-14 MED ORDER — ROCURONIUM BROMIDE 100 MG/10ML IV SOLN
INTRAVENOUS | Status: DC | PRN
  Administered 2017-03-14 (×2): 10 mg via INTRAVENOUS
  Administered 2017-03-14: 30 mg via INTRAVENOUS

## 2017-03-14 MED ORDER — ROCURONIUM BROMIDE 50 MG/5ML IV SOSY
PREFILLED_SYRINGE | INTRAVENOUS | Status: AC
  Filled 2017-03-14: qty 5

## 2017-03-14 MED ORDER — ONDANSETRON HCL 4 MG/2ML IJ SOLN
INTRAMUSCULAR | Status: DC | PRN
  Administered 2017-03-14: 4 mg via INTRAVENOUS

## 2017-03-14 MED ORDER — HYDROCODONE-ACETAMINOPHEN 5-325 MG PO TABS: 1.0000 | ORAL_TABLET | ORAL | 0 refills | Status: DC | PRN

## 2017-03-14 MED ORDER — CIPROFLOXACIN IN D5W 400 MG/200ML IV SOLN
INTRAVENOUS | Status: AC
  Filled 2017-03-14: qty 200

## 2017-03-14 MED ORDER — SUGAMMADEX SODIUM 500 MG/5ML IV SOLN
INTRAVENOUS | Status: DC | PRN
  Administered 2017-03-14: 300 mg via INTRAVENOUS

## 2017-03-14 MED ORDER — HYDROMORPHONE HCL-NACL 0.5-0.9 MG/ML-% IV SOSY
PREFILLED_SYRINGE | INTRAVENOUS | Status: AC
  Filled 2017-03-14: qty 2

## 2017-03-14 MED ORDER — HYDROMORPHONE HCL-NACL 0.5-0.9 MG/ML-% IV SOSY
PREFILLED_SYRINGE | INTRAVENOUS | Status: AC
  Filled 2017-03-14: qty 1

## 2017-03-14 SURGICAL SUPPLY — 37 items
ATTRACTOMAT 16X20 MAGNETIC DRP (DRAPES) ×2 IMPLANT
BLADE HEX COATED 2.75 (ELECTRODE) IMPLANT
BLADE SURG 15 STRL LF DISP TIS (BLADE) ×1 IMPLANT
BLADE SURG 15 STRL SS (BLADE) ×1
CHLORAPREP W/TINT 26ML (MISCELLANEOUS) ×2 IMPLANT
CLIP VESOCCLUDE MED 6/CT (CLIP) ×4 IMPLANT
CLIP VESOCCLUDE SM WIDE 6/CT (CLIP) ×4 IMPLANT
DERMABOND ADVANCED (GAUZE/BANDAGES/DRESSINGS)
DERMABOND ADVANCED .7 DNX12 (GAUZE/BANDAGES/DRESSINGS) IMPLANT
DRAPE LAPAROTOMY T 98X78 PEDS (DRAPES) ×2 IMPLANT
ELECT PENCIL ROCKER SW 15FT (MISCELLANEOUS) ×2 IMPLANT
ELECT REM PT RETURN 15FT ADLT (MISCELLANEOUS) ×2 IMPLANT
GAUZE SPONGE 4X4 12PLY STRL (GAUZE/BANDAGES/DRESSINGS) ×2 IMPLANT
GAUZE SPONGE 4X4 16PLY XRAY LF (GAUZE/BANDAGES/DRESSINGS) ×2 IMPLANT
GLOVE SURG ORTHO 8.0 STRL STRW (GLOVE) ×2 IMPLANT
GOWN STRL REUS W/TWL XL LVL3 (GOWN DISPOSABLE) ×8 IMPLANT
HEMOSTAT SURGICEL 2X4 FIBR (HEMOSTASIS) IMPLANT
ILLUMINATOR WAVEGUIDE N/F (MISCELLANEOUS) IMPLANT
KIT BASIN OR (CUSTOM PROCEDURE TRAY) ×2 IMPLANT
LIGHT WAVEGUIDE WIDE FLAT (MISCELLANEOUS) IMPLANT
NEEDLE HYPO 25X1 1.5 SAFETY (NEEDLE) ×2 IMPLANT
PACK BASIC VI WITH GOWN DISP (CUSTOM PROCEDURE TRAY) ×2 IMPLANT
POWDER SURGICEL 3.0 GRAM (HEMOSTASIS) IMPLANT
STAPLER VISISTAT 35W (STAPLE) IMPLANT
STRIP CLOSURE SKIN 1/2X4 (GAUZE/BANDAGES/DRESSINGS) ×4 IMPLANT
SUT MNCRL AB 4-0 PS2 18 (SUTURE) ×2 IMPLANT
SUT SILK 2 0 (SUTURE)
SUT SILK 2-0 18XBRD TIE 12 (SUTURE) IMPLANT
SUT SILK 3 0 (SUTURE)
SUT SILK 3-0 18XBRD TIE 12 (SUTURE) IMPLANT
SUT VIC AB 3-0 SH 18 (SUTURE) ×2 IMPLANT
SYR BULB IRRIGATION 50ML (SYRINGE) ×2 IMPLANT
SYR CONTROL 10ML LL (SYRINGE) ×2 IMPLANT
TAPE CLOTH SURG 4X10 WHT LF (GAUZE/BANDAGES/DRESSINGS) ×2 IMPLANT
TOWEL OR 17X26 10 PK STRL BLUE (TOWEL DISPOSABLE) ×2 IMPLANT
TOWEL OR NON WOVEN STRL DISP B (DISPOSABLE) ×2 IMPLANT
YANKAUER SUCT BULB TIP 10FT TU (MISCELLANEOUS) ×2 IMPLANT

## 2017-03-14 NOTE — Op Note (Signed)
OPERATIVE REPORT - PARATHYROIDECTOMY  Preoperative diagnosis: Primary hyperparathyroidism  Postop diagnosis: Same  Procedure: Right inferior minimally invasive parathyroidectomy  Surgeon:  [name], MD, FACS  Anesthesia: Gen. endotracheal  Estimated blood loss: Minimal  Preparation: ChloraPrep  Indications: The patient is a 56 year old female who presents with primary hyperparathyroidism.  Patient is referred by Dr. Einar Pheasant for evaluation and management of primary hyperparathyroidism. Patient is a pleasant lady diagnosed with primary hyperparathyroidism based on elevated serum calcium levels. Lattie Haw then elevated for over one year. They range from 10.2-11.3. Patient underwent further evaluation with an intact PTH level which was elevated at 85. Patient subsequently underwent CT scan and nuclear medicine parathyroid scan in June 2018. This localized a right parathyroid adenoma measuring approximately 7 mm in size. Patient has had no complications from her hypercalcemia. She denies fatigue. She has had a bone density scan which by report was normal. She has had no recent fractures. She denies nephrolithiasis. Patient does have a significant past medical history for craniotomy for benign brain tumor in 2003. This was done at [facility]. There is no family history of parathyroid disease or other endocrine neoplasms.  Procedure: The patient was prepared in the pre-operative holding area. The patient was brought to the operating room and placed in a supine position on the operating room table. Following administration of general anesthesia, the patient was positioned and then prepped and draped in the usual strict aseptic fashion. After ascertaining that an adequate level of anesthesia been achieved, a neck incision was made with a #15 blade. Dissection was carried through subcutaneous tissues and platysma. Hemostasis was obtained with the electrocautery. Skin flaps were  developed circumferentially and a Weitlander retractor was placed for exposure.  Strap muscles were incised in the midline. Strap muscles were reflected lateralley exposing the thyroid lobe. With gentle blunt dissection the thyroid lobe was mobilized.  Dissection was carried through adipose tissue and an enlarged parathyroid gland was identified. It was gently mobilized. Vascular structures were divided between small and medium ligaclips. Care was taken to avoid the recurrent laryngeal nerve and the esophagus. The parathyroid gland was completely excised. It was submitted to pathology where frozen section confirmed parathyroid tissue consistent with adenoma.  The adenoma was in the superior portion of the right thyrothymic tract.  This did not precisely correlate with the scan.  Therefore further exploration posterior to the right thyroid lobe was performed.  A small nodule was noted and excised.  This appeared to be thyroid tissue and was submitted to pathology for permanent sectioning only.  Neck was irrigated with warm saline and good hemostasis was noted. Fibrillar was placed in the operative field. Strap muscles were reapproximated in the midline with interrupted 3-0 Vicryl sutures. Platysma was closed with interrupted 3-0 Vicryl sutures. Skin was closed with a running 4-0 Monocryl subcuticular suture. Marcaine was infiltrated circumferentially. Wound was washed and dried and Steristrips were applied. Patient was awakened from anesthesia and brought to the recovery room. The patient tolerated the procedure well.   [name], MD, [facility]

## 2017-03-14 NOTE — Anesthesia Postprocedure Evaluation (Signed)
Anesthesia Post Note  Patient: Rachael Zimmerman  Procedure(s) Performed: RIGHT PARATHYROIDECTOMY (Right Neck)     Patient location during evaluation: PACU Anesthesia Type: General Level of consciousness: sedated Pain management: pain level controlled Vital Signs Assessment: post-procedure vital signs reviewed and stable Respiratory status: spontaneous breathing and respiratory function stable Cardiovascular status: stable Postop Assessment: no apparent nausea or vomiting Anesthetic complications: no    Last Vitals:  Vitals:   03/14/17 1015 03/14/17 1016  BP: (!) 147/83   Pulse: 72 71  Resp: 19 16  Temp:    SpO2: 95% 96%    Last Pain:  Vitals:   03/14/17 1000  TempSrc:   PainSc: 0-No pain                 Shakim Faith DANIEL

## 2017-03-14 NOTE — Interval H&P Note (Signed)
History and Physical Interval Note:  03/14/2017 7:23 AM  Rachael Zimmerman  has presented today for surgery, with the diagnosis of primary hyperparathyroidism.  The various methods of treatment have been discussed with the patient and family. After consideration of risks, benefits and other options for treatment, the patient has consented to    Procedure(s): RIGHT PARATHYROIDECTOMY (Right) as a surgical intervention .    The patient's history has been reviewed, patient examined, no change in status, stable for surgery.  I have reviewed the patient's chart and labs.  Questions were answered to the patient's satisfaction.    Armandina Gemma, [place] [facility] Office: [place]

## 2017-03-14 NOTE — Discharge Instructions (Signed)
General Anesthesia, Adult, Care After °These instructions provide you with information about caring for yourself after your procedure. Your health care provider may also give you more specific instructions. Your treatment has been planned according to current medical practices, but problems sometimes occur. Call your health care provider if you have any problems or questions after your procedure. °What can I expect after the procedure? °After the procedure, it is common to have: °· Vomiting. °· A sore throat. °· Mental slowness. ° °It is common to feel: °· Nauseous. °· Cold or shivery. °· Sleepy. °· Tired. °· Sore or achy, even in parts of your body where you did not have surgery. ° °Follow these instructions at home: °For at least 24 hours after the procedure: °· Do not: °? Participate in activities where you could fall or become injured. °? Drive. °? Use heavy machinery. °? Drink alcohol. °? Take sleeping pills or medicines that cause drowsiness. °? Make important decisions or sign legal documents. °? Take care of children on your own. °· Rest. °Eating and drinking °· If you vomit, drink water, juice, or soup when you can drink without vomiting. °· Drink enough fluid to keep your urine clear or pale yellow. °· Make sure you have little or no nausea before eating solid foods. °· Follow the diet recommended by your health care provider. °General instructions °· Have a responsible adult stay with you until you are awake and alert. °· Return to your normal activities as told by your health care provider. Ask your health care provider what activities are safe for you. °· Take over-the-counter and prescription medicines only as told by your health care provider. °· If you smoke, do not smoke without supervision. °· Keep all follow-up visits as told by your health care provider. This is important. °Contact a health care provider if: °· You continue to have nausea or vomiting at home, and medicines are not helpful. °· You  cannot drink fluids or start eating again. °· You cannot urinate after 8-12 hours. °· You develop a skin rash. °· You have fever. °· You have increasing redness at the site of your procedure. °Get help right away if: °· You have difficulty breathing. °· You have chest pain. °· You have unexpected bleeding. °· You feel that you are having a life-threatening or urgent problem. °This information is not intended to replace advice given to you by your health care provider. Make sure you discuss any questions you have with your health care provider. °Document Released: 09/03/2000 Document Revised: 10/31/2015 Document Reviewed: 05/12/2015 °Elsevier Interactive Patient Education © 2018 [employer] ° °

## 2017-03-14 NOTE — Transfer of Care (Signed)
Immediate Anesthesia Transfer of Care Note  Patient: Rachael Zimmerman  Procedure(s) Performed: RIGHT PARATHYROIDECTOMY (Right Neck)  Patient Location: PACU  Anesthesia Type:General  Level of Consciousness: awake, drowsy, pateint uncooperative, confused and responds to stimulation  Airway & Oxygen Therapy: Patient Spontanous Breathing and Patient connected to face mask oxygen  Post-op Assessment: Report given to RN, Post -op Vital signs reviewed and stable and Patient moving all extremities  Post vital signs: Reviewed and stable  Last Vitals:  Vitals:   03/14/17 0538  BP: (!) 143/80  Pulse: 77  Resp: 18  Temp: 36.7 C  SpO2: 99%    Last Pain:  Vitals:   03/14/17 0623  TempSrc:   PainSc: 0-No pain         Complications: No apparent anesthesia complications

## 2017-03-14 NOTE — Anesthesia Procedure Notes (Signed)
Procedure Name: Intubation Date/Time: 03/14/2017 7:39 AM Performed by: Duane Boston Pre-anesthesia Checklist: Patient identified, Emergency Drugs available, Suction available, Patient being monitored and Timeout performed Patient Re-evaluated:Patient Re-evaluated prior to induction Oxygen Delivery Method: Circle system utilized Preoxygenation: Pre-oxygenation with 100% oxygen Induction Type: IV induction and Cricoid Pressure applied Ventilation: Mask ventilation without difficulty Laryngoscope Size: Mac and 3 Grade View: Grade II Tube type: Reinforced Tube size: 7.0 mm Number of attempts: 1 Airway Equipment and Method: Stylet Placement Confirmation: ETT inserted through vocal cords under direct vision,  positive ETCO2 and breath sounds checked- equal and bilateral Secured at: 21 cm Tube secured with: Tape Dental Injury: Teeth and Oropharynx as per pre-operative assessment

## 2017-04-01 DIAGNOSIS — E210 Primary hyperparathyroidism: Secondary | ICD-10-CM | POA: Diagnosis not present

## 2017-06-13 ENCOUNTER — Encounter: Payer: Self-pay | Admitting: Internal Medicine

## 2017-06-13 ENCOUNTER — Ambulatory Visit (INDEPENDENT_AMBULATORY_CARE_PROVIDER_SITE_OTHER): Payer: Medicare Other | Admitting: Internal Medicine

## 2017-06-13 VITALS — BP 130/82 | HR 83 | Temp 98.3°F | Ht 63.0 in | Wt 185.2 lb

## 2017-06-13 DIAGNOSIS — Z1239 Encounter for other screening for malignant neoplasm of breast: Secondary | ICD-10-CM

## 2017-06-13 DIAGNOSIS — I10 Essential (primary) hypertension: Secondary | ICD-10-CM

## 2017-06-13 DIAGNOSIS — E210 Primary hyperparathyroidism: Secondary | ICD-10-CM

## 2017-06-13 DIAGNOSIS — Z1231 Encounter for screening mammogram for malignant neoplasm of breast: Secondary | ICD-10-CM

## 2017-06-13 DIAGNOSIS — Z87898 Personal history of other specified conditions: Secondary | ICD-10-CM

## 2017-06-13 DIAGNOSIS — R739 Hyperglycemia, unspecified: Secondary | ICD-10-CM

## 2017-06-13 DIAGNOSIS — D351 Benign neoplasm of parathyroid gland: Secondary | ICD-10-CM

## 2017-06-13 LAB — LIPID PANEL
CHOLESTEROL: 177 mg/dL (ref 0–200)
HDL: 54.3 mg/dL (ref >39.00)
LDL CALC: 105 mg/dL — AB (ref 0–99)
NonHDL: 122.32
TRIGLYCERIDES: 87 mg/dL (ref 0.0–149.0)
Total CHOL/HDL Ratio: 3
VLDL: 17.4 mg/dL (ref 0.0–40.0)

## 2017-06-13 LAB — BASIC METABOLIC PANEL
BUN: 14 mg/dL (ref 6–23)
CALCIUM: 10.2 mg/dL (ref 8.4–10.5)
CHLORIDE: 104 meq/L (ref 96–112)
CO2: 31 meq/L (ref 19–32)
CREATININE: 0.9 mg/dL (ref 0.40–1.20)
GFR: 83.11 mL/min (ref >60.00)
Glucose, Bld: 99 mg/dL (ref 70–99)
Potassium: 3.9 mEq/L (ref 3.5–5.1)
Sodium: 139 mEq/L (ref 135–145)

## 2017-06-13 LAB — HEPATIC FUNCTION PANEL
ALBUMIN: 4.1 g/dL (ref 3.5–5.2)
ALT: 17 U/L (ref 0–35)
AST: 12 U/L (ref 0–37)
Alkaline Phosphatase: 79 U/L (ref 39–117)
BILIRUBIN TOTAL: 0.5 mg/dL (ref 0.2–1.2)
Bilirubin, Direct: 0.1 mg/dL (ref 0.0–0.3)
Total Protein: 7.3 g/dL (ref 6.0–8.3)

## 2017-06-13 LAB — HEMOGLOBIN A1C: HEMOGLOBIN A1C: 6.3 % (ref 4.6–6.5)

## 2017-06-13 NOTE — Progress Notes (Signed)
Pre visit review using our clinic review tool, if applicable. No additional management support is needed unless otherwise documented below in the visit note. 

## 2017-06-13 NOTE — Assessment & Plan Note (Signed)
Blood pressure on recheck improved.  Same medication regimen.  Follow pressures.  Follow metabolic panel.   

## 2017-06-13 NOTE — Assessment & Plan Note (Signed)
Low carb diet and exercise.  Follow met b and a1c.  Recheck cholesterol.

## 2017-06-13 NOTE — Assessment & Plan Note (Signed)
S/p parathyroidectomy.  Followed by Dr Harlow Asa.  Recheck calcium today.  Keep f/u with Dr Harlow Asa.

## 2017-06-13 NOTE — Assessment & Plan Note (Signed)
S/p excision and craniectomy.  Has been evaluated by Dr Shah.  

## 2017-06-13 NOTE — Assessment & Plan Note (Signed)
S/p parathyroidectomy.  Seeing Dr Harlow Asa.  Recheck metabolic panel today.  Keep f/u with Dr Harlow Asa.

## 2017-06-13 NOTE — Progress Notes (Signed)
Patient ID: Rachael Zimmerman, female   DOB: 1960/09/07, 57 y.o.   MRN: [id number]   Subjective:    Patient ID: Rachael Zimmerman, female    DOB: September 05, 1960, 57 y.o.   MRN: [id number]  HPI  Patient here for a scheduled follow up.  She is s/p parathyroidectomy 03/14/17 (by Dr Harlow Asa).  Pathology results - benign.  Last evaluated 10//25/18.  recommended f/u in 6 months.  She is doing well.  Blood pressures on outside checks have been averaging 128-130/70s.  Tries to stay active.  No chest pain.  No sob.  No abdominal pain.  Bowels moving.  Discussed colon evaluation and need for colon screening.     Past Medical History:  Diagnosis Date  . 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[place])    s/p craniectomy  . Hypertension   . Pre-diabetes    patient denies ; see lab result for a1c 03-06-17 is 6.0  . Seizures ([place])    AFTER BRAIN SURGERY; none recently   . Weakness of right side of body    d/t brain tumor intervention    Past Surgical History:  Procedure Laterality Date  . ABDOMINAL HYSTERECTOMY  2010   ovaries not removed  . BRAIN SURGERY     tumor excision s/p craniectomy  . BREAST BIOPSY Left 2010   benign  . CATARACT EXTRACTION W/PHACO Left 11/07/2016   Procedure: CATARACT EXTRACTION PHACO AND INTRAOCULAR LENS PLACEMENT (IOC);  Surgeon: Estill Cotta, MD;  Location: [facility] ORS;  Service: Ophthalmology;  Laterality: Left;  [place] 00:53.9 AP% 16.7 CDE 17.02 Fluid Pack Lot # [id number]  . PARATHYROIDECTOMY Right 03/14/2017   Procedure: RIGHT PARATHYROIDECTOMY;  Surgeon: Armandina Gemma, MD;  Location: [facility];  Service: General;  Laterality: Right;   Family History  Problem Relation Age of Onset  . Cancer Mother        ovary  . Breast cancer Neg Hx   . Colon cancer Neg Hx    Social History   Socioeconomic History  . Marital status: Legally Separated    Spouse name: None  . Number of children: 1  . Years of education: None  . Highest education level: None  Social Needs  . Financial resource strain: None    . Food insecurity - worry: None  . Food insecurity - inability: None  . Transportation needs - medical: None  . Transportation needs - non-medical: None  Occupational History    Employer: [employer]  Tobacco Use  . Smoking status: Never Smoker  . Smokeless tobacco: Never Used  Substance and Sexual Activity  . Alcohol use: Yes    Alcohol/week: 0.0 oz    Comment: seldom   . Drug use: No  . Sexual activity: None  Other Topics Concern  . None  Social History Narrative  . None    Outpatient Encounter Medications as of 06/13/2017  Medication Sig  . hydrochlorothiazide (MICROZIDE) 12.5 MG capsule TAKE 1 CAPSULE (12.5 MG TOTAL) BY MOUTH DAILY.  . influenza vac recom quadrivalent (FLUBLOK) 0.5 ML injection Inject 0.5 mLs into the muscle once.  . [DISCONTINUED] HYDROcodone-acetaminophen (NORCO/VICODIN) 5-325 MG tablet Take 1-2 tablets by mouth every 4 (four) hours as needed for moderate pain.   No facility-administered encounter medications on file as of 06/13/2017.     Review of Systems  Constitutional: Negative for appetite change and unexpected weight change.  HENT: Negative for congestion and sinus pressure.   Respiratory: Negative for cough, chest tightness and shortness of breath.  Cardiovascular: Negative for chest pain and palpitations.       Previous ankle swelling.  Better now.  No calf or leg swelling.    Gastrointestinal: Negative for abdominal pain, diarrhea, nausea and vomiting.  Genitourinary: Negative for difficulty urinating and dysuria.  Musculoskeletal: Negative for back pain and myalgias.  Skin: Negative for color change and rash.  Neurological: Negative for dizziness, light-headedness and headaches.  Psychiatric/Behavioral: Negative for agitation and dysphoric mood.       Objective:     Blood pressure rechecked by me:  130/82  Physical Exam  Constitutional: She appears well-developed and well-nourished. No distress.  HENT:  Nose: Nose  normal.  Mouth/Throat: Oropharynx is clear and moist.  Neck: Neck supple. No thyromegaly present.  Cardiovascular: Normal rate and regular rhythm.  Pulmonary/Chest: Breath sounds normal. No respiratory distress. She has no wheezes.  Abdominal: Soft. Bowel sounds are normal. There is no tenderness.  Musculoskeletal: She exhibits no edema or tenderness.  Lymphadenopathy:    She has no cervical adenopathy.  Skin: No rash noted. No erythema.  Psychiatric: She has a normal mood and affect. Her behavior is normal.    BP 130/82   Pulse 83   Temp 98.3 F (36.8 C) (Oral)   Ht 5' 3"  (1.6 m)   Wt 185 lb 3.2 oz (84 kg)   SpO2 98%   BMI 32.81 kg/m  Wt Readings from Last 3 Encounters:  06/13/17 185 lb 3.2 oz (84 kg)  03/14/17 184 lb (83.5 kg)  03/07/17 184 lb 12.8 oz (83.8 kg)     Lab Results  Component Value Date   WBC 8.3 03/07/2017   HGB 12.8 03/07/2017   HCT 37.5 03/07/2017   PLT 158 03/07/2017   GLUCOSE 108 (H) 03/06/2017   CHOL 180 03/06/2017   TRIG 62.0 03/06/2017   HDL 59.10 03/06/2017   LDLCALC 109 (H) 03/06/2017   ALT 20 03/06/2017   AST 14 03/06/2017   NA 140 03/06/2017   K 4.1 03/06/2017   CL 105 03/06/2017   CREATININE 0.94 03/06/2017   BUN 15 03/06/2017   CO2 31 03/06/2017   TSH 1.47 11/01/2016   HGBA1C 6.0 03/06/2017       Assessment & Plan:   Problem List Items Addressed This Visit    Essential hypertension, benign    Blood pressure on recheck improved.  Same medication regimen.  Follow pressures.  Follow metabolic panel.        Relevant Orders   Hepatic function panel   Lipid panel   History of brain tumor    S/p excision and craniectomy.  Has been evaluated by Dr Manuella Ghazi.        Hyperglycemia    Low carb diet and exercise.  Follow met b and a1c.  Recheck cholesterol.        Relevant Orders   Hemoglobin A1c   Hyperparathyroidism, primary ([place]) - Primary    S/p parathyroidectomy.  Followed by Dr Harlow Asa.  Recheck calcium today.  Keep f/u with Dr  Harlow Asa.        Relevant Orders   Basic metabolic panel   Parathyroid adenoma    S/p parathyroidectomy.  Seeing Dr Harlow Asa.  Recheck metabolic panel today.  Keep f/u with Dr Harlow Asa.         Other Visit Diagnoses    Breast cancer screening       Relevant Orders   MM DIGITAL SCREENING BILATERAL       Einar Pheasant, MD

## 2017-07-19 ENCOUNTER — Other Ambulatory Visit: Payer: Self-pay | Admitting: Internal Medicine

## 2017-07-19 DIAGNOSIS — Z1231 Encounter for screening mammogram for malignant neoplasm of breast: Secondary | ICD-10-CM | POA: Diagnosis not present

## 2017-07-19 DIAGNOSIS — Z1211 Encounter for screening for malignant neoplasm of colon: Secondary | ICD-10-CM | POA: Diagnosis not present

## 2017-07-19 DIAGNOSIS — Z124 Encounter for screening for malignant neoplasm of cervix: Secondary | ICD-10-CM | POA: Diagnosis not present

## 2017-07-19 DIAGNOSIS — Z01419 Encounter for gynecological examination (general) (routine) without abnormal findings: Secondary | ICD-10-CM | POA: Diagnosis not present

## 2017-08-06 ENCOUNTER — Ambulatory Visit
Admission: RE | Admit: 2017-08-06 | Discharge: 2017-08-06 | Disposition: A | Discharge status: Home / Self Care | Payer: Medicare Other | Source: Ambulatory Visit | Attending: Internal Medicine | Admitting: Internal Medicine

## 2017-08-06 DIAGNOSIS — Z1231 Encounter for screening mammogram for malignant neoplasm of breast: Secondary | ICD-10-CM | POA: Diagnosis not present

## 2017-08-06 DIAGNOSIS — Z1239 Encounter for other screening for malignant neoplasm of breast: Secondary | ICD-10-CM

## 2017-08-10 ENCOUNTER — Other Ambulatory Visit: Payer: Self-pay

## 2017-08-10 ENCOUNTER — Emergency Department (HOSPITAL_COMMUNITY): Payer: Medicare Other

## 2017-08-10 ENCOUNTER — Encounter (HOSPITAL_COMMUNITY): Payer: Self-pay | Admitting: *Deleted

## 2017-08-10 ENCOUNTER — Emergency Department (HOSPITAL_COMMUNITY)
Admission: EM | Admit: 2017-08-10 | Discharge: 2017-08-11 | Disposition: A | Discharge status: Home / Self Care | Payer: Medicare Other | Attending: Emergency Medicine | Admitting: Emergency Medicine

## 2017-08-10 DIAGNOSIS — R42 Dizziness and giddiness: Secondary | ICD-10-CM | POA: Insufficient documentation

## 2017-08-10 DIAGNOSIS — Z79899 Other long term (current) drug therapy: Secondary | ICD-10-CM | POA: Diagnosis not present

## 2017-08-10 DIAGNOSIS — E213 Hyperparathyroidism, unspecified: Secondary | ICD-10-CM | POA: Insufficient documentation

## 2017-08-10 DIAGNOSIS — I10 Essential (primary) hypertension: Secondary | ICD-10-CM | POA: Diagnosis not present

## 2017-08-10 DIAGNOSIS — R404 Transient alteration of awareness: Secondary | ICD-10-CM | POA: Diagnosis not present

## 2017-08-10 DIAGNOSIS — R110 Nausea: Secondary | ICD-10-CM | POA: Diagnosis not present

## 2017-08-10 DIAGNOSIS — R531 Weakness: Secondary | ICD-10-CM | POA: Diagnosis not present

## 2017-08-10 LAB — COMPREHENSIVE METABOLIC PANEL
ALT: 22 U/L (ref 14–54)
ANION GAP: 10 (ref 5–15)
AST: 21 U/L (ref 15–41)
Albumin: 3.8 g/dL (ref 3.5–5.0)
Alkaline Phosphatase: 71 U/L (ref 38–126)
BILIRUBIN TOTAL: 0.6 mg/dL (ref 0.3–1.2)
BUN: 10 mg/dL (ref 6–20)
CO2: 26 mmol/L (ref 22–32)
Calcium: 10 mg/dL (ref 8.9–10.3)
Chloride: 103 mmol/L (ref 101–111)
Creatinine, Ser: 0.86 mg/dL (ref 0.44–1.00)
Glucose, Bld: 128 mg/dL — ABNORMAL HIGH (ref 65–99)
Potassium: 3.5 mmol/L (ref 3.5–5.1)
Sodium: 139 mmol/L (ref 135–145)
TOTAL PROTEIN: 6.9 g/dL (ref 6.5–8.1)

## 2017-08-10 LAB — CBC WITH DIFFERENTIAL/PLATELET
Basophils Absolute: 0 10*3/uL (ref 0.0–0.1)
Basophils Relative: 0 %
EOS PCT: 1 %
Eosinophils Absolute: 0.1 10*3/uL (ref 0.0–0.7)
HEMATOCRIT: 39.4 % (ref 36.0–46.0)
Hemoglobin: 13.2 g/dL (ref 12.0–15.0)
LYMPHS PCT: 27 %
Lymphs Abs: 2.8 10*3/uL (ref 0.7–4.0)
MCH: 28.9 pg (ref 26.0–34.0)
MCHC: 33.5 g/dL (ref 30.0–36.0)
MCV: 86.4 fL (ref 78.0–100.0)
MONO ABS: 0.5 10*3/uL (ref 0.1–1.0)
MONOS PCT: 5 %
Neutro Abs: 7 10*3/uL (ref 1.7–7.7)
Neutrophils Relative %: 67 %
Platelets: 167 10*3/uL (ref 150–400)
RBC: 4.56 MIL/uL (ref 3.87–5.11)
RDW: 13.9 % (ref 11.5–15.5)
WBC: 10.4 10*3/uL (ref 4.0–10.5)

## 2017-08-10 LAB — CBG MONITORING, ED: GLUCOSE-CAPILLARY: 119 mg/dL — AB (ref 65–99)

## 2017-08-10 LAB — I-STAT BETA HCG BLOOD, ED (MC, WL, AP ONLY): I-stat hCG, quantitative: <5 m[IU]/mL (ref <5)

## 2017-08-10 MED ORDER — MECLIZINE HCL 12.5 MG PO TABS: 12.5000 mg | ORAL_TABLET | Freq: Three times a day (TID) | ORAL | 0 refills | Status: DC | PRN

## 2017-08-10 MED ORDER — LORAZEPAM 2 MG/ML IJ SOLN
0.5000 mg | Freq: Once | INTRAMUSCULAR | Status: AC
  Administered 2017-08-10: 0.5 mg via INTRAVENOUS
  Filled 2017-08-10: qty 1

## 2017-08-10 NOTE — ED Triage Notes (Signed)
Pt in c/o dizziness onset today with waking @6am , symptoms worse with movement, pt denies slurred speech, denies visual changes, no facial droop, A&O x4

## 2017-08-10 NOTE — ED Notes (Signed)
Pt's CBG result was 119. Informed Abigail Butts - RN.

## 2017-08-10 NOTE — ED Provider Notes (Signed)
[facility] EMERGENCY DEPARTMENT Provider Note   CSN: [id number] Arrival date & time: 08/10/17  1820     History   Chief Complaint Chief Complaint  Patient presents with  . Dizziness    HPI Rachael Zimmerman is a 57 y.o. female.  HPI 57 year old female presents today complaining of dizziness that was present this morning at approximately 6 AM when she got up.  When she moves her head around she feels like things were moving and she has had some nausea.  She has a history of a benign brain tumor which was resected previously at [facility].  She reports that since that time she has had some right-sided weakness.  She does not feel there is any change in her weakness today.  Denies any visual changes, difficulty speaking or facial asymmetry.  She denies any headache, fever, or neck pain. Past Medical History:  Diagnosis Date  . 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 Hypertension   . Pre-diabetes    patient denies ; see lab result for a1c 03-06-17 is 6.0  . Seizures ([place])    AFTER BRAIN SURGERY; none recently   . Weakness of right side of body    d/t brain tumor intervention     Patient Active Problem List   Diagnosis Date Noted  . Hyperparathyroidism, primary ([place]) 03/11/2017  . 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 History of ankle fracture 11/04/2016  . Health care maintenance 08/22/2015  . Hyperglycemia 08/10/2015  . Hot flashes 02/14/2015  . Skin lesion of left arm 02/14/2015  . Gait disturbance 07/11/2014  . Right knee pain 12/29/2013  . Essential hypertension, benign 02/08/2013  . History of seizure disorder 09/28/2012  . History of brain tumor 09/11/2012    Past Surgical History:  Procedure Laterality Date  . ABDOMINAL HYSTERECTOMY  2010   ovaries not removed  . BRAIN SURGERY     tumor excision s/p craniectomy  . BREAST BIOPSY Left 2010   benign  . CATARACT EXTRACTION W/PHACO Left 11/07/2016   Procedure: CATARACT  EXTRACTION PHACO AND INTRAOCULAR LENS PLACEMENT (IOC);  Surgeon: Estill Cotta, MD;  Location: [facility] ORS;  Service: Ophthalmology;  Laterality: Left;  [place] 00:53.9 AP% 16.7 CDE 17.02 Fluid Pack Lot # [id number]  . PARATHYROIDECTOMY Right 03/14/2017   Procedure: RIGHT PARATHYROIDECTOMY;  Surgeon: Armandina Gemma, MD;  Location: [facility];  Service: General;  Laterality: Right;    OB History    No data available       Home Medications    Prior to Admission medications   Medication Sig Start Date End Date Taking? Authorizing Provider  hydrochlorothiazide (MICROZIDE) 12.5 MG capsule TAKE 1 CAPSULE (12.5 MG TOTAL) BY MOUTH DAILY. 07/19/17   Einar Pheasant, MD  influenza vac recom quadrivalent (FLUBLOK) 0.5 ML injection Inject 0.5 mLs into the muscle once.    [provider]    Family History Family History  Problem Relation Age of Onset  . Cancer Mother        ovary  . Breast cancer Neg Hx   . Colon cancer Neg Hx     Social History Social History   Tobacco Use  . Smoking status: Never Smoker  . Smokeless tobacco: Never Used  Substance Use Topics  . Alcohol use: Yes    Alcohol/week: 0.0 oz    Comment: seldom   . Drug use: No     Allergies   Penicillins   Review of Systems Review  of Systems  All other systems reviewed and are negative.    Physical Exam Updated Vital Signs BP 123/61   Pulse 75   Temp 97.7 F (36.5 C) (Oral)   Resp 20   SpO2 100%   Physical Exam  Constitutional: She is oriented to person, place, and time. She appears well-developed and well-nourished. No distress.  HENT:  Head: Normocephalic and atraumatic.  Right Ear: External ear normal.  Left Ear: External ear normal.  Nose: Nose normal.  Mouth/Throat: Oropharynx is clear and moist.  Eyes: Conjunctivae and EOM are normal. Pupils are equal, round, and reactive to light.  Neck: Normal range of motion. Neck supple.  Cardiovascular: Normal rate, regular rhythm, normal heart sounds and  intact distal pulses.  Pulmonary/Chest: Effort normal and breath sounds normal.  Abdominal: Soft. Bowel sounds are normal.  Musculoskeletal: Normal range of motion. She exhibits no edema.  Neurological: She is alert and oriented to person, place, and time. She displays normal reflexes. No cranial nerve deficit or sensory deficit. She exhibits normal muscle tone. Coordination abnormal.  Patient has right sided palmar drift She has decreased strength in her right lower extremity compared to the left lower extremity, she is only able to hold the right leg up for 3 counts.  Left leg is normal.  Right heel to shin is normal with the left being grossly ataxic  Skin: Skin is warm and dry. Capillary refill takes less than 2 seconds.  Psychiatric: She has a normal mood and affect.  Nursing note and vitals reviewed.    ED Treatments / Results  Labs (all labs ordered are listed, but only abnormal results are displayed) Labs Reviewed  COMPREHENSIVE METABOLIC PANEL - Abnormal; Notable for the following components:      Result Value   Glucose, Bld 128 (*)    All other components within normal limits  CBG MONITORING, ED - Abnormal; Notable for the following components:   Glucose-Capillary 119 (*)    All other components within normal limits  CBC WITH DIFFERENTIAL/PLATELET  URINALYSIS, ROUTINE W REFLEX MICROSCOPIC  I-STAT BETA HCG BLOOD, ED ([facility], [facility], [facility] ONLY)    EKG  EKG Interpretation None       Radiology Ct Head Wo Contrast  Result Date: 08/10/2017 CLINICAL DATA:  Dizziness.  Mass resection in 2005. EXAM: CT HEAD WITHOUT CONTRAST TECHNIQUE: Contiguous axial images were obtained from the base of the skull through the vertex without intravenous contrast. COMPARISON:  CT scan July 04, 2011 FINDINGS: Brain: No subdural, epidural, or subarachnoid hemorrhage. Left frontoparietal encephalomalacia is stable. No mass effect or midline shift. Ventricles and sulci are unremarkable. Cerebellum,  brainstem, and basal cisterns are normal. No acute cortical ischemia or infarct. Vascular: No hyperdense vessel or unexpected calcification. Skull: Previous craniotomy. Sinuses/Orbits: No acute finding. Other: None. 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 No acute abnormality identified. Electronically Signed   By: Dorise Bullion III M.D   On: 08/10/2017 19:50    Procedures Procedures (including critical care time)  Medications Ordered in ED Medications - No data to display   Initial Impression / Assessment and Plan / ED Course  I have reviewed the triage vital signs and the nursing notes.  Pertinent labs & imaging results that were available during my care of the patient were reviewed by me and considered in my medical decision making (see chart for details).     57 yo female ho benign brain tumor s/p resection with residual right sided weakness presents  today with vertigo.  Symptoms increase with head movement. Denies change in weakness.  MRI pending.  Patient should be able to be discharged if no acute abnormality on mri.  Discussed with Dr. Leonides Schanz and she will follow up mri make final disposition Final Clinical Impressions(s) / ED Diagnoses   Final diagnoses:  Dizziness  Vertigo    ED Discharge Orders    None       Pattricia Boss, MD 08/10/17 2321

## 2017-08-10 NOTE — ED Notes (Signed)
Patient's family up to desk, expressing frustrations on delay for patient to be roomed.  Asking for patient to be provided a bed.  This RN explained she had been roomed and they would be coming to get her shortly.

## 2017-08-10 NOTE — ED Notes (Signed)
Patient transported to MRI 

## 2017-08-11 DIAGNOSIS — R42 Dizziness and giddiness: Secondary | ICD-10-CM | POA: Diagnosis not present

## 2017-08-11 MED ORDER — ONDANSETRON 4 MG PO TBDP: 4.0000 mg | ORAL_TABLET | Freq: Four times a day (QID) | ORAL | 0 refills | Status: DC | PRN

## 2017-08-11 MED ORDER — GADOBENATE DIMEGLUMINE 529 MG/ML IV SOLN
16.0000 mL | Freq: Once | INTRAVENOUS | Status: AC | PRN
  Administered 2017-08-11: 16 mL via INTRAVENOUS

## 2017-08-11 NOTE — ED Provider Notes (Signed)
12:00 AM  Assumed care from Dr. Jeanell Sparrow.  Patient is a 57 year old female with history of benign brain tumor that was resected at [facility] approximately 10 years ago who presents to the day with vertigo.  She has residual right-sided weakness on exam which is her baseline.  Labs unremarkable.  CT head unremarkable.  Plan is to obtain MRI of her brain with and without contrast.   1:40 AM  Pt's MRI shows no acute abnormality.  Patient now asymptomatic.  Likely peripheral vertigo.  Will discharge home.  She has a PCP for follow-up.  Given prescription for meclizine.   At this time, I do not feel there is any life-threatening condition present. I have reviewed and discussed all results (EKG, imaging, lab, urine as appropriate) and exam findings with patient/family. I have reviewed nursing notes and appropriate previous records.  I feel the patient is safe to be discharged home without further emergent workup and can continue workup as an outpatient as needed. Discussed usual and customary return precautions. Patient/family verbalize understanding and are comfortable with this plan.  Outpatient follow-up has been provided if needed. All questions have been answered.    Beckhem Isadore, Delice Bison, DO 08/11/17 [phone]

## 2017-10-11 ENCOUNTER — Ambulatory Visit: Payer: Medicare Other | Admitting: Internal Medicine

## 2017-10-15 DIAGNOSIS — E210 Primary hyperparathyroidism: Secondary | ICD-10-CM | POA: Diagnosis not present

## 2017-11-05 DIAGNOSIS — E210 Primary hyperparathyroidism: Secondary | ICD-10-CM | POA: Diagnosis not present

## 2017-11-12 ENCOUNTER — Other Ambulatory Visit: Payer: Self-pay | Admitting: Internal Medicine

## 2018-01-20 ENCOUNTER — Encounter: Payer: Self-pay | Admitting: Internal Medicine

## 2018-01-20 ENCOUNTER — Ambulatory Visit: Payer: Medicare Other | Admitting: Internal Medicine

## 2018-01-20 ENCOUNTER — Encounter

## 2018-01-20 VITALS — BP 126/76 | HR 93 | Temp 98.3°F | Resp 18 | Wt 178.6 lb

## 2018-01-20 DIAGNOSIS — E210 Primary hyperparathyroidism: Secondary | ICD-10-CM | POA: Diagnosis not present

## 2018-01-20 DIAGNOSIS — Z8669 Personal history of other diseases of the nervous system and sense organs: Secondary | ICD-10-CM

## 2018-01-20 DIAGNOSIS — E2839 Other primary ovarian failure: Secondary | ICD-10-CM

## 2018-01-20 DIAGNOSIS — I10 Essential (primary) hypertension: Secondary | ICD-10-CM | POA: Diagnosis not present

## 2018-01-20 DIAGNOSIS — Z87898 Personal history of other specified conditions: Secondary | ICD-10-CM | POA: Diagnosis not present

## 2018-01-20 DIAGNOSIS — R739 Hyperglycemia, unspecified: Secondary | ICD-10-CM

## 2018-01-20 DIAGNOSIS — Z1211 Encounter for screening for malignant neoplasm of colon: Secondary | ICD-10-CM

## 2018-01-20 NOTE — Progress Notes (Signed)
Patient ID: Rachael Zimmerman, female   DOB: 1960-07-10, 57 y.o.   MRN: [id number]   Subjective:    Patient ID: Rachael Zimmerman, female    DOB: 02-Dec-1960, 57 y.o.   MRN: [id number]  HPI  Patient here for a scheduled follow up.  She is s/p parathyroidectomy 03/14/17.  Pathology results - benign.  She reports she is doing well.  Has lost weight.  Feels good.  No chest pain.  No sob.  No acid reflux.  No abdominal pain.  Bowels moving.  Was seen in the ER 08/10/17 for dizziness.  W/up unrevealing.  She described the dizziness as room spinning.  Has not recurred.  No headache or other associated symptoms.  Discussed further w/up.  She declines.  Wants to monitor.     Past Medical History:  Diagnosis Date  . 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[facility])    s/p craniectomy  . Hypertension   . Pre-diabetes    patient denies ; see lab result for a1c 03-06-17 is 6.0  . Seizures ([place])    AFTER BRAIN SURGERY; none recently   . Weakness of right side of body    d/t brain tumor intervention    Past Surgical History:  Procedure Laterality Date  . ABDOMINAL HYSTERECTOMY  2010   ovaries not removed  . BRAIN SURGERY     tumor excision s/p craniectomy  . BREAST BIOPSY Left 2010   benign  . CATARACT EXTRACTION W/PHACO Left 11/07/2016   Procedure: CATARACT EXTRACTION PHACO AND INTRAOCULAR LENS PLACEMENT (IOC);  Surgeon: Estill Cotta, MD;  Location: [facility] ORS;  Service: Ophthalmology;  Laterality: Left;  [place] 00:53.9 AP% 16.7 CDE 17.02 Fluid Pack Lot # [id number]  . PARATHYROIDECTOMY Right 03/14/2017   Procedure: RIGHT PARATHYROIDECTOMY;  Surgeon: Armandina Gemma, MD;  Location: [facility];  Service: General;  Laterality: Right;   Family History  Problem Relation Age of Onset  . Cancer Mother        ovary  . Breast cancer Neg Hx   . Colon cancer Neg Hx    Social History   Socioeconomic History  . Marital status: Legally Separated    Spouse name: Not on file  . Number of children: 1  . Years of education: Not on file  .  Highest education level: Not on file  Occupational History    Employer: [employer]  Social Needs  . Financial resource strain: Not on file  . Food insecurity:    Worry: Not on file    Inability: Not on file  . Transportation needs:    Medical: Not on file    Non-medical: Not on file  Tobacco Use  . Smoking status: Never Smoker  . Smokeless tobacco: Never Used  Substance and Sexual Activity  . Alcohol use: Yes    Alcohol/week: 0.0 standard drinks    Comment: seldom   . Drug use: No  . Sexual activity: Not on file  Lifestyle  . Physical activity:    Days per week: Not on file    Minutes per session: Not on file  . Stress: Not on file  Relationships  . Social connections:    Talks on phone: Not on file    Gets together: Not on file    Attends religious service: Not on file    Active member of club or organization: Not on file    Attends meetings of clubs or organizations: Not on file    Relationship status: Not on file  Other Topics Concern  . Not on file  Social History Narrative  . Not on file    Outpatient Encounter Medications as of 01/20/2018  Medication Sig  . hydrochlorothiazide (MICROZIDE) 12.5 MG capsule TAKE 1 CAPSULE BY MOUTH EVERY DAY  . [DISCONTINUED] influenza vac recom quadrivalent (FLUBLOK) 0.5 ML injection Inject 0.5 mLs into the muscle once.  . [DISCONTINUED] meclizine (ANTIVERT) 12.5 MG tablet Take 1 tablet (12.5 mg total) by mouth 3 (three) times daily as needed for dizziness.  . [DISCONTINUED] ondansetron (ZOFRAN ODT) 4 MG disintegrating tablet Take 1 tablet (4 mg total) by mouth every 6 (six) hours as needed.   No facility-administered encounter medications on file as of 01/20/2018.     Review of Systems  Constitutional: Negative for appetite change and unexpected weight change.  HENT: Negative for congestion and sinus pressure.   Respiratory: Negative for cough, chest tightness and shortness of breath.   Cardiovascular:  Negative for chest pain, palpitations and leg swelling.  Gastrointestinal: Negative for abdominal pain, diarrhea, nausea and vomiting.  Genitourinary: Negative for difficulty urinating and dysuria.  Musculoskeletal: Negative for joint swelling and myalgias.  Skin: Negative for color change and rash.  Neurological: Negative for headaches.       No dizziness now.    Psychiatric/Behavioral: Negative for agitation and dysphoric mood.       Objective:     Blood pressure rechecked by me:  130/78  Physical Exam  Constitutional: She appears well-developed and well-nourished. No distress.  HENT:  Nose: Nose normal.  Mouth/Throat: Oropharynx is clear and moist.  Neck: Neck supple. No thyromegaly present.  Cardiovascular: Normal rate and regular rhythm.  Pulmonary/Chest: Breath sounds normal. No respiratory distress. She has no wheezes.  Abdominal: Soft. Bowel sounds are normal. There is no tenderness.  Musculoskeletal: She exhibits no edema or tenderness.  Lymphadenopathy:    She has no cervical adenopathy.  Skin: No rash noted. No erythema.  Psychiatric: Her behavior is normal.    BP 126/76 (BP Location: Left Arm, Patient Position: Sitting, Cuff Size: Normal)   Pulse 93   Temp 98.3 F (36.8 C) (Oral)   Resp 18   Wt 178 lb 9.6 oz (81 kg)   SpO2 98%   BMI 31.64 kg/m  Wt Readings from Last 3 Encounters:  01/20/18 178 lb 9.6 oz (81 kg)  06/13/17 185 lb 3.2 oz (84 kg)  03/14/17 184 lb (83.5 kg)     Lab Results  Component Value Date   WBC 10.4 08/10/2017   HGB 13.2 08/10/2017   HCT 39.4 08/10/2017   PLT 167 08/10/2017   GLUCOSE 128 (H) 08/10/2017   CHOL 177 06/13/2017   TRIG 87.0 06/13/2017   HDL 54.30 06/13/2017   LDLCALC 105 (H) 06/13/2017   ALT 22 08/10/2017   AST 21 08/10/2017   NA 139 08/10/2017   K 3.5 08/10/2017   CL 103 08/10/2017   CREATININE 0.86 08/10/2017   BUN 10 08/10/2017   CO2 26 08/10/2017   TSH 1.47 11/01/2016   HGBA1C 6.3 06/13/2017    Ct Head  Wo Contrast  Result Date: 08/10/2017 CLINICAL DATA:  Dizziness.  Mass resection in 2005. EXAM: CT HEAD WITHOUT CONTRAST TECHNIQUE: Contiguous axial images were obtained from the base of the skull through the vertex without intravenous contrast. COMPARISON:  CT scan July 04, 2011 FINDINGS: Brain: No subdural, epidural, or subarachnoid hemorrhage. Left frontoparietal encephalomalacia is stable. No mass effect or midline shift. Ventricles and sulci are unremarkable. Cerebellum, brainstem,  and basal cisterns are normal. No acute cortical ischemia or infarct. Vascular: No hyperdense vessel or unexpected calcification. Skull: Previous craniotomy. Sinuses/Orbits: No acute finding. Other: None. 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 No acute abnormality identified. Electronically Signed   By: Dorise Bullion III M.D   On: 08/10/2017 19:50   Mr Brain W And Wo Contrast  Result Date: 08/11/2017 CLINICAL DATA:  57 y/o F; new onset dizziness. History of brain tumor resection. EXAM: MRI HEAD WITHOUT AND WITH CONTRAST TECHNIQUE: Multiplanar, multiecho pulse sequences of the brain and surrounding structures were obtained without and with intravenous contrast. CONTRAST:  98m MULTIHANCE GADOBENATE DIMEGLUMINE 529 MG/ML IV SOLN COMPARISON:  08/10/2017 CT head.  07/16/2011 MRI head. FINDINGS: Brain: No acute infarction, hemorrhage, hydrocephalus, extra-axial collection or mass lesion. Stable left parietal region hemosiderin stained encephalomalacia with ex vacuo dilatation of posterior left lateral ventricle. Single punctate focus of susceptibility hypointensity in left temporal lobe compatible with hemosiderin deposition of chronic microhemorrhage. Vascular: Normal flow voids. Skull and upper cervical spine: Chronic postsurgical changes related to left parietal craniotomy. Sinuses/Orbits: Right maxillary sinus mucous retention cyst. Left globe intra-ocular lens replacement. Otherwise negative.  Other: None. IMPRESSION: 1. No acute intracranial abnormality identified. 2. Stable left parietal region resection cavity, encephalomalacia, and chronic craniotomy postsurgical changes. Electronically Signed   By: LKristine GarbeM.D.   On: 08/11/2017 00:44       Assessment & Plan:   Problem List Items Addressed This Visit    Colon cancer screening    Declines colonoscopy.  Agreed to cologuard.        Essential hypertension, benign    Blood pressure under good control.  Continue same medication regimen.  Follow pressures.  Follow metabolic panel.        Relevant Orders   Hepatic function panel   Lipid panel   Basic metabolic panel   History of brain tumor    S/p excision and craniectomy.  Has been evaluated by Dr SManuella Ghazi       History of seizure disorder    Doing well off keppra.        Hyperglycemia    Low carb diet and exercise.  Follow met b and a1c.       Relevant Orders   Hemoglobin A1c   Hyperparathyroidism, primary ([place] - Primary    S/p parathyroidectomy.  Followed by Dr GHarlow Asa  Follow calcium.  Discussed the need for bone density.  She was agreeable.        Relevant Orders   DG Bone Density   TSH    Other Visit Diagnoses    Estrogen deficiency       Relevant Orders   DG Bone Density       CEinar Pheasant MD

## 2018-01-25 ENCOUNTER — Encounter: Payer: Self-pay | Admitting: Internal Medicine

## 2018-01-25 DIAGNOSIS — Z1211 Encounter for screening for malignant neoplasm of colon: Secondary | ICD-10-CM | POA: Insufficient documentation

## 2018-01-25 NOTE — Assessment & Plan Note (Addendum)
S/p parathyroidectomy.  Followed by Dr Harlow Asa.  Follow calcium.  Discussed the need for bone density.  She was agreeable.

## 2018-01-25 NOTE — Assessment & Plan Note (Signed)
S/p excision and craniectomy.  Has been evaluated by Dr Manuella Ghazi.

## 2018-01-25 NOTE — Assessment & Plan Note (Signed)
Declines colonoscopy.  Agreed to cologuard.

## 2018-01-25 NOTE — Assessment & Plan Note (Signed)
Blood pressure under good control.  Continue same medication regimen.  Follow pressures.  Follow metabolic panel.   

## 2018-01-25 NOTE — Assessment & Plan Note (Signed)
Low carb diet and exercise.  Follow met b and a1c.  

## 2018-01-25 NOTE — Assessment & Plan Note (Signed)
Doing well off keppra.

## 2018-02-13 ENCOUNTER — Other Ambulatory Visit (INDEPENDENT_AMBULATORY_CARE_PROVIDER_SITE_OTHER): Payer: Medicare Other

## 2018-02-13 ENCOUNTER — Ambulatory Visit
Admission: RE | Admit: 2018-02-13 | Discharge: 2018-02-13 | Disposition: A | Discharge status: Home / Self Care | Payer: Medicare Other | Source: Ambulatory Visit | Attending: Internal Medicine | Admitting: Internal Medicine

## 2018-02-13 DIAGNOSIS — E210 Primary hyperparathyroidism: Secondary | ICD-10-CM

## 2018-02-13 DIAGNOSIS — E2839 Other primary ovarian failure: Secondary | ICD-10-CM

## 2018-02-13 DIAGNOSIS — Z13820 Encounter for screening for osteoporosis: Secondary | ICD-10-CM | POA: Diagnosis not present

## 2018-02-13 DIAGNOSIS — I10 Essential (primary) hypertension: Secondary | ICD-10-CM | POA: Diagnosis not present

## 2018-02-13 DIAGNOSIS — R739 Hyperglycemia, unspecified: Secondary | ICD-10-CM

## 2018-02-13 DIAGNOSIS — Z780 Asymptomatic menopausal state: Secondary | ICD-10-CM | POA: Diagnosis not present

## 2018-02-13 LAB — LIPID PANEL
CHOL/HDL RATIO: 3
CHOLESTEROL: 165 mg/dL (ref 0–200)
HDL: 50.4 mg/dL (ref >39.00)
LDL CALC: 103 mg/dL — AB (ref 0–99)
NONHDL: 114.53
Triglycerides: 59 mg/dL (ref 0.0–149.0)
VLDL: 11.8 mg/dL (ref 0.0–40.0)

## 2018-02-13 LAB — BASIC METABOLIC PANEL
BUN: 15 mg/dL (ref 6–23)
CALCIUM: 9.9 mg/dL (ref 8.4–10.5)
CHLORIDE: 104 meq/L (ref 96–112)
CO2: 30 meq/L (ref 19–32)
CREATININE: 0.98 mg/dL (ref 0.40–1.20)
GFR: 75.15 mL/min (ref >60.00)
Glucose, Bld: 89 mg/dL (ref 70–99)
Potassium: 3.6 mEq/L (ref 3.5–5.1)
Sodium: 139 mEq/L (ref 135–145)

## 2018-02-13 LAB — HEPATIC FUNCTION PANEL
ALT: 18 U/L (ref 0–35)
AST: 17 U/L (ref 0–37)
Albumin: 4.1 g/dL (ref 3.5–5.2)
Alkaline Phosphatase: 68 U/L (ref 39–117)
BILIRUBIN DIRECT: 0.1 mg/dL (ref 0.0–0.3)
BILIRUBIN TOTAL: 0.4 mg/dL (ref 0.2–1.2)
Total Protein: 6.9 g/dL (ref 6.0–8.3)

## 2018-02-13 LAB — TSH: TSH: 1.93 u[IU]/mL (ref 0.35–4.50)

## 2018-02-13 LAB — HEMOGLOBIN A1C: HEMOGLOBIN A1C: 6.1 % (ref 4.6–6.5)

## 2018-02-18 ENCOUNTER — Other Ambulatory Visit: Payer: Self-pay | Admitting: Internal Medicine

## 2018-05-07 ENCOUNTER — Encounter: Payer: Self-pay | Admitting: Internal Medicine

## 2018-05-07 ENCOUNTER — Ambulatory Visit (INDEPENDENT_AMBULATORY_CARE_PROVIDER_SITE_OTHER): Payer: Medicare Other | Admitting: Internal Medicine

## 2018-05-07 DIAGNOSIS — Z1211 Encounter for screening for malignant neoplasm of colon: Secondary | ICD-10-CM

## 2018-05-07 DIAGNOSIS — I10 Essential (primary) hypertension: Secondary | ICD-10-CM | POA: Diagnosis not present

## 2018-05-07 DIAGNOSIS — Z87898 Personal history of other specified conditions: Secondary | ICD-10-CM | POA: Diagnosis not present

## 2018-05-07 DIAGNOSIS — Z23 Encounter for immunization: Secondary | ICD-10-CM | POA: Diagnosis not present

## 2018-05-07 DIAGNOSIS — E210 Primary hyperparathyroidism: Secondary | ICD-10-CM

## 2018-05-07 DIAGNOSIS — R739 Hyperglycemia, unspecified: Secondary | ICD-10-CM

## 2018-05-07 DIAGNOSIS — Z8669 Personal history of other diseases of the nervous system and sense organs: Secondary | ICD-10-CM | POA: Diagnosis not present

## 2018-05-07 NOTE — Progress Notes (Signed)
Patient ID: Rachael Zimmerman, female   DOB: 1960/10/30, 57 y.o.   MRN: [id number]   Subjective:    Patient ID: Rachael Zimmerman, female    DOB: January 30, 1961, 57 y.o.   MRN: [id number]  HPI  Patient here for a scheduled follow up.  Was scheduled for a physical.  Just had her physical through gyn.  PAP 07/19/17 - negative with negative HPV.  She has adjusted her diet.  Lost weight.  Feels better.  No chest pain.  Breathing stable.  No sob.  No acid reflux.  No abdominal pain. Bowels moving.  No urine change.  Had normal bone density 02/2018.  Just returned her cologuard this week.  Results pending.  Has f/u with Dr Rachael Zimmerman in 06/2018.     Past Medical History:  Diagnosis Date  . 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[place])    s/p craniectomy  . Hypertension   . Pre-diabetes    patient denies ; see lab result for a1c 03-06-17 is 6.0  . Seizures ([place])    AFTER BRAIN SURGERY; none recently   . Weakness of right side of body    d/t brain tumor intervention    Past Surgical History:  Procedure Laterality Date  . ABDOMINAL HYSTERECTOMY  2010   ovaries not removed  . BRAIN SURGERY     tumor excision s/p craniectomy  . BREAST BIOPSY Left 2010   benign  . CATARACT EXTRACTION W/PHACO Left 11/07/2016   Procedure: CATARACT EXTRACTION PHACO AND INTRAOCULAR LENS PLACEMENT (IOC);  Surgeon: Rachael Cotta, MD;  Location: [facility] ORS;  Service: Ophthalmology;  Laterality: Left;  [place] 00:53.9 AP% 16.7 CDE 17.02 Fluid Pack Lot # [id number]  . PARATHYROIDECTOMY Right 03/14/2017   Procedure: RIGHT PARATHYROIDECTOMY;  Surgeon: Rachael Gemma, MD;  Location: [facility];  Service: General;  Laterality: Right;   Family History  Problem Relation Age of Onset  . Cancer Mother        ovary  . Breast cancer Neg Hx   . Colon cancer Neg Hx    Social History   Socioeconomic History  . Marital status: Legally Separated    Spouse name: Not on file  . Number of children: 1  . Years of education: Not on file  . Highest education level: Not on  file  Occupational History    Employer: [employer]  Social Needs  . Financial resource strain: Not on file  . Food insecurity:    Worry: Not on file    Inability: Not on file  . Transportation needs:    Medical: Not on file    Non-medical: Not on file  Tobacco Use  . Smoking status: Never Smoker  . Smokeless tobacco: Never Used  Substance and Sexual Activity  . Alcohol use: Yes    Alcohol/week: 0.0 standard drinks    Comment: seldom   . Drug use: No  . Sexual activity: Not on file  Lifestyle  . Physical activity:    Days per week: Not on file    Minutes per session: Not on file  . Stress: Not on file  Relationships  . Social connections:    Talks on phone: Not on file    Gets together: Not on file    Attends religious service: Not on file    Active member of club or organization: Not on file    Attends meetings of clubs or organizations: Not on file    Relationship status: Not on file  Other Topics Concern  .  Not on file  Social History Narrative  . Not on file    Outpatient Encounter Medications as of 05/07/2018  Medication Sig  . hydrochlorothiazide (MICROZIDE) 12.5 MG capsule TAKE 1 CAPSULE BY MOUTH EVERY DAY   No facility-administered encounter medications on file as of 05/07/2018.     Review of Systems  Constitutional: Negative for appetite change and unexpected weight change.  HENT: Negative for congestion and sinus pressure.   Respiratory: Negative for cough, chest tightness and shortness of breath.   Cardiovascular: Negative for chest pain, palpitations and leg swelling.  Gastrointestinal: Negative for abdominal pain, diarrhea, nausea and vomiting.  Genitourinary: Negative for difficulty urinating and dysuria.  Musculoskeletal: Negative for joint swelling and myalgias.  Skin: Negative for color change and rash.  Neurological: Negative for dizziness, light-headedness and headaches.  Psychiatric/Behavioral: Negative for agitation and  dysphoric mood.       Objective:    Physical Exam  Constitutional: She appears well-developed and well-nourished. No distress.  HENT:  Nose: Nose normal.  Mouth/Throat: Oropharynx is clear and moist.  Neck: Neck supple. No thyromegaly present.  Cardiovascular: Normal rate and regular rhythm.  Pulmonary/Chest: Breath sounds normal. No respiratory distress. She has no wheezes.  Abdominal: Soft. Bowel sounds are normal. There is no tenderness.  Musculoskeletal: She exhibits no edema or tenderness.  Lymphadenopathy:    She has no cervical adenopathy.  Skin: No rash noted. No erythema.  Psychiatric: She has a normal mood and affect. Her behavior is normal.    BP 118/78 (BP Location: Left Arm, Patient Position: Sitting, Cuff Size: Normal)   Pulse 90   Temp 98.1 F (36.7 C) (Oral)   Resp 16   Ht _0  (1.575 m)   Wt 170 lb 9.6 oz (77.4 kg)   SpO2 97%   BMI 31.20 kg/m  Wt Readings from Last 3 Encounters:  05/07/18 170 lb 9.6 oz (77.4 kg)  01/20/18 178 lb 9.6 oz (81 kg)  06/13/17 185 lb 3.2 oz (84 kg)     Lab Results  Component Value Date   WBC 10.4 08/10/2017   HGB 13.2 08/10/2017   HCT 39.4 08/10/2017   PLT 167 08/10/2017   GLUCOSE 89 02/13/2018   CHOL 165 02/13/2018   TRIG 59.0 02/13/2018   HDL 50.40 02/13/2018   LDLCALC 103 (H) 02/13/2018   ALT 18 02/13/2018   AST 17 02/13/2018   NA 139 02/13/2018   K 3.6 02/13/2018   CL 104 02/13/2018   CREATININE 0.98 02/13/2018   BUN 15 02/13/2018   CO2 30 02/13/2018   TSH 1.93 02/13/2018   HGBA1C 6.1 02/13/2018    Dg Bone Density  Result Date: 02/13/2018 EXAM: DUAL X-RAY ABSORPTIOMETRY (DXA) FOR BONE MINERAL DENSITY IMPRESSION: Dear Dr. Nicki Zimmerman, Your patient Rachael Zimmerman completed a BMD test on 02/13/2018 using the [place] (analysis version: 14.10) manufactured by [employer]. The following summarizes the results of our evaluation. PATIENT BIOGRAPHICAL: Name: Rachael, Zimmerman Patient ID: [id number] Birth Date:  [birth year] Height: 62.5 in. Gender: Female Exam Date: 02/13/2018 Weight: 171.5 lbs. Indications: Hysterectomy, Postmenopausal Fractures: Treatments: Vitamin D ASSESSMENT: The BMD measured at Femur Neck Left is 1.014 g/cm2 with a T-score of -0.2. This patient is considered normal according to [facility]) criteria. Site Region Measured Measured WHO Young Adult BMD Date       Age      Classification T-score AP Spine L1-L4 2/13/[age] 57.3 Normal 1.7 1.402 g/cm2 DualFemur Neck Left 02/13/2018 57.3  Normal -0.2 1.014 g/cm2 World Health Organization [facility]) criteria for post-menopausal, Caucasian Women: Normal:       T-score at or above -1 SD Osteopenia:   T-score between -1 and -2.5 SD Osteoporosis: T-score at or below -2.5 SD RECOMMENDATIONS: 1. All patients should optimize calcium and vitamin D intake. 2. Consider FDA-approved medical therapies in postmenopausal women and men aged 22 years and older, based on the following: a. A hip or vertebral(clinical or morphometric) fracture b. T-score < -2.5 at the femoral neck or spine after appropriate evaluation to exclude secondary causes c. Low bone mass (T-score between -1.0 and -2.5 at the femoral neck or spine) and a 10-year probability of a hip fracture > 3% or a 10-year probability of a major osteoporosis-related fracture > 20% based on the [place]-adapted WHO algorithm d. Clinician judgment and/or patient preferences may indicate treatment for people with 10-year fracture probabilities above or below these levels FOLLOW-UP: People with diagnosed cases of osteoporosis or at high risk for fracture should have regular bone mineral density tests. For patients eligible for Medicare, routine testing is allowed once every 2 years. The testing frequency can be increased to one year for patients who have rapidly progressing disease, those who are receiving or discontinuing medical therapy to restore bone mass, or have additional risk factors. I have reviewed this  report, and agree with the above findings. [facility] Electronically Signed   By: Lowella Grip III M.D.   On: 02/13/2018 09:26       Assessment & Plan:   Problem List Items Addressed This Visit    Colon cancer screening    Returned cologuard this week.  Results pending.  Follow.        Essential hypertension, benign    Blood pressure under good control.  Continue same medication regimen.  Follow pressures.  Follow metabolic panel.        Relevant Orders   Hepatic function panel   Lipid panel   Basic metabolic panel   History of brain tumor    S/p excision and craniectomy.  Has been evaluated by Dr Manuella Ghazi.  Follow.        History of seizure disorder    Doing well off keppra.  Follow.       Hyperglycemia    Has adjusted her diet.  Lost weight.  Follow met b and a1c.        Relevant Orders   Hemoglobin A1c   Hyperparathyroidism, primary ([place])    S/p parathyroidectomy.  Followed by Dr Rachael Zimmerman.  Follow calcium.  Normal bone density 02/2018.  Has f/u with Dr Rachael Zimmerman 06/2018.         Other Visit Diagnoses    Need for immunization against influenza       Relevant Orders   Flu Vaccine QUAD 36+ mos IM (Completed)       Einar Pheasant, MD

## 2018-05-10 ENCOUNTER — Encounter: Payer: Self-pay | Admitting: Internal Medicine

## 2018-05-10 NOTE — Assessment & Plan Note (Signed)
S/p excision and craniectomy.  Has been evaluated by Dr Manuella Ghazi.  Follow.

## 2018-05-10 NOTE — Assessment & Plan Note (Signed)
Has adjusted her diet.  Lost weight.  Follow met b and a1c.  

## 2018-05-10 NOTE — Assessment & Plan Note (Signed)
Blood pressure under good control.  Continue same medication regimen.  Follow pressures.  Follow metabolic panel.   

## 2018-05-10 NOTE — Assessment & Plan Note (Signed)
Doing well off keppra.  Follow.

## 2018-05-10 NOTE — Assessment & Plan Note (Signed)
Returned cologuard this week.  Results pending.  Follow.

## 2018-05-10 NOTE — Assessment & Plan Note (Signed)
S/p parathyroidectomy.  Followed by Dr Harlow Asa.  Follow calcium.  Normal bone density 02/2018.  Has f/u with Dr Harlow Asa 06/2018.

## 2018-06-10 ENCOUNTER — Ambulatory Visit: Payer: Self-pay | Admitting: *Deleted

## 2018-06-10 NOTE — Telephone Encounter (Signed)
Summary: PLEASE ADVISE   Pt is having left knee pain and tylenol is not working. Pt would like to know if there is some OTC cream etc she can try to help relief the pain. Pt does have and appt with lauren guse on 06-12-18

## 2018-06-10 NOTE — Telephone Encounter (Signed)
Pt. Reports she was at home around Christmas and heard her left knee "pop". Did not have pain immediately. Has pain on top of the knee and towards the inner aspect. States Tylenol does not help. Will try Ibuprofen, ice and OTC topical agents. Has an appointment 06/12/18. Answer Assessment - Initial Assessment Questions 1. LOCATION and RADIATION: "Where is the pain located?"      Left knee pain 2. QUALITY: "What does the pain feel like?"  (e.g., sharp, dull, aching, burning)     Aching 3. SEVERITY: "How bad is the pain?" "What does it keep you from doing?"   (Scale 1-10; or mild, moderate, severe)   -  MILD (1-3): doesn't interfere with normal activities    -  MODERATE (4-7): interferes with normal activities (e.g., work or school) or awakens from sleep, limping    -  SEVERE (8-10): excruciating pain, unable to do any normal activities, unable to walk     9 4. ONSET: "When did the pain start?" "Does it come and go, or is it there all the time?"     Around Christmas 5. RECURRENT: "Have you had this pain before?" If so, ask: "When, and what happened then?"     No 6. SETTING: "Has there been any recent work, exercise or other activity that involved that part of the body?"      No 7. AGGRAVATING FACTORS: "What makes the knee pain worse?" (e.g., walking, climbing stairs, running)     Walking makes it worse 8. ASSOCIATED SYMPTOMS: "Is there any swelling or redness of the knee?"     No 9. OTHER SYMPTOMS: "Do you have any other symptoms?" (e.g., chest pain, difficulty breathing, fever, calf pain)     No 10. PREGNANCY: "Is there any chance you are pregnant?" "When was your last menstrual period?"       No  Protocols used: KNEE PAIN-A-AH

## 2018-06-12 ENCOUNTER — Encounter: Payer: Self-pay | Admitting: Family Medicine

## 2018-06-12 ENCOUNTER — Ambulatory Visit (INDEPENDENT_AMBULATORY_CARE_PROVIDER_SITE_OTHER): Payer: Medicare Other

## 2018-06-12 ENCOUNTER — Ambulatory Visit: Payer: Medicare Other | Admitting: Family Medicine

## 2018-06-12 VITALS — BP 132/82 | HR 95 | Temp 98.2°F | Ht 62.0 in | Wt 171.2 lb

## 2018-06-12 DIAGNOSIS — M1712 Unilateral primary osteoarthritis, left knee: Secondary | ICD-10-CM | POA: Diagnosis not present

## 2018-06-12 DIAGNOSIS — M25562 Pain in left knee: Secondary | ICD-10-CM

## 2018-06-12 MED ORDER — MELOXICAM 7.5 MG PO TABS: 7.5000 mg | ORAL_TABLET | Freq: Every day | ORAL | 0 refills | Status: DC

## 2018-06-12 NOTE — Patient Instructions (Signed)
No motrin or ibuprofen with mobic  You can take tylenol with mobic if needed

## 2018-06-12 NOTE — Progress Notes (Signed)
Subjective:    Patient ID: Rachael Zimmerman, female    DOB: 12/29/1960, 58 y.o.   MRN: [id number]  HPI  Presents to clinic c/o knee pain for about 2 weeks.  Thinks the pain began after crouching and bending while doing Christmas decorations.  States she did hear a pop upon standing from a crouched position.  Patient has been using ibuprofen 800 mg twice daily for the past 2 to 3 days which has helped somewhat to improve pain.  Patient notices pain mainly when her knee is completely extended.  Denies any numbness or tingling in extremities.  States the knee especially painful and stiff after she has been laying or sitting for a while and first has to get herself standing up.  The knee joint will usually loosen up somewhat as she continues to walk and move.  Patient Active Problem List   Diagnosis Date Noted  . Colon cancer screening 01/25/2018  . Hyperparathyroidism, primary ([place]) 03/11/2017  . 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 History of ankle fracture 11/04/2016  . Health care maintenance 08/22/2015  . Hyperglycemia 08/10/2015  . Hot flashes 02/14/2015  . Skin lesion of left arm 02/14/2015  . Gait disturbance 07/11/2014  . Right knee pain 12/29/2013  . Essential hypertension, benign 02/08/2013  . History of seizure disorder 09/28/2012  . History of brain tumor 09/11/2012   Social History   Tobacco Use  . Smoking status: Never Smoker  . Smokeless tobacco: Never Used  Substance Use Topics  . Alcohol use: Yes    Alcohol/week: 0.0 standard drinks    Comment: seldom     Review of Systems  Constitutional: Negative for chills, fatigue and fever.  HENT: Negative for congestion, ear pain, sinus pain and sore throat.   Eyes: Negative.   Respiratory: Negative for cough, shortness of breath and wheezing.   Cardiovascular: Negative for chest pain, palpitations and leg swelling.  Gastrointestinal: Negative for abdominal pain, diarrhea, nausea and vomiting.  Genitourinary: Negative for  dysuria, frequency and urgency.  Musculoskeletal: +left knee pain Skin: Negative for color change, pallor and rash.  Neurological: Negative for syncope, light-headedness and headaches.  Psychiatric/Behavioral: The patient is not nervous/anxious.    Objective:   Physical Exam Vitals signs and nursing note reviewed.  Constitutional:      General: She is not in acute distress.    Appearance: Normal appearance.  HENT:     Head: Normocephalic and atraumatic.  Eyes:     General: No scleral icterus.    Extraocular Movements: Extraocular movements intact.     Conjunctiva/sclera: Conjunctivae normal.  Cardiovascular:     Rate and Rhythm: Normal rate and regular rhythm.  Pulmonary:     Effort: Pulmonary effort is normal.     Breath sounds: Normal breath sounds.  Musculoskeletal:     Left knee: She exhibits normal range of motion, no swelling, no effusion, no ecchymosis, no deformity, no erythema and normal alignment. Tenderness found. LCL tenderness noted.       Legs:     Comments: Area of tenderness indicated by red mark on diagram. +pain with full extension of knee, tolerated full flexion well. Negative anterior and posterior drawer tests.   Skin:    General: Skin is warm and dry.     Coloration: Skin is not pale.     Findings: No bruising.  Neurological:     Mental Status: She is alert.     Comments: Very slight limp with walking, stride improves after a  few steps.   Psychiatric:        Mood and Affect: Mood normal.        Behavior: Behavior normal.       Vitals:   06/12/18 1140  BP: 132/82  Pulse: 95  Temp: 98.2 F (36.8 C)  SpO2: 96%   Assessment & Plan:   Left knee pain-we will get x-ray of left knee.  Patient will also begin taking Mobic once per day and has been advised to trial using a soft knee brace for extra joint support.  Advised she may use Tylenol as needed on top of mobile, but educated to not take Motrin/ibuprofen with Mobic.  Patient also given referral for  physical therapy.  If pain continues to persist after physical therapy, we can consider MRI imaging.  I suspect pain is related to a sprain/strain of the knee, and I am hopeful the pain will improve with use of Mobic, knee brace and physical therapy.  Patient will follow-up here in approximately 4 to 6 weeks to see if pain is resolving.

## 2018-06-23 DIAGNOSIS — Z9889 Other specified postprocedural states: Secondary | ICD-10-CM | POA: Diagnosis not present

## 2018-06-23 DIAGNOSIS — E210 Primary hyperparathyroidism: Secondary | ICD-10-CM | POA: Diagnosis not present

## 2018-07-05 ENCOUNTER — Other Ambulatory Visit: Payer: Self-pay | Admitting: Family Medicine

## 2018-07-05 DIAGNOSIS — M25562 Pain in left knee: Secondary | ICD-10-CM

## 2018-07-11 ENCOUNTER — Other Ambulatory Visit: Payer: Self-pay | Admitting: Internal Medicine

## 2018-07-11 DIAGNOSIS — Z1231 Encounter for screening mammogram for malignant neoplasm of breast: Secondary | ICD-10-CM

## 2018-07-16 ENCOUNTER — Encounter: Payer: Self-pay | Admitting: Physical Therapy

## 2018-07-16 ENCOUNTER — Ambulatory Visit: Payer: Medicare Other | Attending: Family Medicine | Admitting: Physical Therapy

## 2018-07-16 ENCOUNTER — Other Ambulatory Visit: Payer: Self-pay

## 2018-07-16 DIAGNOSIS — R262 Difficulty in walking, not elsewhere classified: Secondary | ICD-10-CM | POA: Diagnosis not present

## 2018-07-16 DIAGNOSIS — M25662 Stiffness of left knee, not elsewhere classified: Secondary | ICD-10-CM | POA: Diagnosis not present

## 2018-07-16 DIAGNOSIS — M25562 Pain in left knee: Secondary | ICD-10-CM | POA: Insufficient documentation

## 2018-07-16 DIAGNOSIS — M6281 Muscle weakness (generalized): Secondary | ICD-10-CM | POA: Diagnosis not present

## 2018-07-16 NOTE — Therapy (Deleted)
[facility] [address] [place], [address] Phone: [phone]   Fax:  [phone]  Physical Therapy Evaluation  Patient Details  Name: Rachael Zimmerman MRN: [id number] Date of Birth: 1961/01/24 Referring Provider (PT): Jodelle Green, FNP   Encounter Date: 07/16/2018  PT End of Session - 07/17/18 1142    Visit Number  1    Number of Visits  12    Date for PT Re-Evaluation  08/28/18    Authorization Type  [employer] Medicare reporting period from 07/16/2018    Authorization Time Period  Current cert period: 11/17/6293 - 08/28/2018 (latest PN: [facility] 07/16/2018)    Authorization - Visit Number  1    Authorization - Number of Visits  10    PT Start Time  1040    PT Stop Time  1140    PT Time Calculation (min)  60 min    Activity Tolerance  Patient tolerated treatment well    Behavior During Therapy  [facility] for tasks assessed/performed       Past Medical History:  Diagnosis Date  . 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[place])    s/p craniectomy  . Hypertension   . Pre-diabetes    patient denies ; see lab result for a1c 03-06-17 is 6.0  . Seizures ([place])    AFTER BRAIN SURGERY; none recently   . Weakness of right side of body    d/t brain tumor intervention     Past Surgical History:  Procedure Laterality Date  . ABDOMINAL HYSTERECTOMY  2010   ovaries not removed  . BRAIN SURGERY     tumor excision s/p craniectomy  . BREAST BIOPSY Left 2010   benign  . CATARACT EXTRACTION W/PHACO Left 11/07/2016   Procedure: CATARACT EXTRACTION PHACO AND INTRAOCULAR LENS PLACEMENT (IOC);  Surgeon: Estill Cotta, MD;  Location: [facility] ORS;  Service: Ophthalmology;  Laterality: Left;  [place] 00:53.9 AP% 16.7 CDE 17.02 Fluid Pack Lot # [id number]  . PARATHYROIDECTOMY Right 03/14/2017   Procedure: RIGHT PARATHYROIDECTOMY;  Surgeon: Armandina Gemma, MD;  Location: [facility] ORS;  Service: General;  Laterality: Right;    There were no vitals filed for this  visit.   Subjective Assessment - 07/17/18 1137    Subjective  Patient states condition started over Christmas she started to feel a pain in her left knee, so she thought she may have done something by putting up Christmas decorations. On New Years Day it was swollen and she called the doctor. She was seen the next day and radiograph was performed and found to have mild arthritis. Sunday at the [facility] after her visit she fell on the same knee and now she is unsure that her pain may be related to the fall. Last Friday, when we had a lot of rain it really hurt. It has been a lingering pian. She doesn't want to "be addicted to tylenol" but it helps some. She denies history of knee pain like this before. Denies pain elsewhere. Denies history of back pain. Denies increased stumbling or weakness.     Pertinent History  Patient is a 58 y.o. female who presents to outpatient physical therapy with a referral for medical diagnosis acute pain of left knee. This patient's chief complaints consist of left knee pain, swelling, increased medication use to deal with pain, decreased activity tolerance, stiffness leading to the following functional deficits: difficulty with prolonged positions, walking, sleeping, stairs, bending, squatting, sitting.  Relevant past medical history and comorbidities  include in 2003 she was diagnosed with a benign  brain tumor and since then she has had surgery for that. 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 After prolonged therapy she is able to walk and move again. She has a limp that is more noticeable when she is tired. 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 She doesn't have a lot of feeling in the right foot down past the ankle. [place] once in the last 6 months (due to rug). Hx of R ankle fracture, history of seizure disorder (last was when she was 2013), hyperparathyroidism and parathyroid adenoma (parathyroidectomy in 2018 recent follow up every thing looks fine). Notes popping  and cracking but not locking.     Diagnostic tests  Imaging: "IMPRESSION: Minimal degenerative joint disease is noted medially. No acute abnormality seen in the left knee."    Currently in Pain?  Yes    Pain Score  4    best 2/10, at worst 8-9/10   Pain Location  Knee   Left knee: medial left joint line   Pain Orientation  Left;Anterior;Medial    Pain Descriptors / Indicators  Throbbing;Burning;Pins and needles   throbbing, initially it was a burning or pins (denies numbness).    Pain Type  Acute pain    Pain Onset  More than a month ago    Pain Frequency  Intermittent    Aggravating Factors    prolonged sitting, moving it, walking over a period of time, going up and down stairs, getting in and out of the car.     Pain Relieving Factors  wrapping in ace bandage at night (compression), tylenol, resting temporarily helps it feel better.     Effect of Pain on Daily Activities  difficulty with prolonged positions, walking, sleeping, stairs, bending, squatting.       OBJECTIVE: OBSERVATION/INSPECTION: Patient presents with significantly larger muscle mass on the left thigh and lower leg compared to right. Stands with right genurecurvatum.   NEUROLOGICAL: Dermatomes: BLE dermatomes equal and intact to light touch Myotomes: see below (history of brain tumor/surgery damaged right motor and sensory functions).   SPINE MOTION Lumbar AROM:  *Indicates pain - All direction WFL, except extension limited to 25%. Does not affect knee symptoms.   PERIPHERAL JOINT MOTION (AROM/PROM in degrees):  *Indicates pain  Hip  - B hips WFL except lacking in extension Knee - Flexion: R = 13/0/135 firm end feel, L = 110/120.  - Extension: R = 10, L = 5.   STRENGTH:  *Indicates pain Hip  - Flexion: R = 3+/5, L = 4+/5 pain at medial knee.  - Extension: R = 4-/5, L = 4+/5. - Abduction: R = 4-/5, L = 4+/5. Knee - Ext: R = 4+/5, L = 3/5 limited by pian - Flex: R = 4+/5, L = 4/5 limited by pain Ankle  (seated position) - Dorsiflexion: R = 2/5, L = 5/5. - Plantarflexion: R = 1/5, L = 4/5. - Eversion: R = 0/5, L = 5/5. - Great Toe extension: R = 2/5, L = 4+/5.   SPECIAL TESTS: Straight leg raise (SLR): R = positive for posterior thigh tension, L = mildly positive for posterior thigh tension but not reproduction of chief complaint. Negative special tests for L knee: varus force at 0 and 30 degrees, Lachman's, anterior and posterior drawer, patellar gliding and tilting, recurvatum overpressure.  Positive special tests: Valgus at 0 and 30 degrees (pain, no excessive laxity), McMurray with tibia externally rotated (pain,  no catching).   ACCESSORY MOTION:  - Mildly hypomobile to AP, PA, rotational glides at talofemoral joint on left.   PALPATION: - TTP at medial joint line and along adductor and medial hamstring, mild TTP and posterior joint line.   FUNCTIONAL MOBILITY: - Bed mobility: sit < > supine and rolling, independent with some difficulty - Transfers: sit <> stand independent.  - Gait: ambulates independent household and short community distances.  - Stairs: not assessed.  Objective measurements completed on examination: See above findings.    TREATMENT:  Denies sensitivity to latex Denies history of long term steroid medications Denies any history of spine or knee surgery.   Therapeutic exercise: to centralize symptoms and improve ROM, strength, muscular endurance, and activity tolerance required for successful completion of functional activities.  - quad set with 5 second hold progressing to straight leg raise. 5 second hold x 10 on left side. Cuing for quad contraction. To improve quad strength and activity tolerance.  - hooklying bridge to strengthen hip extensors and hamstrings for functional activity. Cuing for technique and hip extension. X10 - Hooklying clamshells with isometric hold on contralateral side, red theraband. X 10 each side. Cuing for coordination. To improve  hip strength and coordination.  - Education on diagnosis, prognosis, POC, anatomy and physiology of current condition.  - Education on HEP including handout and red theraband.   Access Code: [email or web address]  URL: [email or web address]  Date: 07/16/2018  Prepared by: Rosita Kea   Exercises  Supine Active Straight Leg Raise - 10-15 reps - 1 second hold - 3 Sets - 1x daily - 7x weekly  Supine Bridge - 10-15 reps - 1 second hold - 3 Sets - 1x daily - 7x weekly  Hooklying Clamshell with Resistance - 10-15 reps - 1 second hold - 3 Sets - 1x daily - 7x weekly    Patient response to treatment:  Pt tolerated treatment well. Pt was able to complete all exercises with minimal to no lasting increase in pain or discomfort. Pt required multimodal cuing for proper technique and to facilitate improved neuromuscular control, strength, range of motion, and functional ability resulting in improved performance and form.    PT Education - 07/17/18 1142    Education Details  Exercise purpose/form. Self management techniques. Education on diagnosis, prognosis, POC, anatomy and physiology of current condition Education on HEP including handout     Person(s) Educated  Patient    Methods  Explanation;Demonstration;Tactile cues;Verbal cues    Comprehension  Verbalized understanding;Returned demonstration       PT Short Term Goals - 07/17/18 1206      PT SHORT TERM GOAL #1   Title  Be independent with initial home exercise program for self-management of symptoms.    Baseline  Initial HEP provided at IE (07/16/2018);     Time  2    Period  Weeks    Status  New    Target Date  07/31/18        Plan - 07/17/18 1159    Clinical Impression Statement  Patient is a 58 y.o. female referred to outpatient physical therapy with a medical diagnosis of acute pain of left knee who presents with signs and symptoms consistent with left knee pain, possibly due to MCL or medial meniscus strain/dysfunction. No  concerning locking or laxity noted upon exam. Patient presents with significant pain, swelling, stiffness, muscle tension, weakness, and balance impairments that are limiting ability to complete usual ADLs, IADLs, functional mobility and social/community participation (such  as transferring, squatting, navigating stairs, walking, bending, and lifting) without difficulty. Patient will benefit from skilled physical therapy intervention to address current body structure impairments and activity limitations to improve function and work towards goals set in current POC in order to return to prior level of function or maximal functional improvement    History and Personal Factors relevant to plan of care:  in 2003 she was diagnosed with a benign  brain tumor and since then she has had surgery for that. 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 After prolonged therapy she is able to walk and move again. She has a limp that is more noticeable when she is tired. 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 She doesn't have a lot of feeling in the right foot down past the ankle. [place] once in the last 6 months (due to rug). Hx of R ankle fracture, history of seizure disorder (last was when she was 2013), hyperparathyroidism and parathyroid adenoma (parathyroidectomy in 2018 recent follow up every thing looks fine).     Clinical Presentation  Evolving    Clinical Presentation due to:  pain not improving as expected without intervention    Clinical Decision Making  Moderate    Rehab Potential  Good    Clinical Impairments Affecting Rehab Potential  (+) motivation, experience recovering from funcitonal disability; (-) pre-existing motor and sensory loss, comorbidities, fear of meaning of pain.     PT Frequency  2x / week    PT Duration  6 weeks    PT Treatment/Interventions  ADLs/Self Care Home Management;Aquatic Therapy;Cryotherapy;Moist Heat;Gait training;Stair training;Functional mobility training;Therapeutic  activities;Therapeutic exercise;Balance training;Neuromuscular re-education;Patient/family education;Manual techniques;Compression bandaging;Passive range of motion;Dry needling;Taping;Spinal Manipulations;Joint Manipulations;Other (comment)   joint mobilizations grades I-IV   PT Next Visit Plan  assess response to HEP and progress as appropriate. Progresive LE strengthening as tolerated.     PT Home Exercise Plan  Medbridge Access Code: [email or web address]     Consulted and Agree with Plan of Care  Patient        Patient will benefit from skilled therapeutic intervention in order to improve the following deficits and impairments:  Abnormal gait, Decreased balance, Decreased endurance, Decreased mobility, Difficulty walking, Hypomobility, Increased muscle spasms, Impaired sensation, Other (comment), Decreased range of motion, Increased edema, Impaired perceived functional ability, Obesity, Decreased activity tolerance, Decreased coordination, Decreased strength, Hypermobility, Impaired flexibility, Pain, Postural dysfunction(incomplete knowledge of appropriate self-management techniques for current problem)  Visit Diagnosis: Left knee pain, unspecified chronicity  Muscle weakness (generalized)  Stiffness of left knee, not elsewhere classified  Difficulty in walking, not elsewhere classified     Problem List Patient Active Problem List   Diagnosis Date Noted  . Colon cancer screening 01/25/2018  . Hyperparathyroidism, primary ([place]) 03/11/2017  . 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 History of ankle fracture 11/04/2016  . Health care maintenance 08/22/2015  . Hyperglycemia 08/10/2015  . Hot flashes 02/14/2015  . Skin lesion of left arm 02/14/2015  . Gait disturbance 07/11/2014  . Right knee pain 12/29/2013  . Essential hypertension, benign 02/08/2013  . History of seizure disorder 09/28/2012  . History of brain tumor 09/11/2012    Nancy Nordmann, PT, DPT 07/17/2018, 12:13 PM  [facility] [address] [place], [address] Phone: [phone]   Fax:  [phone]  Name: Rachael Zimmerman MRN: [id number] Date of Birth: Jul 11, 1960

## 2018-07-17 NOTE — Therapy (Signed)
[facility] [address] [place], [address] Phone: [phone]   Fax:  [phone]  Physical Therapy Evaluation  Patient Details  Name: Rachael Zimmerman MRN: [id number] Date of Birth: 10/31/1960 Referring Provider (PT): Jodelle Green, FNP   Encounter Date: 07/16/2018  PT End of Session - 07/17/18 1142    Visit Number  1    Number of Visits  12    Date for PT Re-Evaluation  08/28/18    Authorization Type  [employer] Medicare reporting period from 07/16/2018    Authorization Time Period  Current cert period: 08/11/8248 - 08/28/2018 (latest PN: IE 07/16/2018)    Authorization - Visit Number  1    Authorization - Number of Visits  10    PT Start Time  1040    PT Stop Time  1140    PT Time Calculation (min)  60 min    Activity Tolerance  Patient tolerated treatment well    Behavior During Therapy  [facility] for tasks assessed/performed       Past Medical History:  Diagnosis Date  . 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[place])    s/p craniectomy  . Hypertension   . Pre-diabetes    patient denies ; see lab result for a1c 03-06-17 is 6.0  . Seizures ([place])    AFTER BRAIN SURGERY; none recently   . Weakness of right side of body    d/t brain tumor intervention     Past Surgical History:  Procedure Laterality Date  . ABDOMINAL HYSTERECTOMY  2010   ovaries not removed  . BRAIN SURGERY     tumor excision s/p craniectomy  . BREAST BIOPSY Left 2010   benign  . CATARACT EXTRACTION W/PHACO Left 11/07/2016   Procedure: CATARACT EXTRACTION PHACO AND INTRAOCULAR LENS PLACEMENT (IOC);  Surgeon: Estill Cotta, MD;  Location: [facility] ORS;  Service: Ophthalmology;  Laterality: Left;  [place] 00:53.9 AP% 16.7 CDE 17.02 Fluid Pack Lot # [id number]  . PARATHYROIDECTOMY Right 03/14/2017   Procedure: RIGHT PARATHYROIDECTOMY;  Surgeon: Armandina Gemma, MD;  Location: [facility] ORS;  Service: General;  Laterality: Right;    There were no vitals filed for this  visit.   Subjective Assessment - 07/17/18 1137    Subjective  Patient states condition started over Christmas she started to feel a pain in her left knee, so she thought she may have done something by putting up Christmas decorations. On New Years Day it was swollen and she called the doctor. She was seen the next day and radiograph was performed and found to have mild arthritis. Sunday at the [facility] after her visit she fell on the same knee and now she is unsure that her pain may be related to the fall. Last Friday, when we had a lot of rain it really hurt. It has been a lingering pian. She doesn't want to "be addicted to tylenol" but it helps some. She denies history of knee pain like this before. Denies pain elsewhere. Denies history of back pain. Denies increased stumbling or weakness.     Pertinent History  Patient is a 58 y.o. female who presents to outpatient physical therapy with a referral for medical diagnosis acute pain of left knee. This patient's chief complaints consist of left knee pain, swelling, increased medication use to deal with pain, decreased activity tolerance, stiffness leading to the following functional deficits: difficulty with prolonged positions, walking, sleeping, stairs, bending, squatting, sitting.  Relevant past medical history and comorbidities  include in 2003 she was diagnosed with a benign  brain tumor and since then she has had surgery for that. 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 After prolonged therapy she is able to walk and move again. She has a limp that is more noticeable when she is tired. 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 She doesn't have a lot of feeling in the right foot down past the ankle. [place] once in the last 6 months (due to rug). Hx of R ankle fracture, history of seizure disorder (last was when she was 2013), hyperparathyroidism and parathyroid adenoma (parathyroidectomy in 2018 recent follow up every thing looks fine). Notes popping  and cracking but not locking.     Diagnostic tests  Imaging: "IMPRESSION: Minimal degenerative joint disease is noted medially. No acute abnormality seen in the left knee."    Currently in Pain?  Yes    Pain Score  4    best 2/10, at worst 8-9/10   Pain Location  Knee   Left knee: medial left joint line   Pain Orientation  Left;Anterior;Medial    Pain Descriptors / Indicators  Throbbing;Burning;Pins and needles   throbbing, initially it was a burning or pins (denies numbness).    Pain Type  Acute pain    Pain Onset  More than a month ago    Pain Frequency  Intermittent    Aggravating Factors    prolonged sitting, moving it, walking over a period of time, going up and down stairs, getting in and out of the car.     Pain Relieving Factors  wrapping in ace bandage at night (compression), tylenol, resting temporarily helps it feel better.     Effect of Pain on Daily Activities  difficulty with prolonged positions, walking, sleeping, stairs, bending, squatting.          [facility] PT Assessment - 07/17/18 0001      Assessment   Medical Diagnosis  acute pain of left knee    Referring Provider (PT)  Guse, Jacquelynn Cree, FNP    Onset Date/Surgical Date  06/04/18    Prior Therapy  none for this problem; extensive therapy for paralyis in the past with recovery of funcition for independent functional mobliity a result      Precautions   Precautions  Other (comment)   R foot drop      Balance Screen   Has the patient fallen in the past 6 months  Yes    How many times?  1   tripped over rug)   Has the patient had a decrease in activity level because of a fear of falling?   No    Is the patient reluctant to leave their home because of a fear of falling?   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     Leisure  her husband has been in the hospital for the last 8 months so she has only been there and at work. Walking (used to do for exercises), traveling. Husband is diabetic and has  had 2 surgeries within the week. He is currently in rehab and the plan is for him to come home once he is released from rehab (right know he cannot put any pressure on feet). May increase her caregiving needs but will have help from nurse.       Cognition   Overall Cognitive Status  Within Functional Limits for tasks assessed      Observation/Other Assessments   Observations  see note from 07/15/2018 for latest objective data    Focus on Therapeutic Outcomes (FOTO)   38       OBJECTIVE: OBSERVATION/INSPECTION: Patient presents with significantly larger muscle mass on the left thigh and lower leg compared to right. Stands with right genurecurvatum.   NEUROLOGICAL: Dermatomes: BLE dermatomes equal and intact to light touch Myotomes: see below (history of brain tumor/surgery damaged right motor and sensory functions).   SPINE MOTION Lumbar AROM:  *Indicates pain - All direction WFL, except extension limited to 25%. Does not affect knee symptoms.   PERIPHERAL JOINT MOTION (AROM/PROM in degrees):  *Indicates pain  Hip  - B hips WFL except lacking in extension Knee - Flexion: R = 13/0/135 firm end feel, L = 110/120.  - Extension: R = 10, L = 5.   STRENGTH:  *Indicates pain Hip  - Flexion: R = 3+/5, L = 4+/5 pain at medial knee.  - Extension: R = 4-/5, L = 4+/5. - Abduction: R = 4-/5, L = 4+/5. Knee - Ext: R = 4+/5, L = 3/5 limited by pian - Flex: R = 4+/5, L = 4/5 limited by pain Ankle (seated position) - Dorsiflexion: R = 2/5, L = 5/5. - Plantarflexion: R = 1/5, L = 4/5. - Eversion: R = 0/5, L = 5/5. - Great Toe extension: R = 2/5, L =  4+/5.   SPECIAL TESTS: Straight leg raise (SLR): R = positive for posterior thigh tension, L = mildly positive for posterior thigh tension but not reproduction of chief complaint. Negative special tests for L knee: varus force at 0 and 30 degrees, Lachman's, anterior and posterior drawer, patellar gliding and tilting, recurvatum overpressure.  Positive special tests: Valgus at 0 and 30 degrees (pain, no excessive laxity), McMurray with tibia externally rotated (pain, no catching).   ACCESSORY MOTION:  - Mildly hypomobile to AP, PA, rotational glides at talofemoral joint on left.   PALPATION: - TTP at medial joint line and along adductor and medial hamstring, mild TTP and posterior joint line.   FUNCTIONAL MOBILITY: - Bed mobility: sit < > supine and rolling, independent with some difficulty - Transfers: sit <> stand independent.  - Gait: ambulates independent household and short community distances.  - Stairs: not assessed.  Objective measurements completed on examination: See above findings.     TREATMENT:  Denies sensitivity to latex Denies history of long term steroid medications Denies any history of spine or knee surgery.   Therapeutic exercise: to centralize symptoms and improve ROM, strength, muscular endurance, and activity tolerance required for successful completion of functional activities.  - quad set with 5 second hold progressing to straight leg raise. 5 second hold x 10 on left side. Cuing for quad contraction. To improve quad strength and activity tolerance.  - hooklying bridge to strengthen hip extensors and hamstrings for functional activity. Cuing for technique and hip extension. X10 - Hooklying clamshells with isometric hold on contralateral side, red theraband. X 10 each side. Cuing for coordination. To improve hip strength and coordination.  -  Education on diagnosis, prognosis, POC, anatomy and physiology of current condition.  - Education on HEP including  handout and red theraband.   Access Code: [email or web address]  URL: [email or web address]  Date: 07/16/2018  Prepared by: Rosita Kea   Exercises  Supine Active Straight Leg Raise - 10-15 reps - 1 second hold - 3 Sets - 1x daily - 7x weekly  Supine Bridge - 10-15 reps - 1 second hold - 3 Sets - 1x daily - 7x weekly  Hooklying Clamshell with Resistance - 10-15 reps - 1 second hold - 3 Sets - 1x daily - 7x weekly    Patient response to treatment:  Pt tolerated treatment well. Pt was able to complete all exercises with minimal to no lasting increase in pain or discomfort. Pt required multimodal cuing for proper technique and to facilitate improved neuromuscular control, strength, range of motion, and functional ability resulting in improved performance and form.           PT Education - 07/17/18 1142    Education Details  Exercise purpose/form. Self management techniques. Education on diagnosis, prognosis, POC, anatomy and physiology of current condition Education on HEP including handout     Person(s) Educated  Patient    Methods  Explanation;Demonstration;Tactile cues;Verbal cues    Comprehension  Verbalized understanding;Returned demonstration       PT Short Term Goals - 07/17/18 1206      PT SHORT TERM GOAL #1   Title  Be independent with initial home exercise program for self-management of symptoms.    Baseline  Initial HEP provided at IE (07/16/2018);     Time  2    Period  Weeks    Status  New    Target Date  07/31/18        PT Long Term Goals - 07/17/18 1206      PT LONG TERM GOAL #1   Title  Be independent with a long-term home exercise program for self-management of symptoms.     Baseline  Initial HEP provided at IE (07/16/2018);     Time  6    Period  Weeks    Status  New    Target Date  08/28/18      PT LONG TERM GOAL #2   Title  Demonstrate improved FOTO score by 10 units to demonstrate improvement in overall condition and self-reported functional  ability.     Baseline  FOTO = 38 (07/17/2018);     Time  6    Period  Weeks    Status  New    Target Date  08/28/18      PT LONG TERM GOAL #3   Title  Patient will demonstrate left knee strength 5/5 to demonstrate functional strength for improved pain functional mobility.      Baseline  see objective exam    Time  6    Period  Weeks    Status  New    Target Date  08/28/18      PT LONG TERM GOAL #4   Title  Improve left knee  AROM by/to equal or greater than 125 degrees flexion to allow patient to complete valued activities with less difficulty.     Baseline  110 degrees (07/16/2018);     Time  6    Period  Weeks    Status  New    Target Date  08/28/18      PT LONG TERM GOAL #5   Title  Complete community, work and/or  recreational activities without limitation due to current condition.     Baseline  difficulty with prolonged positions, walking, sleeping, stairs, bending, squatting (07/16/2018);     Time  6    Period  Days    Status  New    Target Date  08/28/18             Plan - 07/17/18 1159    Clinical Impression Statement  Patient is a 58 y.o. female referred to outpatient physical therapy with a medical diagnosis of acute pain of left knee who presents with signs and symptoms consistent with left knee pain, possibly due to MCL or medial meniscus strain/dysfunction. No concerning locking or laxity noted upon exam. Patient presents with significant pain, swelling, stiffness, muscle tension, weakness, and balance impairments that are limiting ability to complete usual ADLs, IADLs, functional mobility and social/community participation (such as transferring, squatting, navigating stairs, walking, bending, and lifting) without difficulty. Patient will benefit from skilled physical therapy intervention to address current body structure impairments and activity limitations to improve function and work towards goals set in current POC in order to return to prior level of function or  maximal functional improvement    History and Personal Factors relevant to plan of care:  in 2003 she was diagnosed with a benign  brain tumor and since then she has had surgery for that. 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 After prolonged therapy she is able to walk and move again. She has a limp that is more noticeable when she is tired. 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 She doesn't have a lot of feeling in the right foot down past the ankle. [place] once in the last 6 months (due to rug). Hx of R ankle fracture, history of seizure disorder (last was when she was 2013), hyperparathyroidism and parathyroid adenoma (parathyroidectomy in 2018 recent follow up every thing looks fine).     Clinical Presentation  Evolving    Clinical Presentation due to:  pain not improving as expected without intervention    Clinical Decision Making  Moderate    Rehab Potential  Good    Clinical Impairments Affecting Rehab Potential  (+) motivation, experience recovering from funcitonal disability; (-) pre-existing motor and sensory loss, comorbidities, fear of meaning of pain.     PT Frequency  2x / week    PT Duration  6 weeks    PT Treatment/Interventions  ADLs/Self Care Home Management;Aquatic Therapy;Cryotherapy;Moist Heat;Gait training;Stair training;Functional mobility training;Therapeutic activities;Therapeutic exercise;Balance training;Neuromuscular re-education;Patient/family education;Manual techniques;Compression bandaging;Passive range of motion;Dry needling;Taping;Spinal Manipulations;Joint Manipulations;Other (comment)   joint mobilizations grades I-IV   PT Next Visit Plan  assess response to HEP and progress as appropriate. Progresive LE strengthening as tolerated.     PT Home Exercise Plan  Medbridge Access Code: [email or web address]     Consulted and Agree with Plan of Care  Patient       Patient will benefit from skilled therapeutic intervention in order to improve the following deficits and  impairments:  Abnormal gait, Decreased balance, Decreased endurance, Decreased mobility, Difficulty walking, Hypomobility, Increased muscle spasms, Impaired sensation, Other (comment), Decreased range of motion, Increased edema, Impaired perceived functional ability, Obesity, Decreased activity tolerance, Decreased coordination, Decreased strength, Hypermobility, Impaired flexibility, Pain, Postural dysfunction(incomplete knowledge of appropriate self-management techniques for current problem)  Visit Diagnosis: Left knee pain, unspecified chronicity  Muscle weakness (generalized)  Stiffness of left knee, not elsewhere classified  Difficulty in walking, not elsewhere classified     Problem List  Patient Active Problem List   Diagnosis Date Noted  . Colon cancer screening 01/25/2018  . Hyperparathyroidism, primary ([place]) 03/11/2017  . 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 History of ankle fracture 11/04/2016  . Health care maintenance 08/22/2015  . Hyperglycemia 08/10/2015  . Hot flashes 02/14/2015  . Skin lesion of left arm 02/14/2015  . Gait disturbance 07/11/2014  . Right knee pain 12/29/2013  . Essential hypertension, benign 02/08/2013  . History of seizure disorder 09/28/2012  . History of brain tumor 09/11/2012    Nancy Nordmann, PT, DPT 07/17/2018, 12:15 PM  [place] [facility] [address] [place], [address] Phone: [phone]   Fax:  [phone]  Name: Rachael Zimmerman MRN: [id number] Date of Birth: 1960-10-30

## 2018-07-18 ENCOUNTER — Telehealth: Payer: Self-pay

## 2018-07-18 NOTE — Telephone Encounter (Signed)
Left message for patient to see if Dr Harlow Asa sent in Vitamin D rx.

## 2018-07-21 ENCOUNTER — Ambulatory Visit: Payer: Medicare Other | Admitting: Physical Therapy

## 2018-07-21 ENCOUNTER — Telehealth: Payer: Self-pay | Admitting: Internal Medicine

## 2018-07-21 NOTE — Telephone Encounter (Signed)
Spoke with Larena Glassman at [facility] to clarify what information needs to be relayed to the pt; Larena Glassman states that they received a fax on this pt the end of January related to lab values; they would like to know if the pt had gotten prescription for Vitamin D per Dr Armandina Gemma; if not this office can write the prescription;  attempted to initiate conference call with the pt; pt not available; left message on voicemail [phone] for pt to call the office; will route to office for notification of this encounter.

## 2018-07-21 NOTE — Telephone Encounter (Signed)
charted in error

## 2018-07-21 NOTE — Telephone Encounter (Signed)
This encounter was created in error - please disregard.

## 2018-07-21 NOTE — Telephone Encounter (Signed)
Spoke with patient to let her know that we did receive message from Dr. Harlow Asa. She was not aware of any of this. Patient is agreeable to take vitamin D. She just wants to know what she needs to do. Advised that I would call her back and let her know more info about how she should take, etc

## 2018-07-21 NOTE — Telephone Encounter (Signed)
Pt called to obtain information about phone message left 07/18/2018; pt given information per Garnette Scheuermann, "left message for patient to see if Dr Harlow Asa sent in Vitamin D rx."; pt states that she has been  In the hospital with her mother; the pt says that she is not sure why this message was left; will contact [facility] for clarification of information to be relayed to pt; she verbalizes understanding, states that she can be contacted at [phone], and a detailed message can be left.

## 2018-07-21 NOTE — Telephone Encounter (Signed)
Pt returned call, she is playing phone tag.  Please leave a detailed message. Pt is in and out most of the day.  Phone is [phone]

## 2018-07-21 NOTE — Addendum Note (Signed)
Addended by: Addison Naegeli on: 07/21/2018 07:42 AM   Modules accepted: Level of Service, SmartSet

## 2018-07-21 NOTE — Addendum Note (Signed)
Addended by: Addison Naegeli on: 07/21/2018 08:46 AM   Modules accepted: Level of Service, SmartSet

## 2018-07-22 ENCOUNTER — Other Ambulatory Visit: Payer: Self-pay | Admitting: Internal Medicine

## 2018-07-22 MED ORDER — VITAMIN D (ERGOCALCIFEROL) 1.25 MG (50000 UNIT) PO CAPS: 50000.0000 [IU] | ORAL_CAPSULE | ORAL | 1 refills | Status: DC

## 2018-07-22 NOTE — Progress Notes (Signed)
rx sent in for ergocalciferol 50,000 (#15 with one refill)

## 2018-07-22 NOTE — Telephone Encounter (Signed)
Notify pt that I have sent in the prescription for a prescription dose of vitamin D.  She is to take one tablet per week.  We will follow vitamin D level.  Let me know if any problems or questions.

## 2018-07-22 NOTE — Telephone Encounter (Signed)
Patient is aware that Vitamin D has been sent in. Patient is requesting to speak with you personally. She was never really told why she is needing this or what the results were and would feel better if she could speak with you. Advised that you were seeing patients this AM but I would send message over to you

## 2018-07-23 ENCOUNTER — Encounter: Payer: Self-pay | Admitting: Physical Therapy

## 2018-07-23 ENCOUNTER — Ambulatory Visit: Payer: Medicare Other | Admitting: Physical Therapy

## 2018-07-23 DIAGNOSIS — M25662 Stiffness of left knee, not elsewhere classified: Secondary | ICD-10-CM

## 2018-07-23 DIAGNOSIS — R262 Difficulty in walking, not elsewhere classified: Secondary | ICD-10-CM | POA: Diagnosis not present

## 2018-07-23 DIAGNOSIS — M6281 Muscle weakness (generalized): Secondary | ICD-10-CM

## 2018-07-23 DIAGNOSIS — M25562 Pain in left knee: Secondary | ICD-10-CM | POA: Diagnosis not present

## 2018-07-23 NOTE — Telephone Encounter (Signed)
Holding message until pt calls back.

## 2018-07-23 NOTE — Telephone Encounter (Signed)
Called her and left her messages.  Not able to speak with her.  Informed her if she needed to talk (since I have been unable to reach her), she could call office

## 2018-07-23 NOTE — Therapy (Signed)
[facility] [address] [place], [address] Phone: [phone]   Fax:  [phone]  Physical Therapy Treatment  Patient Details  Name: ANNELIES COYT MRN: [id number] Date of Birth: 07-24-1960 Referring Provider (PT): Jodelle Green, FNP   Encounter Date: 07/23/2018  PT End of Session - 07/23/18 0913    Visit Number  2    Number of Visits  12    Date for PT Re-Evaluation  08/28/18    Authorization Type  [employer] Medicare reporting period from 07/16/2018    Authorization Time Period  Current cert period: 06/17/7937 - 08/28/2018 (latest PN: [facility] 07/16/2018)    Authorization - Visit Number  2    Authorization - Number of Visits  10    PT Start Time  0905    PT Stop Time  0945    PT Time Calculation (min)  40 min    Activity Tolerance  Patient tolerated treatment well    Behavior During Therapy  [facility] for tasks assessed/performed       Past Medical History:  Diagnosis Date  . 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[place])    s/p craniectomy  . Hypertension   . Pre-diabetes    patient denies ; see lab result for a1c 03-06-17 is 6.0  . Seizures ([place])    AFTER BRAIN SURGERY; none recently   . Weakness of right side of body    d/t brain tumor intervention     Past Surgical History:  Procedure Laterality Date  . ABDOMINAL HYSTERECTOMY  2010   ovaries not removed  . BRAIN SURGERY     tumor excision s/p craniectomy  . BREAST BIOPSY Left 2010   benign  . CATARACT EXTRACTION W/PHACO Left 11/07/2016   Procedure: CATARACT EXTRACTION PHACO AND INTRAOCULAR LENS PLACEMENT (IOC);  Surgeon: Estill Cotta, MD;  Location: [facility] ORS;  Service: Ophthalmology;  Laterality: Left;  [place] 00:53.9 AP% 16.7 CDE 17.02 Fluid Pack Lot # [id number]  . PARATHYROIDECTOMY Right 03/14/2017   Procedure: RIGHT PARATHYROIDECTOMY;  Surgeon: Armandina Gemma, MD;  Location: [facility] ORS;  Service: General;  Laterality: Right;    There were no vitals filed for this  visit.  Subjective Assessment - 07/23/18 0910    Subjective  Patient reports she is feeling well this morning. She has been feeling sore overall since monday that she thinks may be related to the recent wet weather. She reports no pain upon arrival. She states she was "sore all over" following last treatment session (initial eval). She reports she is doing her HEP at night and in the morning in the bed. She reports no pain while doing the exercises.  she feels like her pain overall may be improving some.     Pertinent History  Patient is a 58 y.o. female who presents to outpatient physical therapy with a referral for medical diagnosis acute pain of left knee. This patient's chief complaints consist of left knee pain, swelling, increased medication use to deal with pain, decreased activity tolerance, stiffness leading to the following functional deficits: difficulty with prolonged positions, walking, sleeping, stairs, bending, squatting, sitting.  Relevant past medical history and comorbidities include in 2003 she was diagnosed with a benign  brain tumor and since then she has had surgery for that. 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 After prolonged therapy she is able to walk and move again. She has a limp that is more noticeable when she is tired. 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 She doesn't have a lot of feeling in the right foot down past the ankle. [place] once in the last 6 months (due to rug). Hx of R ankle fracture, history of seizure disorder (last was when she was 2013), hyperparathyroidism and parathyroid adenoma (parathyroidectomy in 2018 recent follow up every thing looks fine). Notes popping and cracking but not locking.     Diagnostic tests  Imaging: "IMPRESSION: Minimal degenerative joint disease is noted medially. No acute abnormality seen in the left knee."    Currently in Pain?  No/denies    Pain Onset  More than a month ago         TREATMENT:  Denies sensitivity to  latex Denies history of long term steroid medications Denies any history of spine or knee surgery.   Therapeutic exercise:to centralize symptoms and improve ROM, strength, muscular endurance, and activity tolerance required for successful completion of functional activities.  - NuStep level 3 using bilateral upper and lower extremities. Seat setting 7, handle setting 8. For improved extremity mobility, muscular endurance, and activity tolerance; and to induce the analgesic effect of aerobic exercise, stimulate improved joint nutrition, and prepare body structures and systems for following interventions. x 6  Minutes during subjective exam. Required assistance to get on and off and set up machine.  - quad set with 5 second hold progressing to straight leg raise. 5 second hold x 10 on left side. Cuing for quad contraction. To improve quad strength and activity tolerance.  - hooklying bridge to strengthen hip extensors and hamstrings for functional activity. Cuing for technique and hip extension. X30 with breaks as necessary. Blue theraband around distal femurs with concentration on equal isometric hip abduction to improve glute activation.  - Hooklying clamshells with isometric hold on contralateral side, blue theraband. 3X 10 each side. Cuing for coordination. To improve hip strength and coordination.  - supine short arc quad to improve quad strength and knee stability. Cuing for quad contraction and complete ROM. Mild left hip IR to find more comfortable position. 3x10 each side at 5# ankle weights.  - Education on diagnosis, prognosis, POC, anatomy and physiology of current condition.  - Education on HEP including blue theraband.    Manual therapy: to reduce pain and tissue tension, improve range of motion, neuromodulation, in order to promote improved ability to complete functional activities. - STM to left quads and hamstrings to reduce tension and pain - patellar mobilizations at left knee all  directions grades II-IV to improve motion and reduce pain.  - Seated left tibiofibular joint mobilization grade II-IV, distraction  Access Code: [email or web address]  URL: [email or web address]  Date: 07/16/2018  Prepared by: Rosita Kea   Exercises   Supine Active Straight Leg Raise - 10-15 reps - 1 second hold - 3 Sets - 1x daily - 7x weekly   Supine Bridge - 10-15 reps - 1 second hold - 3 Sets - 1x daily - 7x weekly   Hooklying Clamshell with Resistance - 10-15 reps - 1 second hold - 3 Sets - 1x daily - 7x weekly    Patient response to treatment:  Pt tolerated treatment well. Pt was able to complete all exercises with minimal to no lasting increase in pain or discomfort. Able to progress resistance and complexity of exercises. Pt required multimodal cuing for proper technique and to facilitate improved neuromuscular control, strength, range of motion, and functional ability resulting in improved performance and form.       PT Education - 07/23/18  [place]em    Education Details  Exercise purpose/form. Self management techniques. Education on diagnosis, prognosis, POC, anatomy and physiology of current condition    Person(s) Educated  Patient    Methods  Explanation;Demonstration;Tactile cues;Verbal cues    Comprehension  Verbalized understanding;Returned demonstration       PT Short Term Goals - 07/17/18 1206      PT SHORT TERM GOAL #1   Title  Be independent with initial home exercise program for self-management of symptoms.    Baseline  Initial HEP provided at IE (07/16/2018);     Time  2    Period  Weeks    Status  New    Target Date  07/31/18        PT Long Term Goals - 07/17/18 1206      PT LONG TERM GOAL #1   Title  Be independent with a long-term home exercise program for self-management of symptoms.     Baseline  Initial HEP provided at IE (07/16/2018);     Time  6    Period  Weeks    Status  New    Target Date  08/28/18      PT LONG TERM GOAL #2   Title   Demonstrate improved FOTO score by 10 units to demonstrate improvement in overall condition and self-reported functional ability.     Baseline  FOTO = 38 (07/17/2018);     Time  6    Period  Weeks    Status  New    Target Date  08/28/18      PT LONG TERM GOAL #3   Title  Patient will demonstrate left knee strength 5/5 to demonstrate functional strength for improved pain functional mobility.      Baseline  see objective exam    Time  6    Period  Weeks    Status  New    Target Date  08/28/18      PT LONG TERM GOAL #4   Title  Improve left knee  AROM by/to equal or greater than 125 degrees flexion to allow patient to complete valued activities with less difficulty.     Baseline  110 degrees (07/16/2018);     Time  6    Period  Weeks    Status  New    Target Date  08/28/18      PT LONG TERM GOAL #5   Title  Complete community, work and/or recreational activities without limitation due to current condition.     Baseline  difficulty with prolonged positions, walking, sleeping, stairs, bending, squatting (07/16/2018);     Time  6    Period  Days    Status  New    Target Date  08/28/18            Plan - 07/23/18 9326    Clinical Impression Statement  Patient missed her first follow visit but has returned today and is progressing towards goals at this point. She is demonstrating good tolerance to progressive exercise and manual techniques. She was unable to reproduce several of her HEP exercises from memory but appeared familiar with them with cuing. Patient is a 58 y.o. female referred to outpatient physical therapy with a medical diagnosis of acute pain of left knee who presents with signs and symptoms consistent with left knee pain, possibly due to MCL or medial meniscus strain/dysfunction. No concerning locking or laxity noted upon exam. Patient presents with significant pain, swelling, stiffness, muscle tension, weakness,  and balance impairments that are limiting ability to complete  usual ADLs, IADLs, functional mobility and social/community participation (such as transferring, squatting, navigating stairs, walking, bending, and lifting) without difficulty. Patient will benefit from skilled physical therapy intervention to address current body structure impairments and activity limitations to improve function and work towards goals set in current POC in order to return to prior level of function or maximal functional improvement    Rehab Potential  Good    Clinical Impairments Affecting Rehab Potential  (+) motivation, experience recovering from funcitonal disability; (-) pre-existing motor and sensory loss, comorbidities, fear of meaning of pain.     PT Frequency  2x / week    PT Duration  6 weeks    PT Treatment/Interventions  ADLs/Self Care Home Management;Aquatic Therapy;Cryotherapy;Moist Heat;Gait training;Stair training;Functional mobility training;Therapeutic activities;Therapeutic exercise;Balance training;Neuromuscular re-education;Patient/family education;Manual techniques;Compression bandaging;Passive range of motion;Dry needling;Taping;Spinal Manipulations;Joint Manipulations;Other (comment)   joint mobilizations grades I-IV   PT Next Visit Plan  Progresive LE strengthening as tolerated. Consider manual    PT Home Exercise Plan  Medbridge Access Code: [email or web address]     Consulted and Agree with Plan of Care  Patient       Patient will benefit from skilled therapeutic intervention in order to improve the following deficits and impairments:  Abnormal gait, Decreased balance, Decreased endurance, Decreased mobility, Difficulty walking, Hypomobility, Increased muscle spasms, Impaired sensation, Other (comment), Decreased range of motion, Increased edema, Impaired perceived functional ability, Obesity, Decreased activity tolerance, Decreased coordination, Decreased strength, Hypermobility, Impaired flexibility, Pain, Postural dysfunction(incomplete knowledge of appropriate  self-management techniques for current problem)  Visit Diagnosis: Left knee pain, unspecified chronicity  Muscle weakness (generalized)  Stiffness of left knee, not elsewhere classified  Difficulty in walking, not elsewhere classified     Problem List Patient Active Problem List   Diagnosis Date Noted  . Colon cancer screening 01/25/2018  . Hyperparathyroidism, primary ([place]) 03/11/2017  . 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 History of ankle fracture 11/04/2016  . Health care maintenance 08/22/2015  . Hyperglycemia 08/10/2015  . Hot flashes 02/14/2015  . Skin lesion of left arm 02/14/2015  . Gait disturbance 07/11/2014  . Right knee pain 12/29/2013  . Essential hypertension, benign 02/08/2013  . History of seizure disorder 09/28/2012  . History of brain tumor 09/11/2012    Nancy Nordmann, PT, DPT 07/23/2018, 9:55 AM  [facility] [address] [place], [address] Phone: [phone]   Fax:  [phone]  Name: ZYKERIAH MATHIA MRN: [id number] Date of Birth: August 13, 1960

## 2018-07-29 ENCOUNTER — Ambulatory Visit: Payer: Medicare Other | Admitting: Physical Therapy

## 2018-07-29 ENCOUNTER — Encounter: Payer: Self-pay | Admitting: Physical Therapy

## 2018-07-29 DIAGNOSIS — M6281 Muscle weakness (generalized): Secondary | ICD-10-CM

## 2018-07-29 DIAGNOSIS — M25662 Stiffness of left knee, not elsewhere classified: Secondary | ICD-10-CM | POA: Diagnosis not present

## 2018-07-29 DIAGNOSIS — M25562 Pain in left knee: Secondary | ICD-10-CM

## 2018-07-29 DIAGNOSIS — R262 Difficulty in walking, not elsewhere classified: Secondary | ICD-10-CM | POA: Diagnosis not present

## 2018-07-29 DIAGNOSIS — Z1211 Encounter for screening for malignant neoplasm of colon: Secondary | ICD-10-CM | POA: Diagnosis not present

## 2018-07-29 NOTE — Therapy (Signed)
[facility] [address], [place], [address] Phone: [phone]   Fax:  [phone]  Physical Therapy Treatment  Patient Details  Name: Rachael Zimmerman MRN: [id number] Date of Birth: 1960/12/01 Referring Provider (PT): Jodelle Green, FNP   Encounter Date: 07/29/2018  PT End of Session - 07/29/18 1741    Visit Number  3    Number of Visits  12    Date for PT Re-Evaluation  08/28/18    Authorization Type  [employer] Medicare reporting period from 07/16/2018    Authorization Time Period  Current cert period: 12/14/1948 - 08/28/2018 (latest PN: [facility] 07/16/2018)    Authorization - Visit Number  3    Authorization - Number of Visits  10    PT Start Time  1705    PT Stop Time  1745    PT Time Calculation (min)  40 min    Activity Tolerance  Patient tolerated treatment well    Behavior During Therapy  [facility] for tasks assessed/performed       Past Medical History:  Diagnosis Date  . 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[place])    s/p craniectomy  . Hypertension   . Pre-diabetes    patient denies ; see lab result for a1c 03-06-17 is 6.0  . Seizures ([place])    AFTER BRAIN SURGERY; none recently   . Weakness of right side of body    d/t brain tumor intervention     Past Surgical History:  Procedure Laterality Date  . ABDOMINAL HYSTERECTOMY  2010   ovaries not removed  . BRAIN SURGERY     tumor excision s/p craniectomy  . BREAST BIOPSY Left 2010   benign  . CATARACT EXTRACTION W/PHACO Left 11/07/2016   Procedure: CATARACT EXTRACTION PHACO AND INTRAOCULAR LENS PLACEMENT (IOC);  Surgeon: Estill Cotta, MD;  Location: [facility] ORS;  Service: Ophthalmology;  Laterality: Left;  [place] 00:53.9 AP% 16.7 CDE 17.02 Fluid Pack Lot # [id number]  . PARATHYROIDECTOMY Right 03/14/2017   Procedure: RIGHT PARATHYROIDECTOMY;  Surgeon: Armandina Gemma, MD;  Location: [facility] ORS;  Service: General;  Laterality: Right;    There were no vitals filed for this  visit.  Subjective Assessment - 07/29/18 1711    Subjective  Pateint reports she is feeling well with some pain in the left medial knee of 3/10. She states she felt okay after her last treatment session with no excessive pain or soreness. She states her HEP is going well and she is doing them 1-2 times per day.  She reports she had an AFO for the right side many years ago but has not used it for years. She states she has been cued previously to prevent hyperextension of the right knee, but she has trouble with it due to not being able to feel that leg.     Pertinent History  Patient is a 58 y.o. female who presents to outpatient physical therapy with a referral for medical diagnosis acute pain of left knee. This patient's chief complaints consist of left knee pain, swelling, increased medication use to deal with pain, decreased activity tolerance, stiffness leading to the following functional deficits: difficulty with prolonged positions, walking, sleeping, stairs, bending, squatting, sitting.  Relevant past medical history and comorbidities include in 2003 she was diagnosed with a benign  brain tumor and since then she has had surgery for that. 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 After prolonged therapy she is able to walk and move again.  She has a limp that is more noticeable when she is tired. 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 She doesn't have a lot of feeling in the right foot down past the ankle. [place] once in the last 6 months (due to rug). Hx of R ankle fracture, history of seizure disorder (last was when she was 2013), hyperparathyroidism and parathyroid adenoma (parathyroidectomy in 2018 recent follow up every thing looks fine). Notes popping and cracking but not locking.     Diagnostic tests  Imaging: "IMPRESSION: Minimal degenerative joint disease is noted medially. No acute abnormality seen in the left knee."    Currently in Pain?  Yes    Pain Score  3     Pain Location  Knee     Pain Orientation  Left;Anterior;Medial    Pain Descriptors / Indicators  Other (Comment)   "hit or miss" not sharp, throbbing , or burning"   Pain Type  Acute pain    Pain Onset  More than a month ago    Pain Frequency  Intermittent        TREATMENT: Denies sensitivity to latex Denies history of long term steroid medications Denies any history of spine or knee surgery.   Therapeutic exercise:to centralize symptoms and improve ROM, strength, muscular endurance, and activity tolerance required for successful completion of functional activities.  - NuStep level 5 using bilateral upper and lower extremities. Seat setting 6, handle setting 7. For improved extremity mobility, muscular endurance, and activity tolerance; and to induce the analgesic effect of aerobic exercise, stimulate improved joint nutrition, and prepare body structures and systems for following interventions. x 6  Minutes during subjective exam. Required assistance to get on and off and set up machine.  - supine short arc quad to improve quad strength and knee stability. Cuing for quad contraction and complete ROM. 3x10 each side at 7.5# ankle weights.  - seated long arc quad to improve quad strength and knee stability. Cuing for quad contraction and complete ROM. Mild left hip IR to find more comfortable position. 3x10 each side at 7.5# ankle weights.  - standing heel raises on 20 degree slant board, x15. To improve gastroc strength and range of motion. BUE support. Cuing to keep hips forward.  - standing hip abduction at hip machine, x10 each side. 25# right leg, 10# left leg. Cuing for form. Facing outwards to prevent compensatory hip flexion. - Education on diagnosis, prognosis, POC, anatomy and physiology of current condition.   Manual therapy: to reduce pain and tissue tension, improve range of motion, neuromodulation, in order to promote improved ability to complete functional activities. - STM to left quads and  hamstrings to reduce tension and pain - patellar mobilizations at left knee all directions grades II-IV to improve motion and reduce pain.  - Hooklying left tibiofibular joint mobilization grade II-IV, AP, PA and gentle rotational each side.   Access Code: [email or web address]  URL: [email or web address]  Date: 07/16/2018  Prepared by: Rosita Kea   Exercises   Supine Active Straight Leg Raise - 10-15 reps - 1 second hold - 3 Sets - 1x daily - 7x weekly   Supine Bridge - 10-15 reps - 1 second hold - 3 Sets - 1x daily - 7x weekly   Hooklying Clamshell with Resistance - 10-15 reps - 1 second hold - 3 Sets - 1x daily - 7x weekly   Patient response to treatment:  Pt tolerated treatment well. Pt was able to complete all exercises with minimal to  no lasting increase in pain or discomfort. Able to progress resistance and to standing exercises. Pt required multimodal cuing for proper technique and to facilitate improved neuromuscular control, strength, range of motion, and functional ability resulting in improved performance and form.     PT Education - 07/29/18 1718    Education Details  Exercise purpose/form. Self management techniques. Education on diagnosis, prognosis, POC, anatomy and physiology of current condition    Person(s) Educated  Patient    Methods  Explanation;Demonstration;Tactile cues;Verbal cues    Comprehension  Verbalized understanding;Returned demonstration       PT Short Term Goals - 07/29/18 1805      PT SHORT TERM GOAL #1   Title  Be independent with initial home exercise program for self-management of symptoms.    Baseline  Initial HEP provided at [facility] (07/16/2018);     Time  2    Period  Weeks    Status  Achieved    Target Date  07/31/18        PT Long Term Goals - 07/17/18 1206      PT LONG TERM GOAL #1   Title  Be independent with a long-term home exercise program for self-management of symptoms.     Baseline  Initial HEP provided at [facility] (07/16/2018);      Time  6    Period  Weeks    Status  New    Target Date  08/28/18      PT LONG TERM GOAL #2   Title  Demonstrate improved FOTO score by 10 units to demonstrate improvement in overall condition and self-reported functional ability.     Baseline  FOTO = 38 (07/17/2018);     Time  6    Period  Weeks    Status  New    Target Date  08/28/18      PT LONG TERM GOAL #3   Title  Patient will demonstrate left knee strength 5/5 to demonstrate functional strength for improved pain functional mobility.      Baseline  see objective exam    Time  6    Period  Weeks    Status  New    Target Date  08/28/18      PT LONG TERM GOAL #4   Title  Improve left knee  AROM by/to equal or greater than 125 degrees flexion to allow patient to complete valued activities with less difficulty.     Baseline  110 degrees (07/16/2018);     Time  6    Period  Weeks    Status  New    Target Date  08/28/18      PT LONG TERM GOAL #5   Title  Complete community, work and/or recreational activities without limitation due to current condition.     Baseline  difficulty with prolonged positions, walking, sleeping, stairs, bending, squatting (07/16/2018);     Time  6    Period  Days    Status  New    Target Date  08/28/18            Plan - 07/29/18 1814    Clinical Impression Statement   Patient is attending more regularly now and is progressing towards goals. She had less discomfort with manual and was able to progress to standing exercises. Her right knee is at risk for breakdown over time due to posterior capsule stretching with passive stability in recurvatum. Pt may benefit form AFO to better support right knee. Better  right knee stability may improve forces on left knee.  She is demonstrating good tolerance to progressive exercise and manual techniques.  Patient is a 58 y.o. female referred to outpatient physical therapy with a medical diagnosis of acute pain of left knee who presents with signs and symptoms  consistent with left knee pain, possibly due to MCL or medial meniscus strain/dysfunction. No concerning locking or laxity noted upon exam. Patient presents with significant pain, swelling, stiffness, muscle tension, weakness, and balance impairments that are limiting ability to complete usual ADLs, IADLs, functional mobility and social/community participation (such as transferring, squatting, navigating stairs, walking, bending, and lifting) without difficulty. Patient will benefit from skilled physical therapy intervention to address current body structure impairments and activity limitations to improve function and work towards goals set in current POC in order to return to prior level of function or maximal functional improvement    Rehab Potential  Good    Clinical Impairments Affecting Rehab Potential  (+) motivation, experience recovering from funcitonal disability; (-) pre-existing motor and sensory loss, comorbidities, fear of meaning of pain.     PT Frequency  2x / week    PT Duration  6 weeks    PT Treatment/Interventions  ADLs/Self Care Home Management;Aquatic Therapy;Cryotherapy;Moist Heat;Gait training;Stair training;Functional mobility training;Therapeutic activities;Therapeutic exercise;Balance training;Neuromuscular re-education;Patient/family education;Manual techniques;Compression bandaging;Passive range of motion;Dry needling;Taping;Spinal Manipulations;Joint Manipulations;Other (comment)   joint mobilizations grades I-IV   PT Next Visit Plan  Progresive LE strengthening as tolerated.     PT Home Exercise Plan  Medbridge Access Code: [email or web address]     Consulted and Agree with Plan of Care  Patient       Patient will benefit from skilled therapeutic intervention in order to improve the following deficits and impairments:  Abnormal gait, Decreased balance, Decreased endurance, Decreased mobility, Difficulty walking, Hypomobility, Increased muscle spasms, Impaired sensation, Other (comment),  Decreased range of motion, Increased edema, Impaired perceived functional ability, Obesity, Decreased activity tolerance, Decreased coordination, Decreased strength, Hypermobility, Impaired flexibility, Pain, Postural dysfunction(incomplete knowledge of appropriate self-management techniques for current problem)  Visit Diagnosis: Left knee pain, unspecified chronicity  Muscle weakness (generalized)  Stiffness of left knee, not elsewhere classified  Difficulty in walking, not elsewhere classified     Problem List Patient Active Problem List   Diagnosis Date Noted  . Colon cancer screening 01/25/2018  . Hyperparathyroidism, primary ([place]) 03/11/2017  . 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 History of ankle fracture 11/04/2016  . Health care maintenance 08/22/2015  . Hyperglycemia 08/10/2015  . Hot flashes 02/14/2015  . Skin lesion of left arm 02/14/2015  . Gait disturbance 07/11/2014  . Right knee pain 12/29/2013  . Essential hypertension, benign 02/08/2013  . History of seizure disorder 09/28/2012  . History of brain tumor 09/11/2012    Nancy Nordmann, PT, DPT 07/29/2018, 6:14 PM  [facility] [address], [place], [address] Phone: [phone]   Fax:  [phone]  Name: Rachael Zimmerman MRN: [id number] Date of Birth: May 15, 1961

## 2018-07-31 ENCOUNTER — Ambulatory Visit: Payer: Medicare Other

## 2018-08-05 ENCOUNTER — Telehealth: Payer: Self-pay | Admitting: Physical Therapy

## 2018-08-05 ENCOUNTER — Ambulatory Visit: Payer: Medicare Other | Admitting: Physical Therapy

## 2018-08-05 NOTE — Telephone Encounter (Signed)
Called pt when she did not show up for her appointment today at 4:15pm. Notified her of missed appointment and of her next scheduled appt 08/07/2018 at 6:15pm. Requested a call back at [phone] to let us know she is okay and plans to come to next appt,

## 2018-08-07 ENCOUNTER — Encounter: Payer: Self-pay | Admitting: Physical Therapy

## 2018-08-07 ENCOUNTER — Ambulatory Visit: Payer: Medicare Other | Admitting: Physical Therapy

## 2018-08-07 ENCOUNTER — Ambulatory Visit
Admission: RE | Admit: 2018-08-07 | Discharge: 2018-08-07 | Disposition: A | Discharge status: Home / Self Care | Payer: Medicare Other | Source: Ambulatory Visit | Attending: Internal Medicine | Admitting: Internal Medicine

## 2018-08-07 DIAGNOSIS — M25562 Pain in left knee: Secondary | ICD-10-CM

## 2018-08-07 DIAGNOSIS — M6281 Muscle weakness (generalized): Secondary | ICD-10-CM | POA: Diagnosis not present

## 2018-08-07 DIAGNOSIS — Z1231 Encounter for screening mammogram for malignant neoplasm of breast: Secondary | ICD-10-CM | POA: Insufficient documentation

## 2018-08-07 DIAGNOSIS — M25662 Stiffness of left knee, not elsewhere classified: Secondary | ICD-10-CM | POA: Diagnosis not present

## 2018-08-07 DIAGNOSIS — R262 Difficulty in walking, not elsewhere classified: Secondary | ICD-10-CM | POA: Diagnosis not present

## 2018-08-07 NOTE — Therapy (Signed)
[facility] [address] [place], [address] Phone: [phone]   Fax:  [phone]  Physical Therapy Treatment  Patient Details  Name: Rachael Zimmerman MRN: [id number] Date of Birth: 05-15-1961 Referring Provider (PT): Jodelle Green, FNP   Encounter Date: 08/07/2018  PT End of Session - 08/07/18 1757    Visit Number  4    Number of Visits  12    Date for PT Re-Evaluation  08/28/18    Authorization Type  [employer] Medicare reporting period from 07/16/2018    Authorization Time Period  Current cert period: 11/16/5914 - 08/28/2018 (latest PN: IE 07/16/2018)    Authorization - Visit Number  4    Authorization - Number of Visits  10    PT Start Time  1740    PT Stop Time  1820    PT Time Calculation (min)  40 min    Activity Tolerance  Patient tolerated treatment well    Behavior During Therapy  [facility] for tasks assessed/performed       Past Medical History:  Diagnosis Date  . 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[place])    s/p craniectomy  . Hypertension   . Pre-diabetes    patient denies ; see lab result for a1c 03-06-17 is 6.0  . Seizures ([place])    AFTER BRAIN SURGERY; none recently   . Weakness of right side of body    d/t brain tumor intervention     Past Surgical History:  Procedure Laterality Date  . ABDOMINAL HYSTERECTOMY  2010   ovaries not removed  . BRAIN SURGERY     tumor excision s/p craniectomy  . BREAST BIOPSY Left 2010   benign  . CATARACT EXTRACTION W/PHACO Left 11/07/2016   Procedure: CATARACT EXTRACTION PHACO AND INTRAOCULAR LENS PLACEMENT (IOC);  Surgeon: Estill Cotta, MD;  Location: [facility] ORS;  Service: Ophthalmology;  Laterality: Left;  [place] 00:53.9 AP% 16.7 CDE 17.02 Fluid Pack Lot # [id number]  . PARATHYROIDECTOMY Right 03/14/2017   Procedure: RIGHT PARATHYROIDECTOMY;  Surgeon: Armandina Gemma, MD;  Location: [facility];  Service: General;  Laterality: Right;    There were no vitals filed for this  visit.  Subjective Assessment - 08/07/18 1751    Subjective  Patient reports she is feeling well and has no pain upon arrival. She cannot remember the last time she had pain but has been distracted with some car trouble that prevented her from coming to her last appointment. She reports no excessive soreness following last treatment session.   She is doing her HEP at home regularly    Pertinent History  Patient is a 58 y.o. female who presents to outpatient physical therapy with a referral for medical diagnosis acute pain of left knee. This patient's chief complaints consist of left knee pain, swelling, increased medication use to deal with pain, decreased activity tolerance, stiffness leading to the following functional deficits: difficulty with prolonged positions, walking, sleeping, stairs, bending, squatting, sitting.  Relevant past medical history and comorbidities include in 2003 she was diagnosed with a benign  brain tumor and since then she has had surgery for that. 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 After prolonged therapy she is able to walk and move again. She has a limp that is more noticeable when she is tired. 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 She doesn't have a lot of feeling in the right foot down past the ankle. [place] once in the last 6 months (  due to rug). Hx of R ankle fracture, history of seizure disorder (last was when she was 2013), hyperparathyroidism and parathyroid adenoma (parathyroidectomy in 2018 recent follow up every thing looks fine). Notes popping and cracking but not locking.     Diagnostic tests  Imaging: "IMPRESSION: Minimal degenerative joint disease is noted medially. No acute abnormality seen in the left knee."    Currently in Pain?  No/denies    Pain Onset  More than a month ago        TREATMENT: Denies sensitivity to latex Denies history of long term steroid medications Denies any history of spine or knee surgery.   Therapeutic  exercise:to centralize symptoms and improve ROM, strength, muscular endurance, and activity tolerance required for successful completion of functional activities.  -NuStep level5using bilateral upper and lower extremities. Seat setting 6, handle setting 7. For improved extremity mobility, muscular endurance, and activity tolerance; and to induce the analgesic effect of aerobic exercise, stimulate improved joint nutrition, and prepare body structures and systems for following interventions. X 5 minutes. Required assistance to get on and off and set up machine. - supine short arc quad to improve quad strength and knee stability. Cuing for quad contraction and complete ROM. 3x10 each side at 10# ankle weights. - seated long arc quad to improve quad strength and knee stability. Cuing for quad contraction and complete ROM. Mild left hip IR to find more comfortable position. 3x10 each side at 10# ankle weights. - standing heel raises on 20 degree slant board, 2x15. To improve gastroc strength and range of motion. BUE support. Cuing to keep hips forward.  - standing hamstring curls to increase knee stability and strength. Cuing for technique. 2x10 with 5# ankle weights.  - standing hip abduction at hip machine, 2x10 each side. 25#  Cuing for form. Facing outwards to prevent compensatory hip flexion. - Education on diagnosis, prognosis, POC, anatomy and physiology of current condition.   Manual therapy:to reduce pain and tissue tension, improve range of motion, neuromodulation, in order to promote improved ability to complete functional activities. - STM to left quads and hamstrings to reduce tension and pain - patellar mobilizations at left knee all directions grades II-IV to improve motion and reduce pain.    Access Code: [email or web address]  URL: [email or web address]  Date: 07/16/2018  Prepared by: Rosita Kea   Exercises   Supine Active Straight Leg Raise - 10-15 reps - 1 second hold -  3 Sets - 1x daily - 7x weekly   Supine Bridge - 10-15 reps - 1 second hold - 3 Sets - 1x daily - 7x weekly   Hooklying Clamshell with Resistance - 10-15 reps - 1 second hold - 3 Sets - 1x daily - 7x weekly      PT Education - 08/07/18 1757    Education Details  Exercise purpose/form. Self management techniques. Education on diagnosis, prognosis, POC, anatomy and physiology of current condition    Person(s) Educated  Patient    Methods  Explanation;Demonstration;Tactile cues;Verbal cues    Comprehension  Verbalized understanding;Returned demonstration       PT Short Term Goals - 07/29/18 1805      PT SHORT TERM GOAL #1   Title  Be independent with initial home exercise program for self-management of symptoms.    Baseline  Initial HEP provided at IE (07/16/2018);     Time  2    Period  Weeks    Status  Achieved    Target Date  07/31/18  PT Long Term Goals - 07/17/18 1206      PT LONG TERM GOAL #1   Title  Be independent with a long-term home exercise program for self-management of symptoms.     Baseline  Initial HEP provided at IE (07/16/2018);     Time  6    Period  Weeks    Status  New    Target Date  08/28/18      PT LONG TERM GOAL #2   Title  Demonstrate improved FOTO score by 10 units to demonstrate improvement in overall condition and self-reported functional ability.     Baseline  FOTO = 38 (07/17/2018);     Time  6    Period  Weeks    Status  New    Target Date  08/28/18      PT LONG TERM GOAL #3   Title  Patient will demonstrate left knee strength 5/5 to demonstrate functional strength for improved pain functional mobility.      Baseline  see objective exam    Time  6    Period  Weeks    Status  New    Target Date  08/28/18      PT LONG TERM GOAL #4   Title  Improve left knee  AROM by/to equal or greater than 125 degrees flexion to allow patient to complete valued activities with less difficulty.     Baseline  110 degrees (07/16/2018);     Time  6     Period  Weeks    Status  New    Target Date  08/28/18      PT LONG TERM GOAL #5   Title  Complete community, work and/or recreational activities without limitation due to current condition.     Baseline  difficulty with prolonged positions, walking, sleeping, stairs, bending, squatting (07/16/2018);     Time  6    Period  Days    Status  New    Target Date  08/28/18            Plan - 08/07/18 1826    Clinical Impression Statement  Pt tolerated treatment well. She had no pain upon arrival and was able to tolerate progressions in exercises without inducing pain. She she is showing improved activity tolerance on the left leg but also continues to be tender and tight in the left hamstrings that improves with manual. Patient was having trouble with car and missed last treatment session but plans to improve attendance in the future. She is continuing to complete her HEP with good results so far. Pt required multimodal cuing for proper technique and to facilitate improved neuromuscular control, strength, range of motion, and functional ability resulting in improved performance and form.Patient will benefit from skilled physical therapy intervention to address current body structure impairments and activity limitations to improve function and work towards goals set in current POC in order to return to prior level of function or maximal functional improvement    Rehab Potential  Good    Clinical Impairments Affecting Rehab Potential  (+) motivation, experience recovering from funcitonal disability; (-) pre-existing motor and sensory loss, comorbidities, fear of meaning of pain.     PT Frequency  2x / week    PT Duration  6 weeks    PT Treatment/Interventions  ADLs/Self Care Home Management;Aquatic Therapy;Cryotherapy;Moist Heat;Gait training;Stair training;Functional mobility training;Therapeutic activities;Therapeutic exercise;Balance training;Neuromuscular re-education;Patient/family  education;Manual techniques;Compression bandaging;Passive range of motion;Dry needling;Taping;Spinal Manipulations;Joint Manipulations;Other (comment)   joint mobilizations grades  I-IV   PT Next Visit Plan  Progresive LE strengthening as tolerated.     PT Home Exercise Plan  Medbridge Access Code: [email or web address]     Consulted and Agree with Plan of Care  Patient       Patient will benefit from skilled therapeutic intervention in order to improve the following deficits and impairments:  Abnormal gait, Decreased balance, Decreased endurance, Decreased mobility, Difficulty walking, Hypomobility, Increased muscle spasms, Impaired sensation, Other (comment), Decreased range of motion, Increased edema, Impaired perceived functional ability, Obesity, Decreased activity tolerance, Decreased coordination, Decreased strength, Hypermobility, Impaired flexibility, Pain, Postural dysfunction(incomplete knowledge of appropriate self-management techniques for current problem)  Visit Diagnosis: Left knee pain, unspecified chronicity  Muscle weakness (generalized)  Stiffness of left knee, not elsewhere classified  Difficulty in walking, not elsewhere classified     Problem List Patient Active Problem List   Diagnosis Date Noted  . Colon cancer screening 01/25/2018  . Hyperparathyroidism, primary ([place]) 03/11/2017  . 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 History of ankle fracture 11/04/2016  . Health care maintenance 08/22/2015  . Hyperglycemia 08/10/2015  . Hot flashes 02/14/2015  . Skin lesion of left arm 02/14/2015  . Gait disturbance 07/11/2014  . Right knee pain 12/29/2013  . Essential hypertension, benign 02/08/2013  . History of seizure disorder 09/28/2012  . History of brain tumor 09/11/2012    Nancy Nordmann, PT, DPT 08/07/2018, 6:27 PM  [facility] [address] [place], [address] Phone: [phone]   Fax:   [phone]  Name: Rachael Zimmerman MRN: [id number] Date of Birth: 05-15-1961

## 2018-08-12 ENCOUNTER — Ambulatory Visit: Payer: Medicare Other | Attending: Family Medicine | Admitting: Physical Therapy

## 2018-08-12 DIAGNOSIS — M25562 Pain in left knee: Secondary | ICD-10-CM | POA: Diagnosis not present

## 2018-08-12 DIAGNOSIS — M6281 Muscle weakness (generalized): Secondary | ICD-10-CM | POA: Insufficient documentation

## 2018-08-12 DIAGNOSIS — M25662 Stiffness of left knee, not elsewhere classified: Secondary | ICD-10-CM | POA: Diagnosis not present

## 2018-08-12 DIAGNOSIS — R262 Difficulty in walking, not elsewhere classified: Secondary | ICD-10-CM | POA: Insufficient documentation

## 2018-08-12 NOTE — Therapy (Signed)
[facility] [address] [place], [address] Phone: [phone]   Fax:  [phone]  Physical Therapy Treatment  Patient Details  Name: Rachael Zimmerman MRN: [id number] Date of Birth: 1961-02-08 Referring Provider (PT): Jodelle Green, FNP   Encounter Date: 08/12/2018  PT End of Session - 08/12/18 1403    Visit Number  5    Number of Visits  12    Date for PT Re-Evaluation  08/28/18    Authorization Type  [employer] Medicare reporting period from 07/16/2018    Authorization Time Period  Current cert period: 11/17/6293 - 08/28/2018 (latest PN: IE 07/16/2018)    Authorization - Visit Number  5    Authorization - Number of Visits  10    PT Start Time  1315    PT Stop Time  1355    PT Time Calculation (min)  40 min    Activity Tolerance  Patient limited by pain    Behavior During Therapy  [facility] for tasks assessed/performed       Past Medical History:  Diagnosis Date  . 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[place])    s/p craniectomy  . Hypertension   . Pre-diabetes    patient denies ; see lab result for a1c 03-06-17 is 6.0  . Seizures ([place])    AFTER BRAIN SURGERY; none recently   . Weakness of right side of body    d/t brain tumor intervention     Past Surgical History:  Procedure Laterality Date  . ABDOMINAL HYSTERECTOMY  2010   ovaries not removed  . BRAIN SURGERY     tumor excision s/p craniectomy  . BREAST BIOPSY Left 2010   benign  . CATARACT EXTRACTION W/PHACO Left 11/07/2016   Procedure: CATARACT EXTRACTION PHACO AND INTRAOCULAR LENS PLACEMENT (IOC);  Surgeon: Estill Cotta, MD;  Location: [facility] ORS;  Service: Ophthalmology;  Laterality: Left;  [place] 00:53.9 AP% 16.7 CDE 17.02 Fluid Pack Lot # [id number]  . PARATHYROIDECTOMY Right 03/14/2017   Procedure: RIGHT PARATHYROIDECTOMY;  Surgeon: Armandina Gemma, MD;  Location: [facility];  Service: General;  Laterality: Right;    There were no vitals filed for this visit.  Subjective  Assessment - 08/12/18 1359    Subjective  Patient reports she has had elevated pain (8/10) in the posterior left knee and posterior thigh. She states she continued doing her HEP to try to see if it would loosen up but she found that the SLR made it feel worse and she could do only 8 reps at a time and it felt bad following. She reports she tried to do the SLR seated in a chair.     Pertinent History  Patient is a 58 y.o. female who presents to outpatient physical therapy with a referral for medical diagnosis acute pain of left knee. This patient's chief complaints consist of left knee pain, swelling, increased medication use to deal with pain, decreased activity tolerance, stiffness leading to the following functional deficits: difficulty with prolonged positions, walking, sleeping, stairs, bending, squatting, sitting.  Relevant past medical history and comorbidities include in 2003 she was diagnosed with a benign  brain tumor and since then she has had surgery for that. 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 After prolonged therapy she is able to walk and move again. She has a limp that is more noticeable when she is tired. 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 She doesn't have a lot of feeling in the  right foot down past the ankle. [place] once in the last 6 months (due to rug). Hx of R ankle fracture, history of seizure disorder (last was when she was 2013), hyperparathyroidism and parathyroid adenoma (parathyroidectomy in 2018 recent follow up every thing looks fine). Notes popping and cracking but not locking.     Diagnostic tests  Imaging: "IMPRESSION: Minimal degenerative joint disease is noted medially. No acute abnormality seen in the left knee."    Currently in Pain?  Yes    Pain Score  8     Pain Location  Leg    Pain Orientation  Left;Posterior;Medial    Pain Radiating Towards  left posterior and medial thigh, left knee medial anterior    Pain Onset  More than a month ago          TREATMENT: Denies sensitivity to latex Denies history of long term steroid medications Denies any history of spine or knee surgery.   Therapeutic exercise:to centralize symptoms and improve ROM, strength, muscular endurance, and activity tolerance required for successful completion of functional activities.  - standing lumbar extension over edge of counter x15, cuing to reach end range. To assess effect on posterior leg pain. No effect.  - prone press ups 2x10 to assess effect on leg pain. Increased leg pain at one rep where she got to very end range (unable to rule out hamstring contraction due to effort), no effect following.  - straight leg raise on left to assess response after manual interventions and repeated lumbar extensions.  - supine short arc quad to improve quad strength and knee stability. Cuing for quad contraction and complete ROM. 3x10 each side at5# ankle weight on right, no weight on left. Attempted 5# ankle weight on left but painful during motion so discontinued.  - Education on diagnosis, prognosis, POC, anatomy and physiology of current condition.   Manual therapy:to reduce pain and tissue tension, improve range of motion, neuromodulation, in order to promote improved ability to complete functional activities. - STM to left adductors and medial hamstrings to reduce tension and pain - Passive ROM stretch to the left hip adductors with pt isometric contraction of antagonists 2x10 second hold for reciprocal inhibition to decrease tension in adductors.  - Hooklying and seated left tibiofibular joint mobilization grade II-IV, AP, PA and gentle rotational each side, distraction.   Access Code: [email or web address]  URL: [email or web address]  Date: 07/16/2018  Prepared by: Rosita Kea   Exercises   Supine Active Straight Leg Raise - 10-15 reps - 1 second hold - 3 Sets - 1x daily - 7x weekly   Supine Bridge - 10-15 reps - 1 second hold - 3 Sets - 1x daily - 7x  weekly   Hooklying Clamshell with Resistance - 10-15 reps - 1 second hold - 3 Sets - 1x daily - 7x weekly      PT Education - 08/12/18 1402    Education Details  Exercise purpose/form. Self management techniques. Education on diagnosis, prognosis, POC, anatomy and physiology of current condition    Person(s) Educated  Patient    Methods  Explanation;Demonstration;Tactile cues;Verbal cues    Comprehension  Verbalized understanding;Returned demonstration       PT Short Term Goals - 07/29/18 1805      PT SHORT TERM GOAL #1   Title  Be independent with initial home exercise program for self-management of symptoms.    Baseline  Initial HEP provided at IE (07/16/2018);     Time  2  Period  Weeks    Status  Achieved    Target Date  07/31/18        PT Long Term Goals - 07/17/18 1206      PT LONG TERM GOAL #1   Title  Be independent with a long-term home exercise program for self-management of symptoms.     Baseline  Initial HEP provided at IE (07/16/2018);     Time  6    Period  Weeks    Status  New    Target Date  08/28/18      PT LONG TERM GOAL #2   Title  Demonstrate improved FOTO score by 10 units to demonstrate improvement in overall condition and self-reported functional ability.     Baseline  FOTO = 38 (07/17/2018);     Time  6    Period  Weeks    Status  New    Target Date  08/28/18      PT LONG TERM GOAL #3   Title  Patient will demonstrate left knee strength 5/5 to demonstrate functional strength for improved pain functional mobility.      Baseline  see objective exam    Time  6    Period  Weeks    Status  New    Target Date  08/28/18      PT LONG TERM GOAL #4   Title  Improve left knee  AROM by/to equal or greater than 125 degrees flexion to allow patient to complete valued activities with less difficulty.     Baseline  110 degrees (07/16/2018);     Time  6    Period  Weeks    Status  New    Target Date  08/28/18      PT LONG TERM GOAL #5   Title  Complete  community, work and/or recreational activities without limitation due to current condition.     Baseline  difficulty with prolonged positions, walking, sleeping, stairs, bending, squatting (07/16/2018);     Time  6    Period  Days    Status  New    Target Date  08/28/18            Plan - 08/12/18 1409    Clinical Impression Statement  Pt tolerated treatment fair. Repeated motions for the lumbar spine appeared to have no significant affect on her pain, decreasing the chance that her thigh pain is radiating from the lumbar spine. She is noted for tight left adductors and medial hamstrings that relaxed slightly with STM to this region. She also reported reductions in pain with joint mobilization and was pain free following in unloaded position. She was able to return to some quad exercises but still felt some pain at the posterior and medial knee with ankle weights applied, so they were removed from left leg. Stated she felt much better at end of session than when she arrived. Exercises were slightly progressed last treatment session and standing hamstring curls were added that may have contributed to irritation or DOMS in the left hamstring. Educated pt on appropriate response to exercise and when it I okay to continue and when pain may suggest she should stop. She demonstrated a set-back today in her progress but her good response in clinic today continues to show benefits from physical therapy.  Pt required multimodal cuing for proper technique and to facilitate improved neuromuscular control, strength, range of motion, and functional ability resulting in improved performance and form.Patient will benefit from skilled  physical therapy intervention to address current body structure impairments and activity limitations to improve function and work towards goals set in current POC in order to return to prior level of function or maximal functional improvement    Rehab Potential  Good    Clinical  Impairments Affecting Rehab Potential  (+) motivation, experience recovering from funcitonal disability; (-) pre-existing motor and sensory loss, comorbidities, fear of meaning of pain.     PT Frequency  2x / week    PT Duration  6 weeks    PT Treatment/Interventions  ADLs/Self Care Home Management;Aquatic Therapy;Cryotherapy;Moist Heat;Gait training;Stair training;Functional mobility training;Therapeutic activities;Therapeutic exercise;Balance training;Neuromuscular re-education;Patient/family education;Manual techniques;Compression bandaging;Passive range of motion;Dry needling;Taping;Spinal Manipulations;Joint Manipulations;Other (comment)   joint mobilizations grades I-IV   PT Next Visit Plan  Progresive LE strengthening as tolerated.     PT Home Exercise Plan  Medbridge Access Code: [email or web address]     Consulted and Agree with Plan of Care  Patient       Patient will benefit from skilled therapeutic intervention in order to improve the following deficits and impairments:  Abnormal gait, Decreased balance, Decreased endurance, Decreased mobility, Difficulty walking, Hypomobility, Increased muscle spasms, Impaired sensation, Other (comment), Decreased range of motion, Increased edema, Impaired perceived functional ability, Obesity, Decreased activity tolerance, Decreased coordination, Decreased strength, Hypermobility, Impaired flexibility, Pain, Postural dysfunction(incomplete knowledge of appropriate self-management techniques for current problem)  Visit Diagnosis: Left knee pain, unspecified chronicity  Muscle weakness (generalized)  Stiffness of left knee, not elsewhere classified  Difficulty in walking, not elsewhere classified     Problem List Patient Active Problem List   Diagnosis Date Noted  . Colon cancer screening 01/25/2018  . Hyperparathyroidism, primary ([place]) 03/11/2017  . 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 History of ankle fracture 11/04/2016  . Health care maintenance  08/22/2015  . Hyperglycemia 08/10/2015  . Hot flashes 02/14/2015  . Skin lesion of left arm 02/14/2015  . Gait disturbance 07/11/2014  . Right knee pain 12/29/2013  . Essential hypertension, benign 02/08/2013  . History of seizure disorder 09/28/2012  . History of brain tumor 09/11/2012    Nancy Nordmann, PT, DPT 08/12/2018, 2:10 PM  [facility] [address] [place], [address] Phone: [phone]   Fax:  [phone]  Name: Rachael Zimmerman MRN: [id number] Date of Birth: [birth year]

## 2018-08-13 ENCOUNTER — Other Ambulatory Visit: Payer: Self-pay | Admitting: Family Medicine

## 2018-08-13 DIAGNOSIS — M25562 Pain in left knee: Secondary | ICD-10-CM

## 2018-08-13 NOTE — Telephone Encounter (Signed)
Received request for mobic refill.  It appears she was given this for acute knee pain.  Prescribed by Ander Purpura.  Was referred to therapy.  Is she still having pain?  Does she still need?  If persistent problems with knee - needs ortho referral.

## 2018-08-13 NOTE — Telephone Encounter (Signed)
Last OV 06/12/2018 with lauren guse  Last refilled 07/07/2018 disp 30 with no refills   Sent to PCP for approval

## 2018-08-14 ENCOUNTER — Ambulatory Visit: Payer: Medicare Other | Admitting: Physical Therapy

## 2018-08-14 ENCOUNTER — Other Ambulatory Visit: Payer: Self-pay | Admitting: Internal Medicine

## 2018-08-14 DIAGNOSIS — R262 Difficulty in walking, not elsewhere classified: Secondary | ICD-10-CM

## 2018-08-14 DIAGNOSIS — M6281 Muscle weakness (generalized): Secondary | ICD-10-CM

## 2018-08-14 DIAGNOSIS — M25562 Pain in left knee: Secondary | ICD-10-CM | POA: Diagnosis not present

## 2018-08-14 DIAGNOSIS — M25662 Stiffness of left knee, not elsewhere classified: Secondary | ICD-10-CM | POA: Diagnosis not present

## 2018-08-14 NOTE — Therapy (Signed)
[facility] [address], [place], [address] Phone: [phone]   Fax:  [phone]  Physical Therapy Treatment  Patient Details  Name: Rachael Zimmerman MRN: [id number] Date of Birth: Mar 04, 1961 Referring Provider (PT): Jodelle Green, FNP   Encounter Date: 08/14/2018  PT End of Session - 08/14/18 0920    Visit Number  6    Number of Visits  12    Date for PT Re-Evaluation  08/28/18    Authorization Type  [employer] Medicare reporting period from 07/16/2018    Authorization Time Period  Current cert period: 11/17/6293 - 08/28/2018 (latest PN: [facility] 07/16/2018)    Authorization - Visit Number  6    Authorization - Number of Visits  10    PT Start Time  0915    PT Stop Time  0945    PT Time Calculation (min)  30 min    Activity Tolerance  Patient limited by pain;Patient tolerated treatment well    Behavior During Therapy  [facility] for tasks assessed/performed       Past Medical History:  Diagnosis Date  . 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[place])    s/p craniectomy  . Hypertension   . Pre-diabetes    patient denies ; see lab result for a1c 03-06-17 is 6.0  . Seizures ([place])    AFTER BRAIN SURGERY; none recently   . Weakness of right side of body    d/t brain tumor intervention     Past Surgical History:  Procedure Laterality Date  . ABDOMINAL HYSTERECTOMY  2010   ovaries not removed  . BRAIN SURGERY     tumor excision s/p craniectomy  . BREAST BIOPSY Left 2010   benign  . CATARACT EXTRACTION W/PHACO Left 11/07/2016   Procedure: CATARACT EXTRACTION PHACO AND INTRAOCULAR LENS PLACEMENT (IOC);  Surgeon: Estill Cotta, MD;  Location: [facility] ORS;  Service: Ophthalmology;  Laterality: Left;  [place] 00:53.9 AP% 16.7 CDE 17.02 Fluid Pack Lot # [id number]  . PARATHYROIDECTOMY Right 03/14/2017   Procedure: RIGHT PARATHYROIDECTOMY;  Surgeon: Armandina Gemma, MD;  Location: [facility] ORS;  Service: General;  Laterality: Right;    There were no vitals filed  for this visit.  Subjective Assessment - 08/14/18 0918    Subjective  Patient reports her pain is better. She has "mild pain" 3/10 in the left knee today. She states she had no exessive pain or soreness following last treatment session.  She states she has been able to return to her HEP without increased pain.     Pertinent History  Patient is a 58 y.o. female who presents to outpatient physical therapy with a referral for medical diagnosis acute pain of left knee. This patient's chief complaints consist of left knee pain, swelling, increased medication use to deal with pain, decreased activity tolerance, stiffness leading to the following functional deficits: difficulty with prolonged positions, walking, sleeping, stairs, bending, squatting, sitting.  Relevant past medical history and comorbidities include in 2003 she was diagnosed with a benign  brain tumor and since then she has had surgery for that. 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 After prolonged therapy she is able to walk and move again. She has a limp that is more noticeable when she is tired. 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 She doesn't have a lot of feeling in the right foot down past the ankle. [place] once in the last 6 months (due to rug). Hx of R ankle fracture, history  of seizure disorder (last was when she was 2013), hyperparathyroidism and parathyroid adenoma (parathyroidectomy in 2018 recent follow up every thing looks fine). Notes popping and cracking but not locking.     Diagnostic tests  Imaging: "IMPRESSION: Minimal degenerative joint disease is noted medially. No acute abnormality seen in the left knee."    Currently in Pain?  Yes    Pain Score  3     Pain Location  Knee    Pain Orientation  Left    Pain Onset  More than a month ago    Pain Frequency  Intermittent      TREATMENT: Denies sensitivity to latex Denies history of long term steroid medications Denies any history of spine or knee surgery.    Therapeutic exercise:to centralize symptoms and improve ROM, strength, muscular endurance, and activity tolerance required for successful completion of functional activities.  -NuStep level5using bilateral upper and lower extremities. Seat setting6, handle setting7. For improved extremity mobility, muscular endurance, and activity tolerance; and to induce the analgesic effect of aerobic exercise, stimulate improved joint nutrition, and prepare body structures and systems for following interventions. X 5 minutes. Required assistance to get on and off and set up machine. - supine short arc quad to improve quad strength and knee stability. Cuing for quad contraction and complete ROM. 3x10 each side at7.5# ankle weights. - seated longarc quad to improve quad strength and knee stability. Cuing for quad contraction and complete ROM. Mild left hip IR to find more comfortable position. 3x10 each side at 7.5# ankle weights. - standing hip abduction at hip machine, 2x10 each side. 25#  Cuing for form. Facing outwards to prevent compensatory hip flexion. - standing heel raises on 20 degree slant board, 2x15. To improve gastroc strength and range of motion.BUE support. Cuing to keep hips forward.  - Education on diagnosis, prognosis, POC, anatomy and physiology of current condition.    Manual therapy:to reduce pain and tissue tension, improve range of motion, neuromodulation, in order to promote improved ability to complete functional activities. - STM to left adductors and medial hamstrings to reduce tension and pain  - Hooklying left tibiofibular joint mobilization grade II-IV, AP, PA and gentle rotational each side, distraction.     Access Code: [email or web address]  URL: [email or web address]  Date: 07/16/2018  Prepared by: Rosita Kea   Exercises   Supine Active Straight Leg Raise - 10-15 reps - 1 second hold - 3 Sets - 1x daily - 7x weekly   Supine Bridge - 10-15 reps - 1 second  hold - 3 Sets - 1x daily - 7x weekly   Hooklying Clamshell with Resistance - 10-15 reps - 1 second hold - 3 Sets - 1x daily - 7x weekly      PT Education - 08/14/18 0920    Education Details  Exercise purpose/form. Self management techniques. Education on diagnosis, prognosis, POC, anatomy and physiology of current condition    Person(s) Educated  Patient    Methods  Explanation;Demonstration;Tactile cues;Verbal cues    Comprehension  Verbalized understanding;Returned demonstration       PT Short Term Goals - 07/29/18 1805      PT SHORT TERM GOAL #1   Title  Be independent with initial home exercise program for self-management of symptoms.    Baseline  Initial HEP provided at IE (07/16/2018);     Time  2    Period  Weeks    Status  Achieved    Target Date  07/31/18  PT Long Term Goals - 07/17/18 1206      PT LONG TERM GOAL #1   Title  Be independent with a long-term home exercise program for self-management of symptoms.     Baseline  Initial HEP provided at [facility] (07/16/2018);     Time  6    Period  Weeks    Status  New    Target Date  08/28/18      PT LONG TERM GOAL #2   Title  Demonstrate improved FOTO score by 10 units to demonstrate improvement in overall condition and self-reported functional ability.     Baseline  FOTO = 38 (07/17/2018);     Time  6    Period  Weeks    Status  New    Target Date  08/28/18      PT LONG TERM GOAL #3   Title  Patient will demonstrate left knee strength 5/5 to demonstrate functional strength for improved pain functional mobility.      Baseline  see objective exam    Time  6    Period  Weeks    Status  New    Target Date  08/28/18      PT LONG TERM GOAL #4   Title  Improve left knee  AROM by/to equal or greater than 125 degrees flexion to allow patient to complete valued activities with less difficulty.     Baseline  110 degrees (07/16/2018);     Time  6    Period  Weeks    Status  New    Target Date  08/28/18      PT LONG  TERM GOAL #5   Title  Complete community, work and/or recreational activities without limitation due to current condition.     Baseline  difficulty with prolonged positions, walking, sleeping, stairs, bending, squatting (07/16/2018);     Time  6    Period  Days    Status  New    Target Date  08/28/18            Plan - 08/14/18 0929    Clinical Impression Statement  Pt tolerated treatment well. She had decreased pain upon arrival. Based on response to last few visits, likely progressed too quickly, especially with hamstring curls that aggravated her symptoms and she has been able to return to tolerating progressive strengthening at a slower progression rate this visit.  She is noted for tight left adductors and medial hamstrings that relaxed slightly with STM to this region. She responds well to joint mobilizations with decreased pain following.  Pt required multimodal cuing for proper technique and to facilitate improved neuromuscular control, strength, range of motion, and functional ability resulting in improved performance and form.Patient will benefit from skilled physical therapy intervention to address current body structure impairments and activity limitations to improve function and work towards goals set in current POC in order to return to prior level of function or maximal functional improvemen    Rehab Potential  Good    Clinical Impairments Affecting Rehab Potential  (+) motivation, experience recovering from funcitonal disability; (-) pre-existing motor and sensory loss, comorbidities, fear of meaning of pain.     PT Frequency  2x / week    PT Duration  6 weeks    PT Treatment/Interventions  ADLs/Self Care Home Management;Aquatic Therapy;Cryotherapy;Moist Heat;Gait training;Stair training;Functional mobility training;Therapeutic activities;Therapeutic exercise;Balance training;Neuromuscular re-education;Patient/family education;Manual techniques;Compression bandaging;Passive range  of motion;Dry needling;Taping;Spinal Manipulations;Joint Manipulations;Other (comment)   joint mobilizations grades I-IV  PT Next Visit Plan  Progresive LE strengthening as tolerated.     PT Home Exercise Plan  Medbridge Access Code: [email or web address]     Consulted and Agree with Plan of Care  Patient       Patient will benefit from skilled therapeutic intervention in order to improve the following deficits and impairments:  Abnormal gait, Decreased balance, Decreased endurance, Decreased mobility, Difficulty walking, Hypomobility, Increased muscle spasms, Impaired sensation, Other (comment), Decreased range of motion, Increased edema, Impaired perceived functional ability, Obesity, Decreased activity tolerance, Decreased coordination, Decreased strength, Hypermobility, Impaired flexibility, Pain, Postural dysfunction(incomplete knowledge of appropriate self-management techniques for current problem)  Visit Diagnosis: Left knee pain, unspecified chronicity  Muscle weakness (generalized)  Stiffness of left knee, not elsewhere classified  Difficulty in walking, not elsewhere classified     Problem List Patient Active Problem List   Diagnosis Date Noted  . Colon cancer screening 01/25/2018  . Hyperparathyroidism, primary ([place]) 03/11/2017  . 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 History of ankle fracture 11/04/2016  . Health care maintenance 08/22/2015  . Hyperglycemia 08/10/2015  . Hot flashes 02/14/2015  . Skin lesion of left arm 02/14/2015  . Gait disturbance 07/11/2014  . Right knee pain 12/29/2013  . Essential hypertension, benign 02/08/2013  . History of seizure disorder 09/28/2012  . History of brain tumor 09/11/2012    Rachael Zimmerman 08/14/2018, 9:44 AM  [facility] [address], [place], [address] Phone: [phone]   Fax:  [phone]  Name: Rachael Zimmerman MRN: [id number] Date of Birth: [birth year]

## 2018-08-14 NOTE — Telephone Encounter (Signed)
LMTCB

## 2018-08-19 ENCOUNTER — Telehealth: Payer: Self-pay | Admitting: Physical Therapy

## 2018-08-19 ENCOUNTER — Ambulatory Visit: Payer: Medicare Other | Admitting: Physical Therapy

## 2018-08-19 NOTE — Telephone Encounter (Signed)
Called pt after she did not show up for her appt today at 11:15am. Informed her of the missed visit and of her next scheduled visit thurs 08/21/2018 at 10:30am. Requested a fall back at [phone] to let us know she plans to attend and is okay.

## 2018-08-21 ENCOUNTER — Other Ambulatory Visit: Payer: Self-pay

## 2018-08-21 ENCOUNTER — Encounter: Payer: Self-pay | Admitting: Physical Therapy

## 2018-08-21 ENCOUNTER — Ambulatory Visit: Payer: Medicare Other | Admitting: Physical Therapy

## 2018-08-21 DIAGNOSIS — R262 Difficulty in walking, not elsewhere classified: Secondary | ICD-10-CM | POA: Diagnosis not present

## 2018-08-21 DIAGNOSIS — M25662 Stiffness of left knee, not elsewhere classified: Secondary | ICD-10-CM | POA: Diagnosis not present

## 2018-08-21 DIAGNOSIS — M25562 Pain in left knee: Secondary | ICD-10-CM | POA: Diagnosis not present

## 2018-08-21 DIAGNOSIS — M6281 Muscle weakness (generalized): Secondary | ICD-10-CM

## 2018-08-21 NOTE — Therapy (Signed)
[facility] [address] [place], [address] Phone: [phone]   Fax:  [phone]  Physical Therapy Treatment  Patient Details  Name: Rachael Zimmerman MRN: [id number] Date of Birth: 08-07-1960 Referring Provider (PT): Jodelle Green, FNP   Encounter Date: 08/21/2018  PT End of Session - 08/21/18 1045    Visit Number  7    Number of Visits  12    Date for PT Re-Evaluation  08/28/18    Authorization Type  [employer] Medicare reporting period from 07/16/2018    Authorization Time Period  Current cert period: 09/11/3534 - 08/28/2018 (latest PN: [facility] 07/16/2018)    Authorization - Visit Number  7    Authorization - Number of Visits  10    PT Start Time  1038    PT Stop Time  1120    PT Time Calculation (min)  42 min    Activity Tolerance  Patient limited by pain;Patient tolerated treatment well    Behavior During Therapy  [facility] for tasks assessed/performed       Past Medical History:  Diagnosis Date  . 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[place])    s/p craniectomy  . Hypertension   . Pre-diabetes    patient denies ; see lab result for a1c 03-06-17 is 6.0  . Seizures ([place])    AFTER BRAIN SURGERY; none recently   . Weakness of right side of body    d/t brain tumor intervention     Past Surgical History:  Procedure Laterality Date  . ABDOMINAL HYSTERECTOMY  2010   ovaries not removed  . BRAIN SURGERY     tumor excision s/p craniectomy  . BREAST BIOPSY Left 2010   benign  . CATARACT EXTRACTION W/PHACO Left 11/07/2016   Procedure: CATARACT EXTRACTION PHACO AND INTRAOCULAR LENS PLACEMENT (IOC);  Surgeon: Estill Cotta, MD;  Location: [facility] ORS;  Service: Ophthalmology;  Laterality: Left;  [place] 00:53.9 AP% 16.7 CDE 17.02 Fluid Pack Lot # [id number]  . PARATHYROIDECTOMY Right 03/14/2017   Procedure: RIGHT PARATHYROIDECTOMY;  Surgeon: Armandina Gemma, MD;  Location: [facility];  Service: General;  Laterality: Right;    There were no vitals  filed for this visit.  Subjective Assessment - 08/21/18 1040    Subjective  Patient reports she had an episode of vertigo and some drainage earlier in the week and she was unable to drive to her last scheduled PT appt.  She is feeling better now but has some slight voice changed due to post nasal drainage that is improving. She reports her knee pain is about 3-4/10 upon arrival. She has not done any of her HEP but did try some walking yesterday and after sitting she had a lot of pain after sitting for a while.  She reports no excessive soreness or pain following last treatment session.     Pertinent History  Patient is a 58 y.o. female who presents to outpatient physical therapy with a referral for medical diagnosis acute pain of left knee. This patient's chief complaints consist of left knee pain, swelling, increased medication use to deal with pain, decreased activity tolerance, stiffness leading to the following functional deficits: difficulty with prolonged positions, walking, sleeping, stairs, bending, squatting, sitting.  Relevant past medical history and comorbidities include in 2003 she was diagnosed with a benign  brain tumor and since then she has had surgery for that. 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 After prolonged therapy she is able to walk and  move again. She has a limp that is more noticeable when she is tired. 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 She doesn't have a lot of feeling in the right foot down past the ankle. [place] once in the last 6 months (due to rug). Hx of R ankle fracture, history of seizure disorder (last was when she was 2013), hyperparathyroidism and parathyroid adenoma (parathyroidectomy in 2018 recent follow up every thing looks fine). Notes popping and cracking but not locking.     Diagnostic tests  Imaging: "IMPRESSION: Minimal degenerative joint disease is noted medially. No acute abnormality seen in the left knee."    Currently in Pain?  Yes    Pain  Score  3     Pain Location  Knee    Pain Orientation  Left    Pain Onset  More than a month ago    Pain Frequency  Intermittent       TREATMENT: Denies sensitivity to latex Denies history of long term steroid medications Denies any history of spine or knee surgery.   Therapeutic exercise:to centralize symptoms and improve ROM, strength, muscular endurance, and activity tolerance required for successful completion of functional activities.  -NuStep level5using bilateral upper and lower extremities. Seat setting6, handle setting7. For improved extremity mobility, muscular endurance, and activity tolerance; and to induce the analgesic effect of aerobic exercise, stimulate improved joint nutrition, and prepare body structures and systems for following interventions. X5 minutes.Required assistance to get on and off and set up machine. - supine short arc quad to improve quad strength and knee stability. Cuing for quad contraction and complete ROM. 3x10 each side at7.5# ankle weights. - seated longarc quad to improve quad strength and knee stability. Cuing for quad contraction and complete ROM. Mild left hip IR to find more comfortable position. Added ball between knees for isometric adduction to improve comfort. 3x10 each side at7.5# ankle weights. - standing hip abduction at hip machine,2x10 each side. 25# Cuing for form. Facing outwards to prevent compensatory hip flexion. - standing heel raises on 20 degree slant board,2x15. To improve gastroc strength and range of motion.BUE support. Cuing to keep hips forward. - Education on diagnosis, prognosis, POC, anatomy and physiology of current condition.   Manual therapy:to reduce pain and tissue tension, improve range of motion, neuromodulation, in order to promote improved ability to complete functional activities. - STM to leftadductorsand medialhamstrings to reduce tension and pain -Hooklyingleft tibiofibular joint  mobilization grade II-IV, AP, PA and gentle rotational each side, distraction.  - patellar mobilizations grade II-IV all directions on left knee to decrease pain.    Access Code: [email or web address]  URL: [email or web address]  Date: 07/16/2018  Prepared by: Rosita Kea   Exercises   Supine Active Straight Leg Raise - 10-15 reps - 1 second hold - 3 Sets - 1x daily - 7x weekly   Supine Bridge - 10-15 reps - 1 second hold - 3 Sets - 1x daily - 7x weekly   Hooklying Clamshell with Resistance - 10-15 reps - 1 second hold - 3 Sets - 1x daily - 7x weekly    PT Education - 08/21/18 1044    Education Details  Exercise purpose/form. Self management techniques. Education on diagnosis, prognosis, POC, anatomy and physiology of current condition    Person(s) Educated  Patient    Methods  Explanation;Demonstration;Tactile cues;Verbal cues    Comprehension  Verbalized understanding;Returned demonstration       PT Short Term Goals - 07/29/18 1805  PT SHORT TERM GOAL #1   Title  Be independent with initial home exercise program for self-management of symptoms.    Baseline  Initial HEP provided at IE (07/16/2018);     Time  2    Period  Weeks    Status  Achieved    Target Date  07/31/18        PT Long Term Goals - 07/17/18 1206      PT LONG TERM GOAL #1   Title  Be independent with a long-term home exercise program for self-management of symptoms.     Baseline  Initial HEP provided at IE (07/16/2018);     Time  6    Period  Weeks    Status  New    Target Date  08/28/18      PT LONG TERM GOAL #2   Title  Demonstrate improved FOTO score by 10 units to demonstrate improvement in overall condition and self-reported functional ability.     Baseline  FOTO = 38 (07/17/2018);     Time  6    Period  Weeks    Status  New    Target Date  08/28/18      PT LONG TERM GOAL #3   Title  Patient will demonstrate left knee strength 5/5 to demonstrate functional strength for improved pain  functional mobility.      Baseline  see objective exam    Time  6    Period  Weeks    Status  New    Target Date  08/28/18      PT LONG TERM GOAL #4   Title  Improve left knee  AROM by/to equal or greater than 125 degrees flexion to allow patient to complete valued activities with less difficulty.     Baseline  110 degrees (07/16/2018);     Time  6    Period  Weeks    Status  New    Target Date  08/28/18      PT LONG TERM GOAL #5   Title  Complete community, work and/or recreational activities without limitation due to current condition.     Baseline  difficulty with prolonged positions, walking, sleeping, stairs, bending, squatting (07/16/2018);     Time  6    Period  Days    Status  New    Target Date  08/28/18            Plan - 08/21/18 1127    Clinical Impression Statement  Pt tolerated treatment well. She missed her appt earlier in the week due to illness and has also had a lapse in performing her HEP. No significant progress noted since last treatment session, likely related to these interruptions in care. She continues to have point tenderness at medial joint line and tolerates knee extension better with tibia slightly internally rotated. She had some pain with long arc quad that was improved with addition of isometric hip adduction during exercise. She had pain reduction following manual and reported overall improvement in her pain by end of session. Exercises were not significantly progressed due to lack of consistency in attendance and HEP since last session.Pt required multimodal cuing for proper technique and to facilitate improved neuromuscular control, strength, range of motion, and functional ability resulting in improved performance and form.Patient will benefit from skilled physical therapy intervention to address current body structure impairments and activity limitations to improve function and work towards goals set in current POC in order to return to  prior level of  function or maximal functional improvement    Rehab Potential  Good    Clinical Impairments Affecting Rehab Potential  (+) motivation, experience recovering from funcitonal disability; (-) pre-existing motor and sensory loss, comorbidities, fear of meaning of pain.     PT Frequency  2x / week    PT Duration  6 weeks    PT Treatment/Interventions  ADLs/Self Care Home Management;Aquatic Therapy;Cryotherapy;Moist Heat;Gait training;Stair training;Functional mobility training;Therapeutic activities;Therapeutic exercise;Balance training;Neuromuscular re-education;Patient/family education;Manual techniques;Compression bandaging;Passive range of motion;Dry needling;Taping;Spinal Manipulations;Joint Manipulations;Other (comment)   joint mobilizations grades I-IV   PT Next Visit Plan  Progresive LE strengthening as tolerated.     PT Home Exercise Plan  Medbridge Access Code: [email or web address]     Consulted and Agree with Plan of Care  Patient       Patient will benefit from skilled therapeutic intervention in order to improve the following deficits and impairments:  Abnormal gait, Decreased balance, Decreased endurance, Decreased mobility, Difficulty walking, Hypomobility, Increased muscle spasms, Impaired sensation, Other (comment), Decreased range of motion, Increased edema, Impaired perceived functional ability, Obesity, Decreased activity tolerance, Decreased coordination, Decreased strength, Hypermobility, Impaired flexibility, Pain, Postural dysfunction(incomplete knowledge of appropriate self-management techniques for current problem)  Visit Diagnosis: Left knee pain, unspecified chronicity  Muscle weakness (generalized)  Stiffness of left knee, not elsewhere classified  Difficulty in walking, not elsewhere classified     Problem List Patient Active Problem List   Diagnosis Date Noted  . Colon cancer screening 01/25/2018  . Hyperparathyroidism, primary ([place]) 03/11/2017  . Parathyroid adenoma  12/04/2016  . History of ankle fracture 11/04/2016  . Health care maintenance 08/22/2015  . Hyperglycemia 08/10/2015  . Hot flashes 02/14/2015  . Skin lesion of left arm 02/14/2015  . Gait disturbance 07/11/2014  . Right knee pain 12/29/2013  . Essential hypertension, benign 02/08/2013  . History of seizure disorder 09/28/2012  . History of brain tumor 09/11/2012    Nancy Nordmann, PT, DPT 08/21/2018, 11:27 AM  [facility] [address] [place], [address] Phone: [phone]   Fax:  [phone]  Name: ORETTA BERKLAND MRN: [id number] Date of Birth: 01/16/1961

## 2018-08-25 ENCOUNTER — Ambulatory Visit: Payer: Medicare Other | Admitting: Physical Therapy

## 2018-08-25 ENCOUNTER — Other Ambulatory Visit: Payer: Self-pay

## 2018-08-25 DIAGNOSIS — M6281 Muscle weakness (generalized): Secondary | ICD-10-CM | POA: Diagnosis not present

## 2018-08-25 DIAGNOSIS — R262 Difficulty in walking, not elsewhere classified: Secondary | ICD-10-CM | POA: Diagnosis not present

## 2018-08-25 DIAGNOSIS — M25562 Pain in left knee: Secondary | ICD-10-CM | POA: Diagnosis not present

## 2018-08-25 DIAGNOSIS — M25662 Stiffness of left knee, not elsewhere classified: Secondary | ICD-10-CM

## 2018-08-25 NOTE — Therapy (Signed)
[facility] [address] [place], [address] Phone: [phone]   Fax:  [phone]  Physical Therapy Treatment  Patient Details  Name: Rachael Zimmerman MRN: [id number] Date of Birth: Sep 26, 1960 Referring Provider (PT): Jodelle Green, FNP   Encounter Date: 08/25/2018  PT End of Session - 08/26/18 0831    Visit Number  8    Number of Visits  12    Date for PT Re-Evaluation  08/28/18    Authorization Type  [employer] Medicare reporting period from 07/16/2018    Authorization Time Period  Current cert period: 8/0/9983 - 08/28/2018 (latest PN: IE 07/16/2018)    Authorization - Visit Number  8    Authorization - Number of Visits  10    PT Start Time  1440    PT Stop Time  1520    PT Time Calculation (min)  40 min    Activity Tolerance  Patient limited by pain;Patient tolerated treatment well    Behavior During Therapy  [facility] for tasks assessed/performed       Past Medical History:  Diagnosis Date  . 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[place])    s/p craniectomy  . Hypertension   . Pre-diabetes    patient denies ; see lab result for a1c 03-06-17 is 6.0  . Seizures ([place])    AFTER BRAIN SURGERY; none recently   . Weakness of right side of body    d/t brain tumor intervention     Past Surgical History:  Procedure Laterality Date  . ABDOMINAL HYSTERECTOMY  2010   ovaries not removed  . BRAIN SURGERY     tumor excision s/p craniectomy  . BREAST BIOPSY Left 2010   benign  . CATARACT EXTRACTION W/PHACO Left 11/07/2016   Procedure: CATARACT EXTRACTION PHACO AND INTRAOCULAR LENS PLACEMENT (IOC);  Surgeon: Estill Cotta, MD;  Location: [facility] ORS;  Service: Ophthalmology;  Laterality: Left;  [place] 00:53.9 AP% 16.7 CDE 17.02 Fluid Pack Lot # [id number] H  . PARATHYROIDECTOMY Right 03/14/2017   Procedure: RIGHT PARATHYROIDECTOMY;  Surgeon: Armandina Gemma, MD;  Location: [facility];  Service: General;  Laterality: Right;    There were no vitals  filed for this visit.  Subjective Assessment - 08/25/18 1522    Subjective  Patient reports she continues to have intermittant pain up to 5/10 upon arrival. She reports she is having a lot of business and stress at work with all of the children being out of school this week. she plans to visit her referring doctor next week.     Pertinent History  Patient is a 58 y.o. female who presents to outpatient physical therapy with a referral for medical diagnosis acute pain of left knee. This patient's chief complaints consist of left knee pain, swelling, increased medication use to deal with pain, decreased activity tolerance, stiffness leading to the following functional deficits: difficulty with prolonged positions, walking, sleeping, stairs, bending, squatting, sitting.  Relevant past medical history and comorbidities include in 2003 she was diagnosed with a benign  brain tumor and since then she has had surgery for that. 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 After prolonged therapy she is able to walk and move again. She has a limp that is more noticeable when she is tired. 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 She doesn't have a lot of feeling in the right foot down past the ankle. [place] once in the last 6 months (due to rug). Hx of R ankle  fracture, history of seizure disorder (last was when she was 2013), hyperparathyroidism and parathyroid adenoma (parathyroidectomy in 2018 recent follow up every thing looks fine). Notes popping and cracking but not locking.     Diagnostic tests  Imaging: "IMPRESSION: Minimal degenerative joint disease is noted medially. No acute abnormality seen in the left knee."    Currently in Pain?  Other (Comment)   intermittant pain to 6/10 at medial left knee   Pain Onset  More than a month ago       TREATMENT: Denies sensitivity to latex Denies history of long term steroid medications Denies any history of spine or knee surgery.   Therapeutic exercise:to  centralize symptoms and improve ROM, strength, muscular endurance, and activity tolerance required for successful completion of functional activities.   - supine short arc quad to improve quad strength and knee stability. Cuing for quad contraction and complete ROM. 3x10 each side at7.5#  R and 10# L ankle weights. - seated longarc quad to improve quad strength and knee stability. Cuing for quad contraction and complete ROM. Mild left hip IR to find more comfortable position. Added ball between knees for isometric adduction to improve comfort. 3x10 each side at7.5# R and 10# L ankle weights. - hooklying bridges x30 for isometric hamstring strengthening and hip strength. Cuing for technique.  - standing hip abduction at hip machine,2x10 each side. 40# Cuing for form. Facing outwards to prevent compensatory hip flexion. - Step ups to 6" step, x10 each side without UE support concentrating on improving functional strength, activity tolerance, and control.  - ambulation attempting to keep right knee from hyperextension. Pt able to support weight but unable to feel this position or produce it independently without being verbally cued to "walk in a crouch." To improve gait pattern for less stress on sore knee.  - Education on diagnosis, prognosis, POC, anatomy and physiology of current condition.  Manual therapy:to reduce pain and tissue tension, improve range of motion, neuromodulation, in order to promote improved ability to complete functional activities. - STM to leftadductorsand medialhamstrings to reduce tension and pain -Hooklyingleft tibiofibular joint mobilization grade II-IV, AP, PA and gentle rotational each side, distraction.    Access Code: [email or web address]  URL: [email or web address]  Date: 07/16/2018  Prepared by: Rosita Kea   Exercises   Supine Active Straight Leg Raise - 10-15 reps - 1 second hold - 3 Sets - 1x daily - 7x weekly   Supine Bridge - 10-15 reps  - 1 second hold - 3 Sets - 1x daily - 7x weekly   Hooklying Clamshell with Resistance - 10-15 reps - 1 second hold - 3 Sets - 1x daily - 7x weekly    PT Education - 08/26/18 0831    Education Details  Exercise purpose/form. Self management techniques. Education on diagnosis, prognosis, POC, anatomy and physiology of current condition    Person(s) Educated  Patient    Methods  Explanation;Demonstration;Tactile cues;Verbal cues    Comprehension  Verbalized understanding;Returned demonstration       PT Short Term Goals - 07/29/18 1805      PT SHORT TERM GOAL #1   Title  Be independent with initial home exercise program for self-management of symptoms.    Baseline  Initial HEP provided at IE (07/16/2018);     Time  2    Period  Weeks    Status  Achieved    Target Date  07/31/18        PT Long Term Goals - 07/17/18  [place] #1   Title  Be independent with a long-term home exercise program for self-management of symptoms.     Baseline  Initial HEP provided at IE (07/16/2018);     Time  6    Period  Weeks    Status  New    Target Date  08/28/18      PT LONG TERM GOAL #2   Title  Demonstrate improved FOTO score by 10 units to demonstrate improvement in overall condition and self-reported functional ability.     Baseline  FOTO = 38 (07/17/2018);     Time  6    Period  Weeks    Status  New    Target Date  08/28/18      PT LONG TERM GOAL #3   Title  Patient will demonstrate left knee strength 5/5 to demonstrate functional strength for improved pain functional mobility.      Baseline  see objective exam    Time  6    Period  Weeks    Status  New    Target Date  08/28/18      PT LONG TERM GOAL #4   Title  Improve left knee  AROM by/to equal or greater than 125 degrees flexion to allow patient to complete valued activities with less difficulty.     Baseline  110 degrees (07/16/2018);     Time  6    Period  Weeks    Status  New    Target Date  08/28/18       PT LONG TERM GOAL #5   Title  Complete community, work and/or recreational activities without limitation due to current condition.     Baseline  difficulty with prolonged positions, walking, sleeping, stairs, bending, squatting (07/16/2018);     Time  6    Period  Days    Status  New    Target Date  08/28/18            Plan - 08/26/18 0835    Clinical Impression Statement  Pt tolerated treatment well. She continues to have intermittent pain and responds well within session to treatment. She was able to progress to step ups today. Included work to normalize gait pattern today due to likelihood that her incorrect use of right LE is affecting forces at the left LE. Continues to report episodes of knee giving out. She continues to have point tenderness at medial joint line and tolerates knee extension better with tibia slightly internally rotated. She had some pain with long arc quad. She had pain reduction following manual and reported overall improvement in her pain by end of session. Pt required multimodal cuing for proper technique and to facilitate improved neuromuscular control, strength, range of motion, and functional ability resulting in improved performance and form.Patient will benefit from skilled physical therapy intervention to address current body structure impairments and activity limitations to improve function and work towards goals set in current POC in order to return to prior level of function or maximal functional improvement    Rehab Potential  Good    Clinical Impairments Affecting Rehab Potential  (+) motivation, experience recovering from funcitonal disability; (-) pre-existing motor and sensory loss, comorbidities, fear of meaning of pain.     PT Frequency  2x / week    PT Duration  6 weeks    PT Treatment/Interventions  ADLs/Self Care Home Management;Aquatic Therapy;Cryotherapy;Moist Heat;Gait training;Stair training;Functional mobility training;Therapeutic  activities;Therapeutic  exercise;Balance training;Neuromuscular re-education;Patient/family education;Manual techniques;Compression bandaging;Passive range of motion;Dry needling;Taping;Spinal Manipulations;Joint Manipulations;Other (comment)   joint mobilizations grades I-IV   PT Next Visit Plan  Progresive LE strengthening as tolerated.     PT Home Exercise Plan  Medbridge Access Code: [email or web address]     Consulted and Agree with Plan of Care  Patient       Patient will benefit from skilled therapeutic intervention in order to improve the following deficits and impairments:  Abnormal gait, Decreased balance, Decreased endurance, Decreased mobility, Difficulty walking, Hypomobility, Increased muscle spasms, Impaired sensation, Other (comment), Decreased range of motion, Increased edema, Impaired perceived functional ability, Obesity, Decreased activity tolerance, Decreased coordination, Decreased strength, Hypermobility, Impaired flexibility, Pain, Postural dysfunction(incomplete knowledge of appropriate self-management techniques for current problem)  Visit Diagnosis: Left knee pain, unspecified chronicity  Muscle weakness (generalized)  Stiffness of left knee, not elsewhere classified  Difficulty in walking, not elsewhere classified     Problem List Patient Active Problem List   Diagnosis Date Noted  . Colon cancer screening 01/25/2018  . Hyperparathyroidism, primary ([place]) 03/11/2017  . 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 History of ankle fracture 11/04/2016  . Health care maintenance 08/22/2015  . Hyperglycemia 08/10/2015  . Hot flashes 02/14/2015  . Skin lesion of left arm 02/14/2015  . Gait disturbance 07/11/2014  . Right knee pain 12/29/2013  . Essential hypertension, benign 02/08/2013  . History of seizure disorder 09/28/2012  . History of brain tumor 09/11/2012    Nancy Nordmann, PT, DPT 08/26/2018, 8:36 AM  [facility] [address], [place], [address] Phone: [phone]   Fax:  [phone]  Name: Rachael Zimmerman MRN: [id number] Date of Birth: 1960-06-29

## 2018-08-26 ENCOUNTER — Encounter: Payer: Self-pay | Admitting: Physical Therapy

## 2018-08-28 ENCOUNTER — Other Ambulatory Visit: Payer: Self-pay

## 2018-08-28 ENCOUNTER — Ambulatory Visit: Payer: Medicare Other | Admitting: Physical Therapy

## 2018-08-28 ENCOUNTER — Encounter: Payer: Self-pay | Admitting: Physical Therapy

## 2018-08-28 DIAGNOSIS — R262 Difficulty in walking, not elsewhere classified: Secondary | ICD-10-CM | POA: Diagnosis not present

## 2018-08-28 DIAGNOSIS — M25562 Pain in left knee: Secondary | ICD-10-CM | POA: Diagnosis not present

## 2018-08-28 DIAGNOSIS — M6281 Muscle weakness (generalized): Secondary | ICD-10-CM

## 2018-08-28 DIAGNOSIS — M25662 Stiffness of left knee, not elsewhere classified: Secondary | ICD-10-CM | POA: Diagnosis not present

## 2018-08-28 NOTE — Therapy (Signed)
[facility] [address] [place], [address] Phone: [phone]   Fax:  [phone]  Physical Therapy Treatment / Progress Note / Re-Certification Reporting period: 07/16/2018 - 08/28/2018  Patient Details  Name: Rachael Zimmerman MRN: [id number] Date of Birth: 02-27-1961 Referring Provider (PT): Jodelle Green, FNP   Encounter Date: 08/28/2018  PT End of Session - 08/28/18 1142    Visit Number  9    Number of Visits  12    Date for PT Re-Evaluation  08/28/18    Authorization Type  [employer] Medicare reporting period from 07/16/2018    Authorization Time Period  Current cert period: 11/11/7856 - 08/28/2018 (latest PN: [facility] 07/16/2018)    Authorization - Visit Number  9    Authorization - Number of Visits  10    PT Start Time  1140    PT Stop Time  1230    PT Time Calculation (min)  50 min    Activity Tolerance  Patient limited by pain    Behavior During Therapy  [facility] for tasks assessed/performed       Past Medical History:  Diagnosis Date  . 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[place])    s/p craniectomy  . Hypertension   . Pre-diabetes    patient denies ; see lab result for a1c 03-06-17 is 6.0  . Seizures ([place])    AFTER BRAIN SURGERY; none recently   . Weakness of right side of body    d/t brain tumor intervention     Past Surgical History:  Procedure Laterality Date  . ABDOMINAL HYSTERECTOMY  2010   ovaries not removed  . BRAIN SURGERY     tumor excision s/p craniectomy  . BREAST BIOPSY Left 2010   benign  . CATARACT EXTRACTION W/PHACO Left 11/07/2016   Procedure: CATARACT EXTRACTION PHACO AND INTRAOCULAR LENS PLACEMENT (IOC);  Surgeon: Estill Cotta, MD;  Location: [facility] ORS;  Service: Ophthalmology;  Laterality: Left;  [place] 00:53.9 AP% 16.7 CDE 17.02 Fluid Pack Lot # [id number]  . PARATHYROIDECTOMY Right 03/14/2017   Procedure: RIGHT PARATHYROIDECTOMY;  Surgeon: Armandina Gemma, MD;  Location: [facility] ORS;  Service: General;   Laterality: Right;    There were no vitals filed for this visit.  Subjective Assessment - 08/28/18 1144    Subjective  Patient reports she is feeling well. She continues to get shooitng pains in the left medial knee 5/10. She reports she felt okay folloiwng last treatment session with no excessive soreness or pain.      Pertinent History  Patient is a 58 y.o. female who presents to outpatient physical therapy with a referral for medical diagnosis acute pain of left knee. This patient's chief complaints consist of left knee pain, swelling, increased medication use to deal with pain, decreased activity tolerance, stiffness leading to the following functional deficits: difficulty with prolonged positions, walking, sleeping, stairs, bending, squatting, sitting.  Relevant past medical history and comorbidities include in 2003 she was diagnosed with a benign  brain tumor and since then she has had surgery for that. 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 After prolonged therapy she is able to walk and move again. She has a limp that is more noticeable when she is tired. 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 She doesn't have a lot of feeling in the right foot down past the ankle. [place] once in the last 6 months (due to rug). Hx of R ankle fracture, history of seizure disorder (  last was when she was 2013), hyperparathyroidism and parathyroid adenoma (parathyroidectomy in 2018 recent follow up every thing looks fine). Notes popping and cracking but not locking.     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 No acute abnormality seen in the left knee."    Currently in Pain?  Yes   intermittant pain to 6/10 at medial left knee   Pain Score  5     Pain Location  Knee    Pain Orientation  Left;Anterior;Medial    Pain Descriptors / Indicators  --   occasional sharp pain when she moves certain ways   Pain  Type  Chronic pain    Pain Onset  More than a month ago    Pain Frequency  Intermittent       OBJECTIVE: OBSERVATION/INSPECTION: Patient presents with significantly larger muscle mass on the left thigh and lower leg compared to right. Stands with right genurecurvatum.   NEUROLOGICAL: Dermatomes: BLE dermatomes equal and intact to light touch Myotomes: see below (history of brain tumor/surgery damaged right motor and sensory functions).   SPINE MOTION Lumbar AROM:  *Indicates pain  All direction WFL, except extension limited to 25%. Does not affect knee symptoms.   PERIPHERAL JOINT MOTION (AROM/PROM in degrees):  *Indicates pain  Hip         B hips WFL except lacking in extension Knee  Flexion: R = 13/0/135 firm end feel, L = 120/125.   Extension: R = 10, L = 5.   STRENGTH:  *Indicates pain Knee  Ext: R = 4+/5, L = 4+/5 mild discomfort  Flex: R = 4+/5, L = 4+/5 mild discomfort   ACCESSORY MOTION:   Mildly hypomobile to AP, PA, rotational glides at talofemoral joint on left.   PALPATION:  TTP at medial joint line and along adductor and medial hamstring, mild TTP and posterior joint line.     TREATMENT: Denies sensitivity to latex Denies history of long term steroid medications Denies any history of spine or knee surgery.   Therapeutic exercise:to centralize symptoms and improve ROM, strength, muscular endurance, and activity tolerance required for successful completion of functional activities.    -NuStep level5using bilateral upper and lower extremities. Seat setting6, handle setting7. For improved extremity mobility, muscular endurance, and activity tolerance; and to induce the analgesic effect of aerobic exercise, stimulate improved joint nutrition, and prepare body structures and systems for following interventions. X7 minutes.Required assistance to get on and off and set up machine. - supine short arc quad to improve quad strength and  knee stability. Cuing for quad contraction and complete ROM. 3x10 each side at7.5#  R and 10# L ankle weights. - seated longarc quad to improve quad strength and knee stability. Cuing for quad contraction and complete ROM. Mild left hip IR to find more comfortable position.Added ball between knees for isometric adduction to improve comfort.3x10 each side at7.5# R and 10# L ankle weights. - hooklying bridges 3x10 with 5# dumbbell over each hip for isometric hamstring strengthening and hip strength. Cuing for technique.  - Side stepping with green theraband wrapped around distal femurs to activate hip stabilizers and abductors in functional weightbearing position. Cuing for form and SBA x1 for safety. Stepping a few steps out and in as allowed by band x2 min alternating directions every 10 feet. .  - attempted step ups to 6" step, x5 left side without UE support concentrating on improving functional  strength, activity tolerance, and control.  - ambulation attempting to keep right knee from hyperextension. Pt able to support weight but unable to feel this position or produce it independently without being verbally cued to "walk in a crouch." To improve gait pattern for less stress on sore knee.  - Education on diagnosis, prognosis, POC, anatomy and physiology of current condition. - measurements to assess progress (see above)  Manual therapy:to reduce pain and tissue tension, improve range of motion, neuromodulation, in order to promote improved ability to complete functional activities. - STM to leftadductorsand medialhamstrings to reduce tension and pain,  -Hooklyingleft tibiofibular joint mobilization grade II-IV, AP, PA and gentle rotational each side, distraction.   Access Code: [email or web address]  URL: [email or web address]  Date: 07/16/2018  Prepared by: Rosita Kea   Exercises   Supine Active Straight Leg Raise - 10-15 reps - 1 second hold - 3 Sets - 1x daily - 7x  weekly   Supine Bridge - 10-15 reps - 1 second hold - 3 Sets - 1x daily - 7x weekly   Hooklying Clamshell with Resistance - 10-15 reps - 1 second hold - 3 Sets - 1x daily - 7x weekly    PT Education - 08/28/18 1146    Education Details  Exercise purpose/form. Self management techniques. Education on diagnosis, prognosis, POC, anatomy and physiology of current condition    Person(s) Educated  Patient    Methods  Explanation;Demonstration;Tactile cues;Verbal cues    Comprehension  Verbalized understanding;Returned demonstration       PT Short Term Goals - 08/28/18 1333      PT SHORT TERM GOAL #1   Title  Be independent with initial home exercise program for self-management of symptoms.    Baseline  Initial HEP provided at IE (07/16/2018);     Time  2    Period  Weeks    Status  Achieved    Target Date  07/31/18        PT Long Term Goals - 08/28/18 1334      PT LONG TERM GOAL #1   Title  Be independent with a long-term home exercise program for self-management of symptoms.     Baseline  Initial HEP provided at IE (07/16/2018); currently participating in HEP appropriate for this stage of care, not yet final HEP (08/28/2018).    Time  4    Period  Weeks    Status  Partially Met    Target Date  09/25/18      PT LONG TERM GOAL #2   Title  Demonstrate improved FOTO score by 10 units to demonstrate improvement in overall condition and self-reported functional ability.     Baseline  FOTO = 38 (07/17/2018); 48 (08/28/2018);     Time  6    Period  Weeks    Status  Achieved    Target Date  08/28/18      PT LONG TERM GOAL #3   Title  Patient will demonstrate left knee strength 5/5 to demonstrate functional strength for improved pain functional mobility.      Baseline  see objective exam    Time  4    Period  Weeks    Status  Partially Met    Target Date  09/25/18      PT LONG TERM GOAL #4   Title  Improve left knee  AROM by/to equal or greater than 125 degrees flexion to allow  patient to complete valued activities with less difficulty.  Baseline  110 degrees (07/16/2018); 120 (08/28/2018);     Time  4    Period  Weeks    Status  Partially Met    Target Date  09/25/18      PT LONG TERM GOAL #5   Title  Complete community, work and/or recreational activities without limitation due to current condition.     Baseline  difficulty with prolonged positions, walking, sleeping, stairs, bending, squatting (07/16/2018); continues to report similar shooting pains that limit similar activities(07/30/2018);     Time  4    Period  Days    Status  On-going    Target Date  09/25/18            Plan - 08/29/18 1207    Clinical Impression Statement  Patient has attended 9 physical therapy treatment sessions this episode of care and participated well. Objectively she demonstrates overall improvement in strength and range of motion. Her FOTO score has improved by 10 points, demonstrating significant improvement in self-reported function. Subjectively, pt reports she continues to be limited by intermittent pain similar to what she had at initial evaluation. Patient has made good progress towards most goals, but she continues to demonstrate continued intermittent functionally limiting pain that leads to difficulty walking, navigating stairs, transferring, twisting, lifting, carrying, and working. Her pain and pattern of dysfunction suggests meniscus may be involved. Recommend pt continue physical therapy based on improvements noted thus far but also follow up with physician for further evaluation and treatment recommendations. Pt tolerated today's treatment fair. She was able to complete all activities without lasting increase in pain until after attempting step ups following side stepping when she started getting her usual shooting pain at the medial knee. Manual intervention was repeated with repeated PROM OP that pt reported felt good, but she continued to have return of symptoms when  attempting step up and some pain with walking. Continues to report episodes of knee giving out. She continues to have point tenderness at medial joint line and tolerates knee extension better with tibia slightly internally rotated. She had some pain with long arc quad. She had pain reduction following manual and reported overall improvement in her pain by end of session. Pt required multimodal cuing for proper technique and to facilitate improved neuromuscular control, strength, range of motion, and functional ability resulting in improved performance and form.Patient will benefit from skilled physical therapy intervention to address current body structure impairments and activity limitations to improve function and work towards goals set in current POC in order to return to prior level of function or maximal functional improvement. Pt would benefit from referral to orthotist for evaluatinon for AFO or orthosis to prevent right knee hyperextension to ipmrove gaint pattern and improve longevity of right knee and decrease negative stress on left knee.     Rehab Potential  Good    Clinical Impairments Affecting Rehab Potential  (+) motivation, experience recovering from funcitonal disability; (-) pre-existing motor and sensory loss, comorbidities, fear of meaning of pain.     PT Frequency  2x / week    PT Duration  6 weeks    PT Treatment/Interventions  ADLs/Self Care Home Management;Aquatic Therapy;Cryotherapy;Moist Heat;Gait training;Stair training;Functional mobility training;Therapeutic activities;Therapeutic exercise;Balance training;Neuromuscular re-education;Patient/family education;Manual techniques;Compression bandaging;Passive range of motion;Dry needling;Taping;Spinal Manipulations;Joint Manipulations;Other (comment)   joint mobilizations grades I-IV   PT Next Visit Plan  continue progressive LE strengthening as tolerated. Return to referring clinician for further medical evaluatoin.     PT Home  Exercise Plan  Medbridge Access Code: [email or web address]     Recommended Other Services  orthotist referral to address right knee hyperextension affecting gait and abnormal stress on BLE    Consulted and Agree with Plan of Care  Patient       Patient will benefit from skilled therapeutic intervention in order to improve the following deficits and impairments:  Abnormal gait, Decreased balance, Decreased endurance, Decreased mobility, Difficulty walking, Hypomobility, Increased muscle spasms, Impaired sensation, Other (comment), Decreased range of motion, Increased edema, Impaired perceived functional ability, Obesity, Decreased activity tolerance, Decreased coordination, Decreased strength, Hypermobility, Impaired flexibility, Pain, Postural dysfunction(incomplete knowledge of appropriate self-management techniques for current problem)  Visit Diagnosis: Left knee pain, unspecified chronicity  Muscle weakness (generalized)  Stiffness of left knee, not elsewhere classified  Difficulty in walking, not elsewhere classified     Problem List Patient Active Problem List   Diagnosis Date Noted  . Colon cancer screening 01/25/2018  . Hyperparathyroidism, primary ([place]) 03/11/2017  . 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 History of ankle fracture 11/04/2016  . Health care maintenance 08/22/2015  . Hyperglycemia 08/10/2015  . Hot flashes 02/14/2015  . Skin lesion of left arm 02/14/2015  . Gait disturbance 07/11/2014  . Right knee pain 12/29/2013  . Essential hypertension, benign 02/08/2013  . History of seizure disorder 09/28/2012  . History of brain tumor 09/11/2012    Rachael Zimmerman, {PT, DPT 08/29/2018, 12:12 PM  [facility] [address], [place], [address] Phone: [phone]   Fax:  [phone]  Name: Rachael Zimmerman MRN: [id number] Date of Birth: May 10, 1961

## 2018-09-01 ENCOUNTER — Telehealth: Payer: Self-pay | Admitting: Physical Therapy

## 2018-09-01 ENCOUNTER — Encounter: Payer: Medicare Other | Admitting: Physical Therapy

## 2018-09-01 NOTE — Telephone Encounter (Signed)
Called pt to update about clinic closing for in -person visits due to COVID-19 pandemic. No answer. Left message letting pt know their appointments are canceled at this time, we are monitoring the phones/voicemail, are looking into telehealth options, and would like to know if they is interested in that. Also let pt know that I am available by phone to discuss any concerns, answer questions, provide advice, modify exercise program, and problem solve remotely; that I want to and be there with them as much as possible without in-person visits. Advised pt to call if they have any PT needs or questions. Provided call back number:  [phone]. Provided email: [email or web address].

## 2018-09-04 ENCOUNTER — Encounter: Payer: Medicare Other | Admitting: Physical Therapy

## 2018-09-09 ENCOUNTER — Encounter: Payer: Medicare Other | Admitting: Physical Therapy

## 2018-09-10 ENCOUNTER — Other Ambulatory Visit: Payer: Self-pay

## 2018-09-10 ENCOUNTER — Other Ambulatory Visit (INDEPENDENT_AMBULATORY_CARE_PROVIDER_SITE_OTHER): Payer: Medicare Other

## 2018-09-10 DIAGNOSIS — I10 Essential (primary) hypertension: Secondary | ICD-10-CM

## 2018-09-10 DIAGNOSIS — R739 Hyperglycemia, unspecified: Secondary | ICD-10-CM | POA: Diagnosis not present

## 2018-09-10 LAB — BASIC METABOLIC PANEL
BUN: 15 mg/dL (ref 6–23)
CO2: 28 mEq/L (ref 19–32)
Calcium: 10.3 mg/dL (ref 8.4–10.5)
Chloride: 105 mEq/L (ref 96–112)
Creatinine, Ser: 0.92 mg/dL (ref 0.40–1.20)
GFR: 75.9 mL/min (ref >60.00)
Glucose, Bld: 119 mg/dL — ABNORMAL HIGH (ref 70–99)
Potassium: 4.6 mEq/L (ref 3.5–5.1)
Sodium: 140 mEq/L (ref 135–145)

## 2018-09-10 LAB — HEPATIC FUNCTION PANEL
ALT: 16 U/L (ref 0–35)
AST: 13 U/L (ref 0–37)
Albumin: 4.1 g/dL (ref 3.5–5.2)
Alkaline Phosphatase: 83 U/L (ref 39–117)
Bilirubin, Direct: 0.1 mg/dL (ref 0.0–0.3)
Total Bilirubin: 0.4 mg/dL (ref 0.2–1.2)
Total Protein: 6.8 g/dL (ref 6.0–8.3)

## 2018-09-10 LAB — LIPID PANEL
Cholesterol: 171 mg/dL (ref 0–200)
HDL: 54.5 mg/dL (ref >39.00)
LDL Cholesterol: 104 mg/dL — ABNORMAL HIGH (ref 0–99)
NonHDL: 116.78
Total CHOL/HDL Ratio: 3
Triglycerides: 66 mg/dL (ref 0.0–149.0)
VLDL: 13.2 mg/dL (ref 0.0–40.0)

## 2018-09-10 LAB — HEMOGLOBIN A1C: Hgb A1c MFr Bld: 6.1 % (ref 4.6–6.5)

## 2018-09-11 ENCOUNTER — Encounter: Payer: Medicare Other | Admitting: Physical Therapy

## 2018-09-12 ENCOUNTER — Encounter: Payer: Self-pay | Admitting: Internal Medicine

## 2018-09-12 ENCOUNTER — Ambulatory Visit: Payer: Medicare Other | Admitting: Internal Medicine

## 2018-09-15 ENCOUNTER — Ambulatory Visit (INDEPENDENT_AMBULATORY_CARE_PROVIDER_SITE_OTHER): Payer: Medicare Other | Admitting: Internal Medicine

## 2018-09-15 DIAGNOSIS — Z1211 Encounter for screening for malignant neoplasm of colon: Secondary | ICD-10-CM | POA: Diagnosis not present

## 2018-09-15 DIAGNOSIS — I10 Essential (primary) hypertension: Secondary | ICD-10-CM

## 2018-09-15 DIAGNOSIS — R739 Hyperglycemia, unspecified: Secondary | ICD-10-CM | POA: Diagnosis not present

## 2018-09-15 DIAGNOSIS — E210 Primary hyperparathyroidism: Secondary | ICD-10-CM | POA: Diagnosis not present

## 2018-09-16 ENCOUNTER — Encounter: Payer: Medicare Other | Admitting: Physical Therapy

## 2018-09-18 ENCOUNTER — Encounter: Payer: Self-pay | Admitting: Family Medicine

## 2018-09-18 ENCOUNTER — Ambulatory Visit: Payer: Self-pay | Admitting: *Deleted

## 2018-09-18 ENCOUNTER — Encounter: Payer: Self-pay | Admitting: Physical Therapy

## 2018-09-18 ENCOUNTER — Encounter: Payer: Medicare Other | Admitting: Physical Therapy

## 2018-09-18 ENCOUNTER — Ambulatory Visit (INDEPENDENT_AMBULATORY_CARE_PROVIDER_SITE_OTHER): Payer: Medicare Other | Admitting: Family Medicine

## 2018-09-18 ENCOUNTER — Other Ambulatory Visit: Payer: Self-pay

## 2018-09-18 ENCOUNTER — Telehealth: Payer: Self-pay | Admitting: Family Medicine

## 2018-09-18 DIAGNOSIS — M25571 Pain in right ankle and joints of right foot: Secondary | ICD-10-CM | POA: Diagnosis not present

## 2018-09-18 MED ORDER — ACETAMINOPHEN-CODEINE #3 300-30 MG PO TABS: 1.0000 | ORAL_TABLET | Freq: Four times a day (QID) | ORAL | 0 refills | Status: DC | PRN

## 2018-09-18 NOTE — Telephone Encounter (Signed)
Message from Berneta Levins sent at 09/18/2018 2:19 PM EDT   Summary: medication question   Pt states she was prescribed and just picked up acetaminophen-codeine (TYLENOL #3) 300-30 MG tablet. Pt states she told doctor her pain on scale of 1-10 was a 13 and she wants to know if 300mg  of Tylenol is going to help her.

## 2018-09-18 NOTE — Telephone Encounter (Signed)
I have called her phone number 3 times and it is going straight to voicemail, no ringing at all  2 messages left to call back  Appt at 11 AM

## 2018-09-18 NOTE — Telephone Encounter (Signed)
Left VM to return call to office 

## 2018-09-18 NOTE — Telephone Encounter (Signed)
Pt aware that it is not regular tylenol. Advised that it has codeine in it as well and should work for her pain

## 2018-09-18 NOTE — Telephone Encounter (Signed)
Pt had phone visit today at 11:10 am with lauren Guse FNP

## 2018-09-18 NOTE — Therapy (Signed)
[place] [facility] [address] [place], [place], [address] Phone: [phone]   Fax:  [phone]  Patient Details  Name: Rachael Zimmerman MRN: [id number] Date of Birth: 02-27-1961 Referring Provider:  No ref. provider found  Encounter Date: 09/18/2018  The [facility] outpatient clinics are closed at this time due to the COVID-19 epidemic. Called pt to discuss options for continued care. No answer. Left message asking preference for one of three options, pt feels safe and wants to hold care until our clinic reopens, pt would like a referral for home health, or pt would like to participate in telehealth. Requested call back at  [phone].  Everlean Alstrom. Graylon Good, PT, DPT 09/18/18, 4:13 PM .  [facility] [address] [place], [place], [address] Phone: [phone]   Fax:  [phone]

## 2018-09-18 NOTE — Progress Notes (Signed)
Patient ID: Rachael Zimmerman, female   DOB: 02/20/1961, 58 y.o.   MRN: [id number]  Virtual Visit via Phone Note  This visit type was conducted due to national recommendations for restrictions regarding the COVID-19 pandemic (e.g. social distancing).  This format is felt to be most appropriate for this patient at this time.  All issues noted in this document were discussed and addressed.  No physical exam was performed (except for noted visual exam findings with Video Visits).   I connected with Rachael Zimmerman on 09/18/18 at 11:00 AM EDT by telephone and verified that I am speaking with the correct person using two identifiers. Location patient: home Location provider: :[place] Persons participating in the virtual visit: patient, provider  I discussed the limitations, risks, security and privacy concerns of performing an evaluation and management service by telephone and the availability of in person appointments. I also discussed with the patient that there may be a patient responsible charge related to this service. The patient expressed understanding and agreed to proceed.  Interactive audio and video telecommunications were attempted between this provider and patient, however failed, due to patient did not have access to video capability.  We continued and completed visit via phone   HPI:  Patient and I connecting the telephone today to discuss pain in right ankle.  Patient states she did have a severe sprain in her right ankle about 4 to 5 years ago, wore a walking type boot that seemed to improve pain.  Denies any known new injury, but notes ever since Monday of this week her ankle has been in pain, tender to touch and a little swollen.  She did wrap ankle with an Ace wrap yesterday which did seem to help calm down pain slightly.  She has been using Tylenol as needed for pain with mild effect in reducing pain symptoms.  Denies any redness or heat radiating off of her skin.  Denies any  history of blood clots, or recent travel including long car rides.  Denies any history of blood clotting disorders.  Denies chest pain, cough shortness of breath or wheezing.  Denies fever or chills.  Denies GI or GU issues.   ROS: See pertinent positives and negatives per HPI.  Past Medical History:  Diagnosis Date  . 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[place])    s/p craniectomy  . Hypertension   . Pre-diabetes    patient denies ; see lab result for a1c 03-06-17 is 6.0  . Seizures ([place])    AFTER BRAIN SURGERY; none recently   . Weakness of right side of body    d/t brain tumor intervention     Past Surgical History:  Procedure Laterality Date  . ABDOMINAL HYSTERECTOMY  2010   ovaries not removed  . BRAIN SURGERY     tumor excision s/p craniectomy  . BREAST BIOPSY Left 2010   benign  . CATARACT EXTRACTION W/PHACO Left 11/07/2016   Procedure: CATARACT EXTRACTION PHACO AND INTRAOCULAR LENS PLACEMENT (IOC);  Surgeon: Estill Cotta, MD;  Location: [facility] ORS;  Service: Ophthalmology;  Laterality: Left;  [place] 00:53.9 AP% 16.7 CDE 17.02 Fluid Pack Lot # [id number]  . PARATHYROIDECTOMY Right 03/14/2017   Procedure: RIGHT PARATHYROIDECTOMY;  Surgeon: Armandina Gemma, MD;  Location: [facility];  Service: General;  Laterality: Right;    Family History  Problem Relation Age of Onset  . Cancer Mother        ovary  . Breast cancer Neg Hx   . Colon cancer Neg Hx  Social History   Tobacco Use  . Smoking status: Never Smoker  . Smokeless tobacco: Never Used  Substance Use Topics  . Alcohol use: Yes    Alcohol/week: 0.0 standard drinks    Comment: seldom     Current Outpatient Medications:  .  Acetaminophen (TYLENOL PO), Take by mouth as needed., Disp: , Rfl:  .  acetaminophen-codeine (TYLENOL #3) 300-30 MG tablet, Take 1 tablet by mouth every 6 (six) hours as needed for moderate pain., Disp: 20 tablet, Rfl: 0 .  hydrochlorothiazide (MICROZIDE) 12.5 MG capsule, TAKE 1 CAPSULE BY MOUTH EVERY DAY, Disp:  90 capsule, Rfl: 1 .  Vitamin D, Ergocalciferol, (DRISDOL) 1.25 MG (50000 UT) CAPS capsule, Take 1 capsule (50,000 Units total) by mouth every 7 (seven) days., Disp: 15 capsule, Rfl: 1  EXAM:  Patient is alert and oriented x3. Seems to be in no distress on phone, speaking fluently, not breathless.   Discussed the following assessment and plan:  Acute right ankle pain - Plan: acetaminophen-codeine (TYLENOL #3) 300-30 MG tablet  She will wear the walking boot she already owns to give good support to right ankle. She will use regular tylenol as needed for mild to moderate pain and can use tylenol #3 for more severe pain. Advised that if over the weekend, pain does not improve with use of walking boot for support and compression, to call office and we can have her come in for an [place]. If pain persists, we will have to refer to orthopedics.    I discussed the assessment and treatment plan with the patient. The patient was provided an opportunity to ask questions and all were answered. The patient agreed with the plan and demonstrated an understanding of the instructions.   The patient was advised to call back or seek an in-person evaluation if the symptoms worsen or if the condition fails to improve as anticipated.  I provided 15 minutes of non-face-to-face time during this encounter.   Rachael Green, FNP

## 2018-09-20 ENCOUNTER — Encounter: Payer: Self-pay | Admitting: Internal Medicine

## 2018-09-20 NOTE — Assessment & Plan Note (Signed)
Blood pressure as outlined.  Controlled.  Continue current medications.  Follow pressures.  Follow metabolic panel.

## 2018-09-20 NOTE — Assessment & Plan Note (Signed)
S/p parathyroidectomy.  Followed by Dr Harlow Asa.  Follow calcium.  Normal bone density 02/2018.  Continues vitamin D supplements.

## 2018-09-20 NOTE — Assessment & Plan Note (Signed)
Low carb diet and exercise.  Follow met b and a1c.  Recent a1c 6.1.  Follow.

## 2018-09-20 NOTE — Progress Notes (Addendum)
Patient ID: Rachael Zimmerman, female   DOB: 07/09/1960, 58 y.o.   MRN: [id number] Telephone Visit:  Note  I connected with Rachael Zimmerman on 09/15/18 at  1:30 PM EDT by telephone.  Verified that I am speaking with the correct person using two identifiers.  Location patient: home Location provider:work Persons participating in the virtual visit: patient, provider  I discussed the limitations of evaluation and management by telephone.  This visit type was conducted due to national recommendations for restrictions regarding the COVID-19 pandemic.  This format is felt to be most appropriate for this patient at this time.  The patient expressed understanding and agreed to proceed.   HPI: This is a scheduled follow up.  She reports she is doing relatively well.  Trying to stay active.  Saw Rachael Zimmerman 06/2018 with left kne pain.  Xray revealed minimal DJD.  Referred to PT.  Better.  Had wellness exam - Dr Rachael Zimmerman.  Last pap 07/19/17 - negative with negative HPV.  Is taking vitamin D q week.  St visit with me, she had adjusted her diet and had lost weight.  Still trying to watch her diet.  No chest pain.  No sob.  No acid reflux reported.  No abdominal pain.  Bowels moving.  States blood pressure doing well.  Last check - 115/60.  Discussed recent labs.  a1c 6.1.  Calculated cholesterol risk - 3.8%.  Need results of cologuard.     ROS: See pertinent positives and negatives per HPI.  Past Medical History:  Diagnosis Date  . 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[place])    s/p craniectomy  . Hypertension   . Pre-diabetes    patient denies ; see lab result for a1c 03-06-17 is 6.0  . Seizures ([place])    AFTER BRAIN SURGERY; none recently   . Weakness of right side of body    d/t brain tumor intervention     Past Surgical History:  Procedure Laterality Date  . ABDOMINAL HYSTERECTOMY  2010   ovaries not removed  . BRAIN SURGERY     tumor excision s/p craniectomy  . BREAST BIOPSY Left 2010   benign  . CATARACT  EXTRACTION W/PHACO Left 11/07/2016   Procedure: CATARACT EXTRACTION PHACO AND INTRAOCULAR LENS PLACEMENT (IOC);  Surgeon: Estill Cotta, MD;  Location: [facility] ORS;  Service: Ophthalmology;  Laterality: Left;  [place] 00:53.9 AP% 16.7 CDE 17.02 Fluid Pack Lot # [id number]  . PARATHYROIDECTOMY Right 03/14/2017   Procedure: RIGHT PARATHYROIDECTOMY;  Surgeon: Armandina Gemma, MD;  Location: [facility];  Service: General;  Laterality: Right;    Family History  Problem Relation Age of Onset  . Cancer Mother        ovary  . Breast cancer Neg Hx   . Colon cancer Neg Hx     SOCIAL HX: reviewed.    Current Outpatient Medications:  .  Acetaminophen (TYLENOL PO), Take by mouth as needed., Disp: , Rfl:  .  acetaminophen-codeine (TYLENOL #3) 300-30 MG tablet, Take 1 tablet by mouth every 6 (six) hours as needed for moderate pain., Disp: 20 tablet, Rfl: 0 .  hydrochlorothiazide (MICROZIDE) 12.5 MG capsule, TAKE 1 CAPSULE BY MOUTH EVERY DAY, Disp: 90 capsule, Rfl: 1 .  Vitamin D, Ergocalciferol, (DRISDOL) 1.25 MG (50000 UT) CAPS capsule, Take 1 capsule (50,000 Units total) by mouth every 7 (seven) days., Disp: 15 capsule, Rfl: 1  EXAM:  VITALS per patient if applicable: 673/41 - per pt.    GENERAL: alert.  Answering questions appropriately.  sounds to be in no acute distress.   PSYCH/NEURO: pleasant and cooperative, no obvious depression or anxiety, speech and thought processing grossly intact  ASSESSMENT AND PLAN:  Discussed the following assessment and plan:  Colon cancer screening  Essential hypertension, benign  Hyperglycemia  Hyperparathyroidism, primary ([place])     I discussed the assessment and treatment plan with the patient. The patient was provided an opportunity to ask questions and all were answered. The patient agreed with the plan and demonstrated an understanding of the instructions.   The patient was advised to call back or seek an in-person evaluation if the symptoms worsen or if  the condition fails to improve as anticipated.  I provided 15 minutes of non-face-to-face time during this encounter.   Einar Pheasant, MD

## 2018-09-20 NOTE — Assessment & Plan Note (Signed)
Returned cologuard.  Need results.

## 2018-09-22 ENCOUNTER — Encounter: Payer: Medicare Other | Admitting: Physical Therapy

## 2018-09-24 ENCOUNTER — Encounter: Payer: Medicare Other | Admitting: Physical Therapy

## 2018-09-29 ENCOUNTER — Encounter: Payer: Medicare Other | Admitting: Physical Therapy

## 2018-10-02 ENCOUNTER — Encounter: Payer: Medicare Other | Admitting: Physical Therapy

## 2018-10-06 ENCOUNTER — Encounter: Payer: Medicare Other | Admitting: Physical Therapy

## 2018-10-09 ENCOUNTER — Encounter: Payer: Medicare Other | Admitting: Physical Therapy

## 2018-10-15 ENCOUNTER — Telehealth: Payer: Self-pay

## 2018-10-15 NOTE — Telephone Encounter (Signed)
Faxed release form to cologuard requesting results

## 2018-10-15 NOTE — Telephone Encounter (Signed)
-----   Message from Einar Pheasant, MD sent at 09/20/2018  8:18 PM EDT ----- Regarding: cologuard results States turned in cologuard - 04/2018.  Need results.    Dr Nicki Reaper

## 2019-02-09 ENCOUNTER — Encounter: Payer: Medicare Other | Admitting: Internal Medicine

## 2019-02-12 ENCOUNTER — Other Ambulatory Visit: Payer: Self-pay

## 2019-02-12 DIAGNOSIS — R6889 Other general symptoms and signs: Secondary | ICD-10-CM | POA: Diagnosis not present

## 2019-02-12 DIAGNOSIS — Z20822 Contact with and (suspected) exposure to covid-19: Secondary | ICD-10-CM

## 2019-02-13 LAB — NOVEL CORONAVIRUS, NAA: SARS-CoV-2, NAA: NOT DETECTED

## 2019-02-28 ENCOUNTER — Other Ambulatory Visit: Payer: Self-pay | Admitting: Internal Medicine

## 2019-05-11 IMAGING — MG MM DIGITAL SCREENING BILAT W/ TOMO W/ CAD
9 of 13 series · 9 of 29 positions shown · non-contrast
Comparison: Previous exam(s).

CLINICAL DATA: Screening.

EXAM:
DIGITAL SCREENING BILATERAL MAMMOGRAM WITH TOMO AND CAD

[R XCCL]
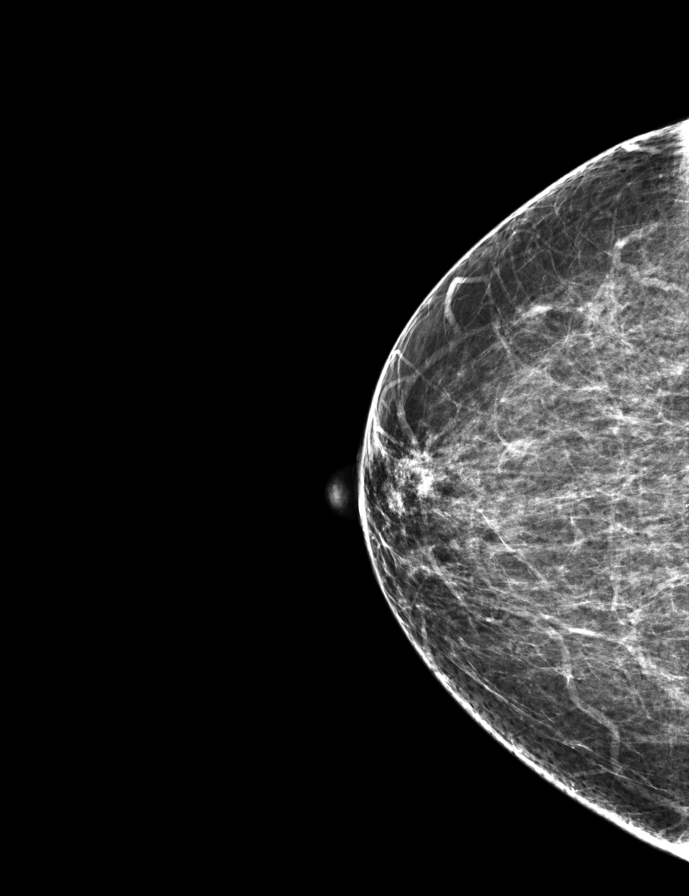

[L CC]
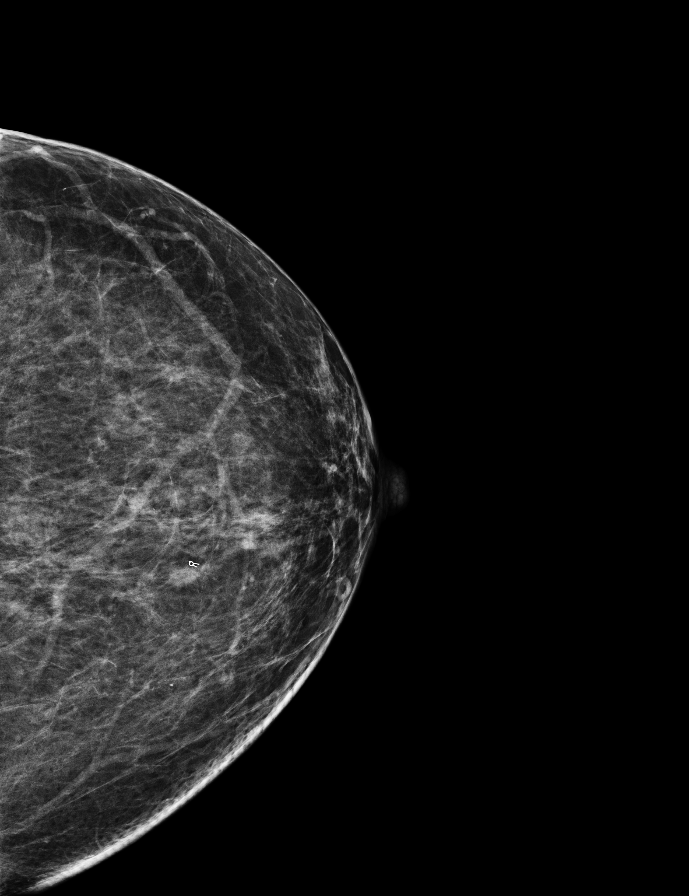

[R MLO synth-2D]
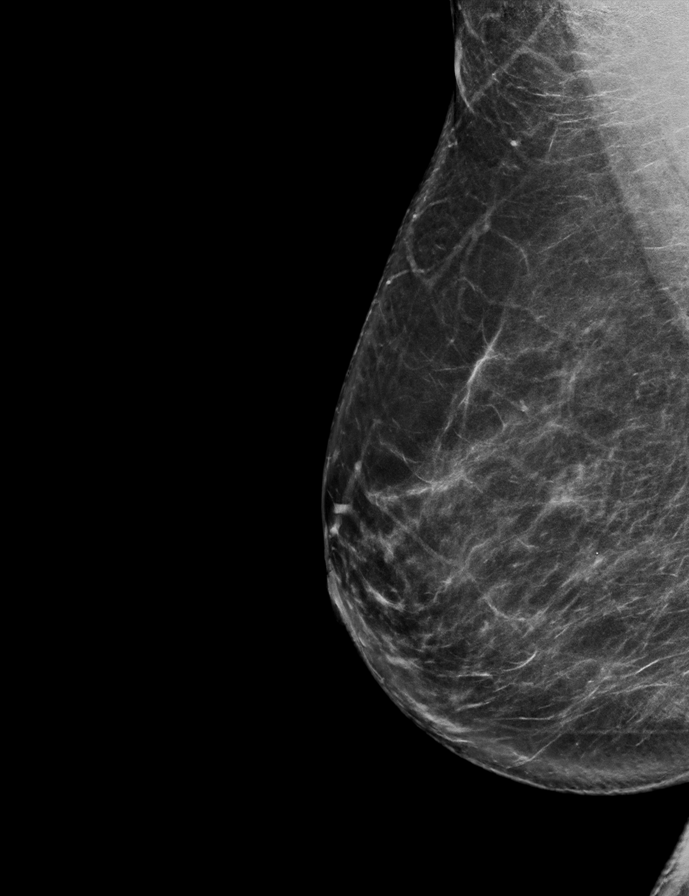

[R CC]
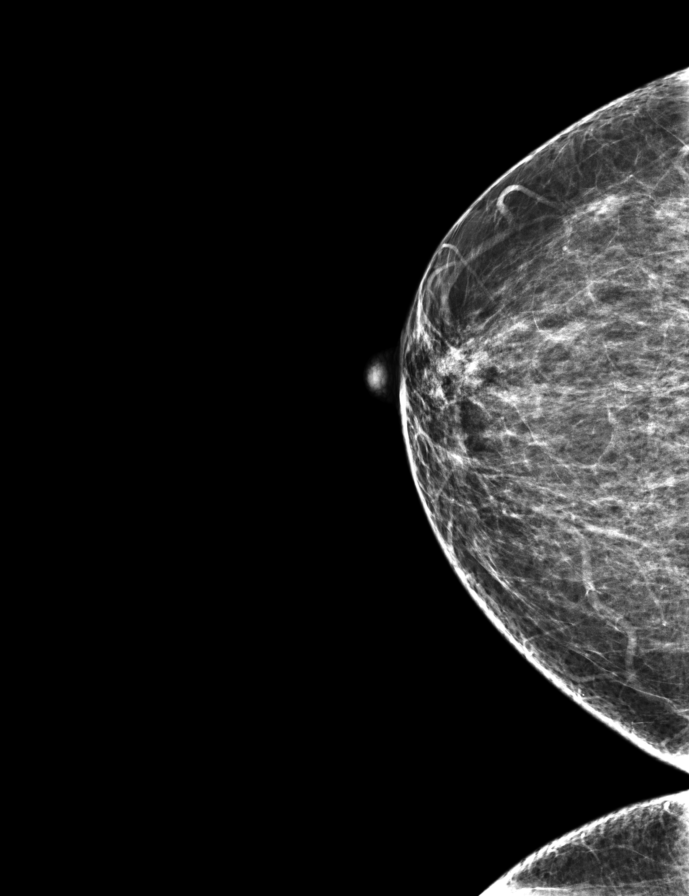

[L CC synth-2D]
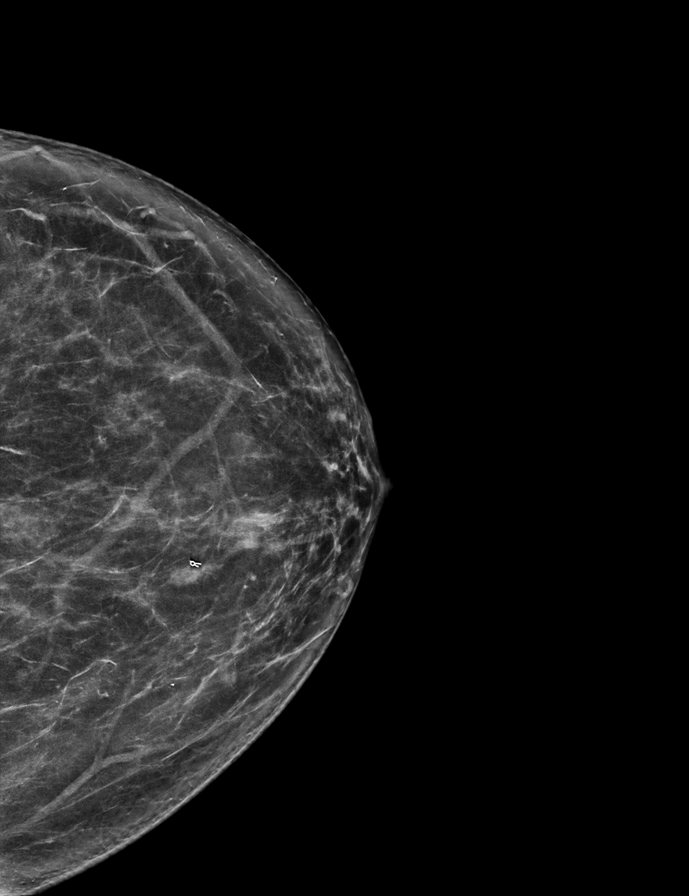

[L MLO]
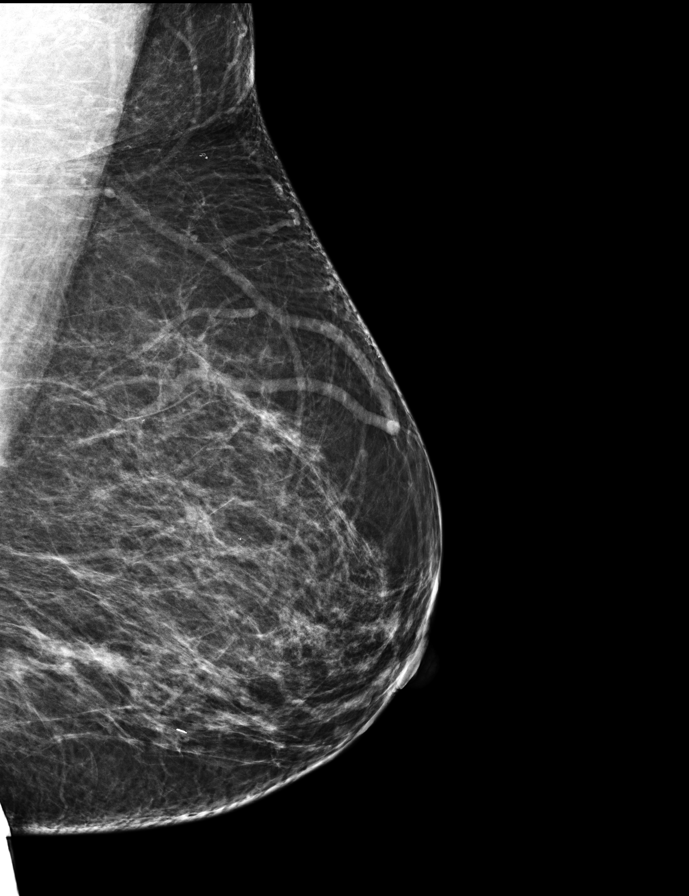

[R MLO]
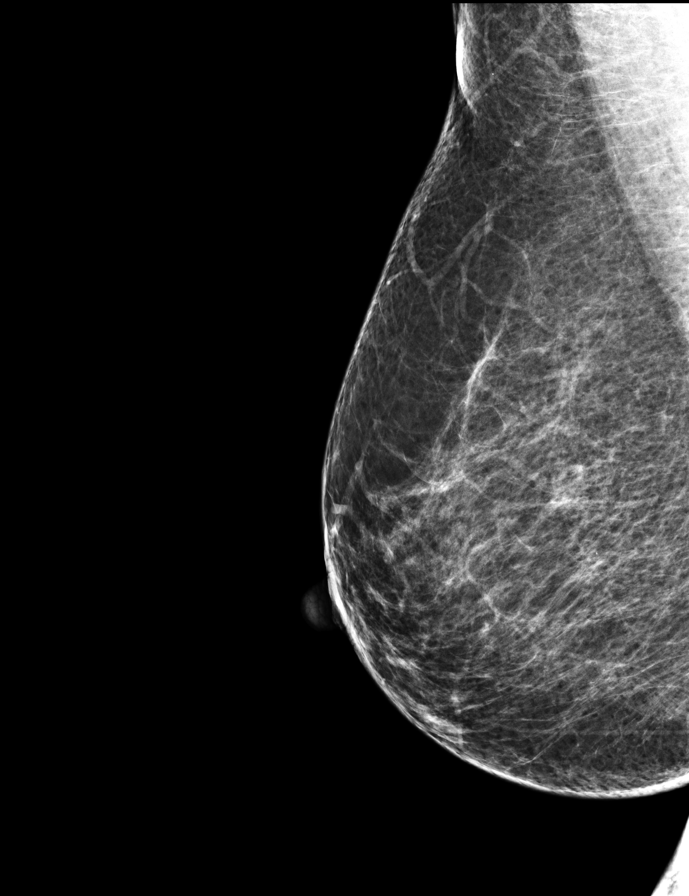

[L MLO synth-2D]
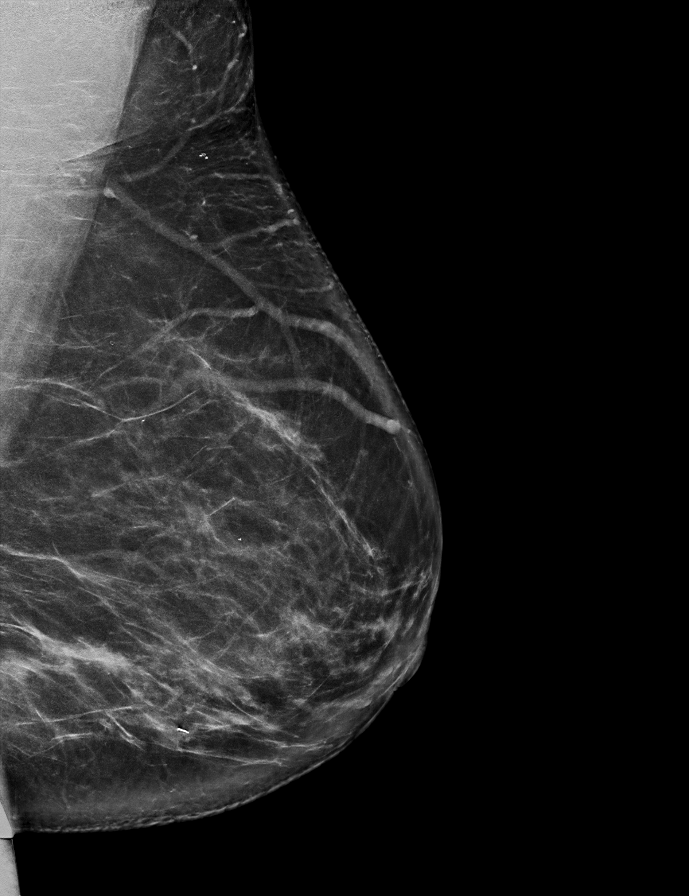

[R CC synth-2D]
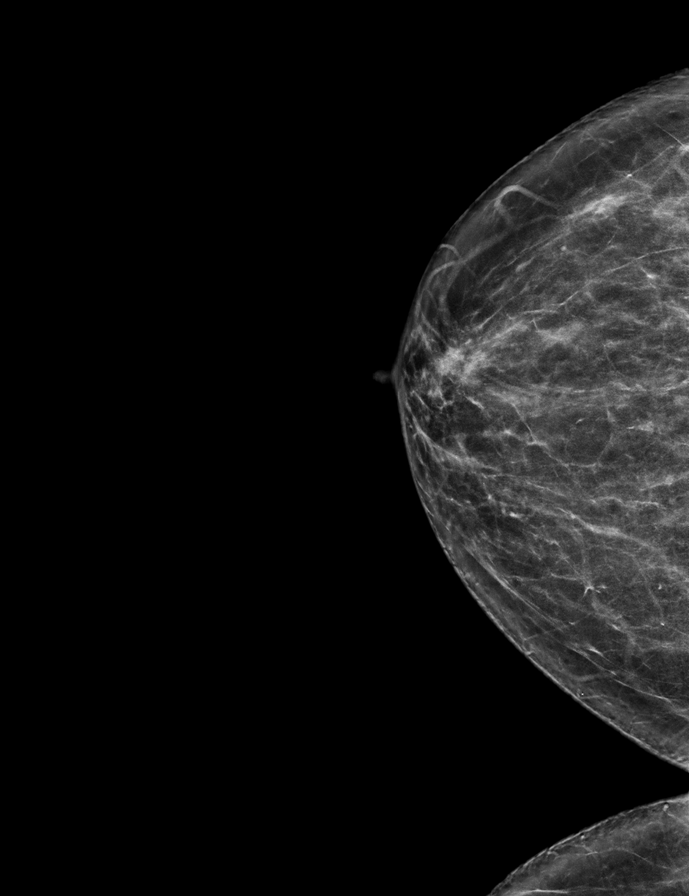

[9 of 29 positions shown; findings below may reference images not displayed]

ACR Breast Density Category c: The breast tissue is heterogeneously
dense, which may obscure small masses.
FINDINGS: There are no findings suspicious for malignancy. Images were
processed with CAD.
IMPRESSION: No mammographic evidence of malignancy. A result letter of this
screening mammogram will be mailed directly to the patient.

RECOMMENDATION:
Screening mammogram in one year. (Code:[id number])

BI-RADS CATEGORY  1: Negative.

## 2019-06-24 ENCOUNTER — Encounter: Payer: Self-pay | Admitting: Physical Therapy

## 2019-06-24 DIAGNOSIS — M6281 Muscle weakness (generalized): Secondary | ICD-10-CM

## 2019-06-24 DIAGNOSIS — R262 Difficulty in walking, not elsewhere classified: Secondary | ICD-10-CM

## 2019-06-24 DIAGNOSIS — M25662 Stiffness of left knee, not elsewhere classified: Secondary | ICD-10-CM

## 2019-06-24 DIAGNOSIS — M25562 Pain in left knee: Secondary | ICD-10-CM

## 2019-06-24 NOTE — Therapy (Signed)
[facility] [address], [place], [address] Phone: [phone]   Fax:  [phone]  Physical Therapy No-Visit Discharge Summary Reporting Period: 07/16/2018 - 06/24/2019 (last visit was 08/28/2018)  Patient Details  Name: Rachael Zimmerman MRN: [id number] Date of Birth: July 21, 1960 Referring Provider (PT): Jodelle Green, FNP   Encounter Date: 06/24/2019    Past Medical History:  Diagnosis Date  . 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[place])    s/p craniectomy  . Hypertension   . Pre-diabetes    patient denies ; see lab result for a1c 03-06-17 is 6.0  . Seizures ([place])    AFTER BRAIN SURGERY; none recently   . Weakness of right side of body    d/t brain tumor intervention     Past Surgical History:  Procedure Laterality Date  . ABDOMINAL HYSTERECTOMY  2010   ovaries not removed  . BRAIN SURGERY     tumor excision s/p craniectomy  . BREAST BIOPSY Left 2010   benign  . CATARACT EXTRACTION W/PHACO Left 11/07/2016   Procedure: CATARACT EXTRACTION PHACO AND INTRAOCULAR LENS PLACEMENT (IOC);  Surgeon: Estill Cotta, MD;  Location: [facility] ORS;  Service: Ophthalmology;  Laterality: Left;  [place] 00:53.9 AP% 16.7 CDE 17.02 Fluid Pack Lot # [id number]  . PARATHYROIDECTOMY Right 03/14/2017   Procedure: RIGHT PARATHYROIDECTOMY;  Surgeon: Armandina Gemma, MD;  Location: [facility];  Service: General;  Laterality: Right;    There were no vitals filed for this visit.  Subjective Assessment - 06/24/19 1345    Subjective  Patient did not return for physial therapy following her 9th visit on 08/28/2018. At that time the clinic closed due to [place] precautions and she did not return when the clinic re-opened.    Pertinent History  Patient is a 59 y.o. female who presents to outpatient physical therapy with a referral for medical diagnosis acute pain of left knee. This patient's chief complaints consist of left knee pain, swelling, increased medication  use to deal with pain, decreased activity tolerance, stiffness leading to the following functional deficits: difficulty with prolonged positions, walking, sleeping, stairs, bending, squatting, sitting.  Relevant past medical history and comorbidities include in 2003 she was diagnosed with a benign  brain tumor and since then she has had surgery for that. 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 After prolonged therapy she is able to walk and move again. She has a limp that is more noticeable when she is tired. 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 She doesn't have a lot of feeling in the right foot down past the ankle. [place] once in the last 6 months (due to rug). Hx of R ankle fracture, history of seizure disorder (last was when she was 2013), hyperparathyroidism and parathyroid adenoma (parathyroidectomy in 2018 recent follow up every thing looks fine). Notes popping and cracking but not locking.     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 No acute abnormality seen in the left knee."    Pain Onset  More than a month ago         OBJECTIVE Patient is not present for examination at this time. Please see previous documentation for latest objective data.     PT Short Term Goals - 08/28/18 1333      PT SHORT TERM GOAL #1   Title  Be independent with initial home exercise program for self-management of symptoms.  Baseline  Initial HEP provided at IE (07/16/2018);     Time  2    Period  Weeks    Status  Achieved    Target Date  07/31/18        PT Long Term Goals - 06/24/19 1346      PT LONG TERM GOAL #1   Title  Be independent with a long-term home exercise program for self-management of symptoms.     Baseline  Initial HEP provided at IE (07/16/2018); currently participating in HEP appropriate for this stage of care, not yet final HEP (08/28/2018).    Time  4    Period  Weeks     Status  Partially Met    Target Date  09/25/18      PT LONG TERM GOAL #2   Title  Demonstrate improved FOTO score by 10 units to demonstrate improvement in overall condition and self-reported functional ability.     Baseline  FOTO = 38 (07/17/2018); 48 (08/28/2018);     Time  6    Period  Weeks    Status  Achieved    Target Date  08/28/18      PT LONG TERM GOAL #3   Title  Patient will demonstrate left knee strength 5/5 to demonstrate functional strength for improved pain functional mobility.      Baseline  see objective exam    Time  4    Period  Weeks    Status  Partially Met    Target Date  09/25/18      PT LONG TERM GOAL #4   Title  Improve left knee  AROM by/to equal or greater than 125 degrees flexion to allow patient to complete valued activities with less difficulty.     Baseline  110 degrees (07/16/2018); 120 (08/28/2018);     Time  4    Period  Weeks    Status  Partially Met    Target Date  09/25/18      PT LONG TERM GOAL #5   Title  Complete community, work and/or recreational activities without limitation due to current condition.     Baseline  difficulty with prolonged positions, walking, sleeping, stairs, bending, squatting (07/16/2018); continues to report similar shooting pains that limit similar activities(07/30/2018);     Time  4    Period  Days    Status  Not Met    Target Date  09/25/18       Plan - 06/24/19 1350    Clinical Impression Statement  Patient attended 9 physical therapy treatment sessions this episode of care and participated well until the clinic closed due to [place] precautions (her last visit was 08/28/2018). She did not return when the clinic re-opened and is now being discharged from physical therapy for lack of participation. Over the course of her care she demonstrated improvement. Objectively she demonstrated overall improvement in strength and range of motion. Her FOTO score improved by 10 points, demonstrating significant improvement in  self-reported function. Subjectively, pt reported she continued to be limited by intermittent pain similar to what she had at initial evaluation. Patient had made good progress towards most goals, but she continued to demonstrate continued intermittent functionally limiting pain that lead to difficulty walking, navigating stairs, transferring, twisting, lifting, carrying, and working. Her pain and pattern of dysfunction suggested meniscus may be involved. Recommended follow up with physician as well as continuing physical therapy at last appointment. Patient is now discharged from physical therapy due to  lack of participation.    Rehab Potential  Good    Clinical Impairments Affecting Rehab Potential  (+) motivation, experience recovering from funcitonal disability; (-) pre-existing motor and sensory loss, comorbidities, fear of meaning of pain.     PT Frequency  2x / week    PT Duration  6 weeks    PT Treatment/Interventions  ADLs/Self Care Home Management;Aquatic Therapy;Cryotherapy;Moist Heat;Gait training;Stair training;Functional mobility training;Therapeutic activities;Therapeutic exercise;Balance training;Neuromuscular re-education;Patient/family education;Manual techniques;Compression bandaging;Passive range of motion;Dry needling;Taping;Spinal Manipulations;Joint Manipulations;Other (comment)   joint mobilizations grades I-IV   PT Next Visit Plan  Patient is now discharged from physical therapy due to lack of participation.    PT Home Exercise Plan  Medbridge Access Code: [email or web address]     Consulted and Agree with Plan of Care  Patient       Patient will benefit from skilled therapeutic intervention in order to improve the following deficits and impairments:  Abnormal gait, Decreased balance, Decreased endurance, Decreased mobility, Difficulty walking, Hypomobility, Increased muscle spasms, Impaired sensation, Other (comment), Decreased range of motion, Increased edema, Impaired perceived functional  ability, Obesity, Decreased activity tolerance, Decreased coordination, Decreased strength, Hypermobility, Impaired flexibility, Pain, Postural dysfunction(incomplete knowledge of appropriate self-management techniques for current problem)  Visit Diagnosis: Left knee pain, unspecified chronicity  Muscle weakness (generalized)  Stiffness of left knee, not elsewhere classified  Difficulty in walking, not elsewhere classified     Problem List Patient Active Problem List   Diagnosis Date Noted  . Colon cancer screening 01/25/2018  . Hyperparathyroidism, primary ([place]) 03/11/2017  . 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 History of ankle fracture 11/04/2016  . Health care maintenance 08/22/2015  . Hyperglycemia 08/10/2015  . Hot flashes 02/14/2015  . Skin lesion of left arm 02/14/2015  . Gait disturbance 07/11/2014  . Right knee pain 12/29/2013  . Essential hypertension, benign 02/08/2013  . History of seizure disorder 09/28/2012  . History of brain tumor 09/11/2012    Everlean Alstrom. Graylon Good, PT, DPT 06/24/19, 1:51 PM  [facility] [address] [place], [address] Phone: [phone]   Fax:  [phone]  Name: Rachael Zimmerman MRN: [id number] Date of Birth: August 17, 1960

## 2019-07-07 ENCOUNTER — Other Ambulatory Visit: Payer: Self-pay | Admitting: Internal Medicine

## 2019-07-07 DIAGNOSIS — Z1231 Encounter for screening mammogram for malignant neoplasm of breast: Secondary | ICD-10-CM

## 2019-08-04 DIAGNOSIS — L918 Other hypertrophic disorders of the skin: Secondary | ICD-10-CM | POA: Diagnosis not present

## 2019-08-04 DIAGNOSIS — Z1211 Encounter for screening for malignant neoplasm of colon: Secondary | ICD-10-CM | POA: Diagnosis not present

## 2019-08-10 ENCOUNTER — Ambulatory Visit
Admission: RE | Admit: 2019-08-10 | Discharge: 2019-08-10 | Disposition: A | Discharge status: Home / Self Care | Payer: Medicare Other | Source: Ambulatory Visit | Attending: Internal Medicine | Admitting: Internal Medicine

## 2019-08-10 DIAGNOSIS — Z1231 Encounter for screening mammogram for malignant neoplasm of breast: Secondary | ICD-10-CM | POA: Diagnosis not present

## 2019-08-20 ENCOUNTER — Ambulatory Visit: Payer: Medicare Other | Attending: Internal Medicine

## 2019-08-20 DIAGNOSIS — Z23 Encounter for immunization: Secondary | ICD-10-CM

## 2019-08-20 NOTE — Progress Notes (Signed)
   Covid-19 Vaccination Clinic  Name:  TACARA MAZZARO    MRN: [id number] DOB: 1960/07/05  08/20/2019  Ms. Ginley was observed post Covid-19 immunization for 15 minutes without incident. She was provided with Vaccine Information Sheet and instruction to access the V-Safe system.   Ms. Mabie was instructed to call 911 with any severe reactions post vaccine: Marland Kitchen Difficulty breathing  . Swelling of face and throat  . A fast heartbeat  . A bad rash all over body  . Dizziness and weakness   Immunizations Administered    Name Date Dose VIS Date Route   Pfizer COVID-19 Vaccine 08/20/2019  3:25 PM 0.3 mL 05/22/2019 Intramuscular   Manufacturer: [place]   Lot: [id number]   [place]: [id number]

## 2019-09-10 ENCOUNTER — Other Ambulatory Visit: Payer: Self-pay | Admitting: Internal Medicine

## 2019-09-15 ENCOUNTER — Ambulatory Visit: Payer: Medicare Other | Attending: Internal Medicine

## 2019-09-15 DIAGNOSIS — Z23 Encounter for immunization: Secondary | ICD-10-CM

## 2019-09-15 NOTE — Progress Notes (Signed)
   Covid-19 Vaccination Clinic  Name:  Rachael Zimmerman    MRN: [id number] DOB: 1960-08-09  09/15/2019  [name] was observed post Covid-19 immunization for 15 minutes without incident. She was provided with Vaccine Information Sheet and instruction to access the V-Safe system.   [name] was instructed to call 911 with any severe reactions post vaccine: Marland Kitchen Difficulty breathing  . Swelling of face and throat  . A fast heartbeat  . A bad rash all over body  . Dizziness and weakness   Immunizations Administered    Name Date Dose VIS Date Route   Pfizer COVID-19 Vaccine 09/15/2019  8:50 AM 0.3 mL 05/22/2019 Intramuscular   Manufacturer: [employer]   Lot: [id number]   [place]: [id number]

## 2019-09-22 ENCOUNTER — Telehealth: Payer: Self-pay | Admitting: Internal Medicine

## 2019-09-22 NOTE — Telephone Encounter (Signed)
Left message for patient to call back and schedule Medicare Annual Wellness Visit (AWV) either virtually or audio only.  Last AWV 07/09/14; please schedule at anytime with Denisa O'Brien-Blaney at [facility].

## 2019-10-05 ENCOUNTER — Ambulatory Visit (INDEPENDENT_AMBULATORY_CARE_PROVIDER_SITE_OTHER): Payer: Medicare Other

## 2019-10-05 VITALS — Ht 62.0 in | Wt 171.0 lb

## 2019-10-05 DIAGNOSIS — Z1159 Encounter for screening for other viral diseases: Secondary | ICD-10-CM

## 2019-10-05 DIAGNOSIS — Z0000 Encounter for general adult medical examination without abnormal findings: Secondary | ICD-10-CM

## 2019-10-05 NOTE — Patient Instructions (Addendum)
  Ms. Maki , Thank you for taking time to come for your Medicare Wellness Visit. I appreciate your ongoing commitment to your health goals. Please review the following plan we discussed and let me know if I can assist you in the future.   These are the goals we discussed: Goals      Patient Stated   . I would like to resume physical therapy exercises (pt-stated)     Increase physical exercise/balance       This is a list of the screening recommended for you and due dates:  Health Maintenance  Topic Date Due  .  Hepatitis C: One time screening is recommended by Center for Disease Control  (CDC) for  adults born from [birth year] through 1965.   Never done  . HIV Screening  Never done  . Tetanus Vaccine  Never done  . Cologuard (Stool DNA test)  Never done  . Flu Shot  01/10/2020  . Mammogram  08/09/2021  . Pap Smear  07/26/2022  . COVID-19 Vaccine  Completed

## 2019-10-05 NOTE — Progress Notes (Addendum)
Subjective:   Rachael Zimmerman is a 59 y.o. female who presents for Medicare Annual (Subsequent) preventive examination.  Review of Systems:  No ROS.  Medicare Wellness Virtual Visit.  Visual/audio telehealth visit, UTA vital signs.   Ht/Wt provided.  See social history for additional risk factors.   Cardiac Risk Factors include: advanced age (>64mn, >>20women);hypertension     Objective:     Vitals: Ht 5' 2"  (1.575 m)   Wt 171 lb (77.6 kg)   BMI 31.28 kg/m   Body mass index is 31.28 kg/m.  Advanced Directives 10/05/2019 07/16/2018 08/10/2017 03/14/2017 03/14/2017 03/07/2017 11/07/2016  Does Patient Have a Medical Advance Directive? No No No No No No No  Would patient like information on creating a medical advance directive? Yes (MAU/Ambulatory/Procedural Areas - Information given) No - Patient declined Yes (ED - Information included in AVS) No - Patient declined No - Patient declined No - Patient declined -    Tobacco Social History   Tobacco Use  Smoking Status Never Smoker  Smokeless Tobacco Never Used     Counseling given: Not Answered   Clinical Intake:  Pre-visit preparation completed: Yes           How often do you need to have someone help you when you read instructions, pamphlets, or other written materials from your doctor or pharmacy?: 1 - Never  Interpreter Needed?: No     Past Medical History:  Diagnosis Date  . 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    s/p craniectomy  . Hypertension   . Pre-diabetes    patient denies ; see lab result for a1c 03-06-17 is 6.0  . Seizures ([place]    AFTER BRAIN SURGERY; none recently   . Weakness of right side of body    d/t brain tumor intervention    Past Surgical History:  Procedure Laterality Date  . ABDOMINAL HYSTERECTOMY  2010   ovaries not removed  . BRAIN SURGERY     tumor excision s/p craniectomy  . BREAST BIOPSY Left 2010   benign  . CATARACT EXTRACTION W/PHACO Left 11/07/2016   Procedure: CATARACT EXTRACTION  PHACO AND INTRAOCULAR LENS PLACEMENT (IOC);  Surgeon: DEstill Cotta MD;  Location: [facility] ORS;  Service: Ophthalmology;  Laterality: Left;  [place]:53.9 AP% 16.7 CDE 17.02 Fluid Pack Lot # [id number]  . PARATHYROIDECTOMY Right 03/14/2017   Procedure: RIGHT PARATHYROIDECTOMY;  Surgeon: GArmandina Gemma MD;  Location: [facility];  Service: General;  Laterality: Right;   Family History  Problem Relation Age of Onset  . Cancer Mother        ovary  . Breast cancer Neg Hx   . Colon cancer Neg Hx    Social History   Socioeconomic History  . Marital status: Legally Separated    Spouse name: Not on file  . Number of children: 1  . Years of education: Not on file  . Highest education level: Not on file  Occupational History    Employer: [employer] Tobacco Use  . Smoking status: Never Smoker  . Smokeless tobacco: Never Used  Substance and Sexual Activity  . Alcohol use: Yes    Alcohol/week: 0.0 standard drinks    Comment: seldom   . Drug use: No  . Sexual activity: Not on file  Other Topics Concern  . Not on file  Social History Narrative  . Not on file   Social Determinants of Health   Financial Resource Strain:   . Difficulty of  Paying Living Expenses:   Food Insecurity:   . Worried About Charity fundraiser in the Last Year:   . Arboriculturist in the Last Year:   Transportation Needs:   . Film/video editor (Medical):   Marland Kitchen Lack of Transportation (Non-Medical):   Physical Activity:   . Days of Exercise per Week:   . Minutes of Exercise per Session:   Stress:   . Feeling of Stress :   Social Connections:   . Frequency of Communication with Friends and Family:   . Frequency of Social Gatherings with Friends and Family:   . Attends Religious Services:   . Active Member of Clubs or Organizations:   . Attends Archivist Meetings:   Marland Kitchen Marital Status:     Outpatient Encounter Medications as of 10/05/2019  Medication Sig  . hydrochlorothiazide  (MICROZIDE) 12.5 MG capsule TAKE 1 CAPSULE BY MOUTH EVERY DAY  . Acetaminophen (TYLENOL PO) Take by mouth as needed.  . [DISCONTINUED] acetaminophen-codeine (TYLENOL #3) 300-30 MG tablet Take 1 tablet by mouth every 6 (six) hours as needed for moderate pain.  . [DISCONTINUED] Vitamin D, Ergocalciferol, (DRISDOL) 1.25 MG (50000 UT) CAPS capsule Take 1 capsule (50,000 Units total) by mouth every 7 (seven) days.   No facility-administered encounter medications on file as of 10/05/2019.    Activities of Daily Living In your present state of health, do you have any difficulty performing the following activities: 10/05/2019  Hearing? N  Vision? N  Difficulty concentrating or making decisions? N  Walking or climbing stairs? N  Dressing or bathing? N  Doing errands, shopping? N  Preparing Food and eating ? N  Using the Toilet? N  In the past six months, have you accidently leaked urine? N  Do you have problems with loss of bowel control? N  Managing your Medications? N  Managing your Finances? N  Housekeeping or managing your Housekeeping? N  Some recent data might be hidden    Patient Care Team: Einar Pheasant, MD as PCP - General (Internal Medicine)    Assessment:   This is a routine wellness examination for [name].  Nurse connected with patient 10/05/19 at  1:00 PM EDT by a telephone enabled telemedicine application and verified that I am speaking with the correct person using two identifiers. Patient stated full name and DOB. Patient gave permission to continue with virtual visit. Patient's location was at home and Nurse's location was at [place] office.   Patient is alert and oriented x3. Patient denies difficulty focusing or concentrating.  Health Maintenance Due: -Tdap and Shingles vaccine- discussed; to be completed with doctor in visit or local pharmacy.  -Hepatitis C Screening- consent given; order placed -HIV screening- followed by pcp  -Colonoscopy- declined -Cologuard-  confirms receipt of kit; plans to complete  See completed HM at the end of note.   Eye: Visual acuity not assessed. Virtual visit. Followed by their ophthalmologist.  Dental: Visits every 6 months.    Hearing: Demonstrates normal hearing during visit.  Safety:  Patient feels safe at home- yes Patient does have smoke detectors at home- yes Patient does wear sunscreen or protective clothing when in direct sunlight - yes Patient does wear seat belt when in a moving vehicle - yes Patient drives- yes Adequate lighting in walkways free from debris- yes Grab bars and handrails used as appropriate- yes Ambulates with an assistive device- no Cell phone on person when ambulating outside of the home- yes  Medication:  Taking as directed and without issues.  Self managed - yes   Covid-19: Precautions and sickness symptoms discussed. Wears mask, social distancing, hand hygiene as appropriate.   Activities of Daily Living Patient denies needing assistance with: household chores, feeding themselves, getting from bed to chair, getting to the toilet, bathing/showering, dressing, managing money, or preparing meals.   Discussed the importance of a healthy diet, water intake and the benefits of aerobic exercise.  Physical activity- walking, no routine  Diet:  Regular Water: good intake Caffeine: none  Other Providers Patient Care Team: Einar Pheasant, MD as PCP - General (Internal Medicine)  Exercise Activities and Dietary recommendations Current Exercise Habits: Home exercise routine, Type of exercise: walking, Intensity: Mild  Goals      Patient Stated   . I would like to resume physical therapy exercises (pt-stated)     Increase physical exercise/balance       Fall Risk Fall Risk  10/05/2019 06/13/2017 05/01/2016 07/09/2014  Falls in the past year? 0 No No No  Follow up Falls evaluation completed - - -   Timed Get Up and Go performed: no, virtual visit  Depression  Screen PHQ 2/9 Scores 10/05/2019 06/13/2017 05/01/2016 07/09/2014  PHQ - 2 Score 0 0 0 0     Cognitive Function     6CIT Screen 10/05/2019  What Year? 0 points  What month? 0 points  What time? 0 points  Months in reverse 0 points    Immunization History  Administered Date(s) Administered  . Influenza,inj,Quad PF,6+ Mos 03/25/2014, 05/01/2016, 03/06/2017, 05/07/2018  . PFIZER SARS-COV-2 Vaccination 08/20/2019, 09/15/2019   Screening Tests Health Maintenance  Topic Date Due  . Hepatitis C Screening  Never done  . HIV Screening  Never done  . TETANUS/TDAP  Never done  . Fecal DNA (Cologuard)  Never done  . INFLUENZA VACCINE  01/10/2020  . MAMMOGRAM  08/09/2021  . PAP SMEAR-Modifier  07/26/2022  . COVID-19 Vaccine  Completed       Plan:   Keep all routine maintenance appointments.   Annual follow up 11/12/19 @ 9:30. Fasting for labs. Request referral for mole removal under the breast line.   Cologuard- plans to complete prior to 11/12/19.  Medicare Attestation I have personally reviewed: The patient's medical and social history Their use of alcohol, tobacco or illicit drugs Their current medications and supplements The patient's functional ability including ADLs,fall risks, home safety risks, cognitive, and hearing and visual impairment Diet and physical activities Evidence for depression   I have reviewed and discussed with patient certain preventive protocols, quality metrics, and best practice recommendations.     Varney Biles, LPN  07/25/863   Reviewed above information.  Agree with plan.  Will place order for referral.  Confirm no preference of which dermatologist she wants to see.    Dr Nicki Reaper

## 2019-10-06 ENCOUNTER — Telehealth: Payer: Self-pay | Admitting: Internal Medicine

## 2019-10-06 DIAGNOSIS — L989 Disorder of the skin and subcutaneous tissue, unspecified: Secondary | ICD-10-CM

## 2019-10-06 NOTE — Telephone Encounter (Signed)
No particular dermatologist requested. Patient is agreeable to physician preferred.

## 2019-10-06 NOTE — Telephone Encounter (Signed)
Order placed for dermatology referral.  

## 2019-10-06 NOTE — Telephone Encounter (Signed)
Reviewed her annual wellness note and she requested referral to dermatology for mole removal.  Does she have a preference of which dermatologist she wants to see.  Just let me know and I can place order for referral. Thanks.

## 2019-10-06 NOTE — Addendum Note (Signed)
Addended by: Alisa Graff on: 10/06/2019 09:24 PM   Modules accepted: Orders

## 2019-10-09 ENCOUNTER — Telehealth: Payer: Self-pay | Admitting: Internal Medicine

## 2019-10-09 NOTE — Telephone Encounter (Signed)
I left pt a vm to call ofc regarding referral.

## 2019-10-12 NOTE — Telephone Encounter (Signed)
Pt returned your call.  

## 2019-10-22 ENCOUNTER — Telehealth: Payer: Self-pay | Admitting: Internal Medicine

## 2019-10-22 NOTE — Telephone Encounter (Signed)
Ok. Thanks!

## 2019-10-22 NOTE — Telephone Encounter (Signed)
Left pt vm to call ofc regarding the derm not taking pt ins. And if she had another derm in mind.

## 2019-11-11 ENCOUNTER — Other Ambulatory Visit: Payer: Self-pay

## 2019-11-12 ENCOUNTER — Ambulatory Visit (INDEPENDENT_AMBULATORY_CARE_PROVIDER_SITE_OTHER): Payer: Medicare Other | Admitting: Internal Medicine

## 2019-11-12 DIAGNOSIS — M25562 Pain in left knee: Secondary | ICD-10-CM

## 2019-11-12 DIAGNOSIS — Z1211 Encounter for screening for malignant neoplasm of colon: Secondary | ICD-10-CM | POA: Diagnosis not present

## 2019-11-12 DIAGNOSIS — I10 Essential (primary) hypertension: Secondary | ICD-10-CM | POA: Diagnosis not present

## 2019-11-12 DIAGNOSIS — E210 Primary hyperparathyroidism: Secondary | ICD-10-CM | POA: Diagnosis not present

## 2019-11-12 DIAGNOSIS — R739 Hyperglycemia, unspecified: Secondary | ICD-10-CM

## 2019-11-12 DIAGNOSIS — D351 Benign neoplasm of parathyroid gland: Secondary | ICD-10-CM

## 2019-11-12 NOTE — Progress Notes (Signed)
Patient ID: Rachael Zimmerman, female   DOB: 06-24-1960, 59 y.o.   MRN: [id number]   Subjective:    Patient ID: Rachael Zimmerman, female    DOB: 03-08-1961, 59 y.o.   MRN: [id number]  HPI This visit occurred during the SARS-CoV-2 public health emergency.  Safety protocols were in place, including screening questions prior to the visit, additional usage of staff PPE, and extensive cleaning of exam room while observing appropriate contact time as indicated for disinfecting solutions.  Patient here for a scheduled follow up  She has been having problems with left knee pain.  Had referred her to PT.  Did not go due to covid restrictions, etc.  Comes in today stating left knee continues to bother her.  High stip Lucianne Lei) - aggravates the knee.  Worsened over the last month.  Discussed referral back to PT and ortho evaluation, given persistent problems.  No chest pain or sob reported.  No abdominal pain or bowel change reported.  Discussed the need for colonoscopy.  Sees gyn - Dr Ouida Sills.  Last evaluated 08/04/19.  He referred her to GI for colonoscopy.  Discussed diet and exercise.    Past Medical History:  Diagnosis Date  . 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[place])    s/p craniectomy  . Hypertension   . Pre-diabetes    patient denies ; see lab result for a1c 03-06-17 is 6.0  . Seizures ([place])    AFTER BRAIN SURGERY; none recently   . Weakness of right side of body    d/t brain tumor intervention    Past Surgical History:  Procedure Laterality Date  . ABDOMINAL HYSTERECTOMY  2010   ovaries not removed  . BRAIN SURGERY     tumor excision s/p craniectomy  . BREAST BIOPSY Left 2010   benign  . CATARACT EXTRACTION W/PHACO Left 11/07/2016   Procedure: CATARACT EXTRACTION PHACO AND INTRAOCULAR LENS PLACEMENT (IOC);  Surgeon: Estill Cotta, MD;  Location: [facility] ORS;  Service: Ophthalmology;  Laterality: Left;  [place] 00:53.9 AP% 16.7 CDE 17.02 Fluid Pack Lot # [id number]  . PARATHYROIDECTOMY Right 03/14/2017    Procedure: RIGHT PARATHYROIDECTOMY;  Surgeon: Armandina Gemma, MD;  Location: [facility];  Service: General;  Laterality: Right;   Family History  Problem Relation Age of Onset  . Cancer Mother        ovary  . Breast cancer Neg Hx   . Colon cancer Neg Hx    Social History   Socioeconomic History  . Marital status: Legally Separated    Spouse name: Not on file  . Number of children: 1  . Years of education: Not on file  . Highest education level: Not on file  Occupational History    Employer: [employer]  Tobacco Use  . Smoking status: Never Smoker  . Smokeless tobacco: Never Used  Substance and Sexual Activity  . Alcohol use: Yes    Alcohol/week: 0.0 standard drinks    Comment: seldom   . Drug use: No  . Sexual activity: Not on file  Other Topics Concern  . Not on file  Social History Narrative  . Not on file   Social Determinants of Health   Financial Resource Strain:   . Difficulty of Paying Living Expenses:   Food Insecurity:   . Worried About Charity fundraiser in the Last Year:   . Arboriculturist in the Last Year:   Transportation Needs:   . Film/video editor (Medical):   Marland Kitchen  Lack of Transportation (Non-Medical):   Physical Activity:   . Days of Exercise per Week:   . Minutes of Exercise per Session:   Stress:   . Feeling of Stress :   Social Connections:   . Frequency of Communication with Friends and Family:   . Frequency of Social Gatherings with Friends and Family:   . Attends Religious Services:   . Active Member of Clubs or Organizations:   . Attends Archivist Meetings:   Marland Kitchen Marital Status:     Outpatient Encounter Medications as of 11/12/2019  Medication Sig  . Acetaminophen (TYLENOL PO) Take by mouth as needed.  . hydrochlorothiazide (MICROZIDE) 12.5 MG capsule TAKE 1 CAPSULE BY MOUTH EVERY DAY   No facility-administered encounter medications on file as of 11/12/2019.   Review of Systems  Constitutional: Negative for  appetite change and unexpected weight change.  HENT: Negative for congestion and sinus pressure.   Respiratory: Negative for cough, chest tightness and shortness of breath.   Cardiovascular: Negative for chest pain, palpitations and leg swelling.  Gastrointestinal: Negative for abdominal pain, nausea, rectal pain and vomiting.  Genitourinary: Negative for difficulty urinating and dysuria.  Musculoskeletal:       Persistent knee pain as outlined.    Skin: Negative for color change and rash.  Neurological: Negative for dizziness, light-headedness and headaches.  Psychiatric/Behavioral: Negative for agitation and dysphoric mood.       Objective:    Physical Exam Vitals reviewed.  Constitutional:      General: She is not in acute distress.    Appearance: Normal appearance.  HENT:     Head: Normocephalic and atraumatic.     Right Ear: External ear normal.  Eyes:     General: No scleral icterus.       Right eye: No discharge.        Left eye: No discharge.     Conjunctiva/sclera: Conjunctivae normal.  Neck:     Thyroid: No thyromegaly.  Cardiovascular:     Rate and Rhythm: Normal rate and regular rhythm.  Pulmonary:     Effort: No respiratory distress.     Breath sounds: Normal breath sounds. No wheezing.  Abdominal:     General: Bowel sounds are normal.     Palpations: Abdomen is soft.     Tenderness: There is no abdominal tenderness.  Musculoskeletal:        General: No swelling or tenderness.     Cervical back: Neck supple.     Comments: No significant pain with palpation.  Some increased discomfort with standing.    Lymphadenopathy:     Cervical: No cervical adenopathy.  Skin:    Findings: No erythema or rash.  Neurological:     Mental Status: She is alert.  Psychiatric:        Mood and Affect: Mood normal.        Behavior: Behavior normal.     BP 130/78   Pulse 81   Temp 98.1 F (36.7 C)   Resp 16   Ht _0  (1.575 m)   Wt 174 lb 6.4 oz (79.1 kg)   SpO2  99%   BMI 31.90 kg/m  Wt Readings from Last 3 Encounters:  11/12/19 174 lb 6.4 oz (79.1 kg)  10/05/19 171 lb (77.6 kg)  06/12/18 171 lb 3.2 oz (77.7 kg)     Lab Results  Component Value Date   WBC 10.4 08/10/2017   HGB 13.2 08/10/2017   HCT 39.4 08/10/2017  PLT 167 08/10/2017   GLUCOSE 119 (H) 09/10/2018   CHOL 171 09/10/2018   TRIG 66.0 09/10/2018   HDL 54.50 09/10/2018   LDLCALC 104 (H) 09/10/2018   ALT 16 09/10/2018   AST 13 09/10/2018   NA 140 09/10/2018   K 4.6 09/10/2018   CL 105 09/10/2018   CREATININE 0.92 09/10/2018   BUN 15 09/10/2018   CO2 28 09/10/2018   TSH 1.93 02/13/2018   HGBA1C 6.1 09/10/2018    MM 3D SCREEN BREAST BILATERAL  Result Date: 08/10/2019 CLINICAL DATA:  Screening. EXAM: DIGITAL SCREENING BILATERAL MAMMOGRAM WITH TOMO AND CAD COMPARISON:  Previous exam(s). ACR Breast Density Category b: There are scattered areas of fibroglandular density. FINDINGS: There are no findings suspicious for malignancy. Images were processed with CAD. IMPRESSION: No mammographic evidence of malignancy. A result letter of this screening mammogram will be mailed directly to the patient. RECOMMENDATION: Screening mammogram in one year. (Code:SM-B-01Y) BI-RADS CATEGORY  1: Negative. Electronically Signed   By: Curlene Dolphin M.D.   On: 08/10/2019 15:03       Assessment & Plan:   Problem List Items Addressed This Visit    Colon cancer screening    Discussed the need for colonoscopy.  Per note, referred by gyn.        Essential hypertension, benign    Blood pressure as outlined.  Continue hctz.  Follow pressures.  Follow metabolic panel.        Relevant Orders   CBC with Differential/Platelet   Hepatic function panel   Lipid panel   TSH   Basic metabolic panel   Hyperglycemia    Low carb diet and exercise.  Follow met b and a1c.        Relevant Orders   Hemoglobin A1c   Lipid panel   Hyperparathyroidism, primary ([place])    S/p parathyroidectomy.  Has been  followed by Dr Harlow Asa.  Follow calcium.  Normal bone denstiy 02/2018.        Left knee pain    Persistent left knee pain - worsened over the last month.  Xray with DJD.  Discussed further w/up and evaluation.  Refer to PT - to evaluate and treat.        Relevant Orders   Ambulatory referral to Physical Therapy   Parathyroid adenoma    S/p parathyroidectomy.  Has been followed by Dr Harlow Asa.  Follow calcium.            Einar Pheasant, MD

## 2019-11-18 ENCOUNTER — Encounter: Payer: Self-pay | Admitting: Internal Medicine

## 2019-11-18 DIAGNOSIS — M25562 Pain in left knee: Secondary | ICD-10-CM | POA: Insufficient documentation

## 2019-11-18 NOTE — Assessment & Plan Note (Signed)
S/p parathyroidectomy.  Has been followed by Dr Harlow Asa.  Follow calcium.  Normal bone denstiy 02/2018.

## 2019-11-18 NOTE — Assessment & Plan Note (Signed)
Persistent left knee pain - worsened over the last month.  Xray with DJD.  Discussed further w/up and evaluation.  Refer to PT - to evaluate and treat.

## 2019-11-18 NOTE — Assessment & Plan Note (Signed)
Blood pressure as outlined.  Continue hctz.  Follow pressures.  Follow metabolic panel.  

## 2019-11-18 NOTE — Assessment & Plan Note (Signed)
Discussed the need for colonoscopy.  Per note, referred by gyn.

## 2019-11-18 NOTE — Assessment & Plan Note (Signed)
S/p parathyroidectomy.  Has been followed by Dr Harlow Asa.  Follow calcium.

## 2019-11-18 NOTE — Assessment & Plan Note (Signed)
Low carb diet and exercise.  Follow met b and a1c.   

## 2019-12-28 DIAGNOSIS — Z01818 Encounter for other preprocedural examination: Secondary | ICD-10-CM | POA: Diagnosis not present

## 2019-12-31 DIAGNOSIS — K648 Other hemorrhoids: Secondary | ICD-10-CM | POA: Diagnosis not present

## 2019-12-31 DIAGNOSIS — Z1211 Encounter for screening for malignant neoplasm of colon: Secondary | ICD-10-CM | POA: Diagnosis not present

## 2019-12-31 DIAGNOSIS — D175 Benign lipomatous neoplasm of intra-abdominal organs: Secondary | ICD-10-CM | POA: Diagnosis not present

## 2020-01-12 ENCOUNTER — Other Ambulatory Visit: Payer: Self-pay

## 2020-01-12 ENCOUNTER — Ambulatory Visit: Payer: Medicare Other | Attending: Internal Medicine | Admitting: Physical Therapy

## 2020-01-12 ENCOUNTER — Encounter: Payer: Self-pay | Admitting: Physical Therapy

## 2020-01-12 DIAGNOSIS — M25662 Stiffness of left knee, not elsewhere classified: Secondary | ICD-10-CM | POA: Diagnosis not present

## 2020-01-12 DIAGNOSIS — M6281 Muscle weakness (generalized): Secondary | ICD-10-CM | POA: Diagnosis not present

## 2020-01-12 DIAGNOSIS — R262 Difficulty in walking, not elsewhere classified: Secondary | ICD-10-CM | POA: Insufficient documentation

## 2020-01-12 DIAGNOSIS — M25562 Pain in left knee: Secondary | ICD-10-CM | POA: Diagnosis not present

## 2020-01-12 NOTE — Therapy (Signed)
[facility] [address], [place], [address] Phone: [phone]   Fax:  [phone]  Physical Therapy Evaluation  Patient Details  Name: Rachael Zimmerman MRN: [id number] Date of Birth: Mar 16, 1961 No data recorded  Encounter Date: 01/12/2020   PT End of Session - 01/12/20 1626    Visit Number 1    Number of Visits 16    Date for PT Re-Evaluation 03/08/20    PT Start Time 0408    PT Stop Time 0500    PT Time Calculation (min) 52 min    Activity Tolerance Patient tolerated treatment well    Behavior During Therapy [facility] for tasks assessed/performed           Past Medical History:  Diagnosis Date   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[place])    s/p craniectomy   Hypertension    Pre-diabetes    patient denies ; see lab result for a1c 03-06-17 is 6.0   Seizures ([place])    [place]; none recently    Weakness of right side of body    d/t brain tumor intervention     Past Surgical History:  Procedure Laterality Date   ABDOMINAL HYSTERECTOMY  2010   ovaries not removed   BRAIN SURGERY     tumor excision s/p craniectomy   BREAST BIOPSY Left 2010   benign   CATARACT EXTRACTION W/PHACO Left 11/07/2016   Procedure: CATARACT EXTRACTION PHACO AND INTRAOCULAR LENS PLACEMENT ([place]);  Surgeon: Estill Cotta, MD;  Location: [facility] ORS;  Service: Ophthalmology;  Laterality: Left;  [place] 00:53.9 AP% 16.7 CDE 17.02 Fluid Pack Lot # [id number]   PARATHYROIDECTOMY Right 03/14/2017   Procedure: RIGHT PARATHYROIDECTOMY;  Surgeon: Armandina Gemma, MD;  Location: [facility];  Service: General;  Laterality: Right;    There were no vitals filed for this visit.    Subjective Assessment - 01/12/20 1617    Pertinent History Pt is a 59 year old female presenting with L knee pain > 6 months. Patient reports benign brain tumor that she had removed in 2003, but that left no motor control/strength of entire R side of body. She went through  PT for this for 4months and that since strength and function has returned, but she aware this side is weaker than the L. She was seeing PT for L knee pain prior to COVID and this was helping, then early 2021 Jan pain came back on insidiously. She reports she has increased pain with STS, prolonged sitting, and is not time of day dependent. Pain is better with motion and walking. Worst pain over the past week 5/10; best: 3/10. Pt works in daycare part time, and enjoys shopping. She reports the knee pain does not bother her much at work but that she does not feel comfortable lifting small children, and has trouble standing from prolonged sitting, like for story time. She feels like she is more tired following a day out d/t general fatigue with decreased activity d/t COVID and knee pain. She would like a regular exercise program. Pt denies N/V, B&B changes, unexplained weight fluctuation, saddle paresthesia, fever, night sweats, or unrelenting night pain at this time.    Limitations House hold activities;Lifting    How long can you sit comfortably? 1 hour    How long can you stand comfortably? unlimited    How long can you walk comfortably? unlimited by knee pain    Diagnostic tests Xray    Patient Stated Goals  Increase strength of RLE and decrease L knee pain    Currently in Pain? Yes    Pain Score 5     Pain Location Knee    Pain Orientation Left    Pain Descriptors / Indicators Aching;Sharp    Pain Type Chronic pain    Pain Radiating Towards None    Pain Onset More than a month ago    Pain Frequency Intermittent    Aggravating Factors  standing from prolonged sitting, prolonged sitting, heavy lifting    Pain Relieving Factors tylenol    Effect of Pain on Daily Activities reading to children             OBJECTIVE  MUSCULOSKELETAL: Tremor: Absent Bulk: Normal Tone: Normal, no spasticity, rigidity, or clonus No trophic changes noted to lower extremities. No ecchymosis, erythema, or edema  noted around knee. No gross knee deformity noted  Posture In standing heavy L WB, R knee hyperext, with pelvic/lumbar rotation to R (L hip ER/R hip IR)  Gait No DF through RLE swing, compensated with LLE vault, no heel strike with foot flat contact. Mild R knee hyperext at R foot flat that increases to severe hyperext during LLE swing and L trendelenberg Gait speed 0.23m/s  Palpation Mild pain to palpation along medial joint line, none lateral joint line of knee. No pain over patellar tendon. No pain with palpation to quadriceps or hamstrings. Some latent trigger points in bilat glutes  Strength R/L 3+/5 Hip flexion 2+/4+ Hip external rotation 3-/4+ Hip internal rotation 2-/4 Hip extension  3-/4 Hip abduction 4-/4+ Hip adduction 4-/5 Knee extension 4-/5 Knee flexion 1/4+ Ankle Dorsiflexion 1/5 Ankle Plantarflexion 1/5 Ankle Inversion 1/5 Ankle Eversion 1/5 GT ext *indicates pain  AROM Knee R/L Flexion: 130/122 Extension: 0/2 *indicates pain  Hip All WNL in gravity eliminated positions, except R hip ext which is 50% limited *indicates pain  Ankle R/L 0/50 Ankle Plantarflexion 0/20 Ankle Dorsiflexion 0/35 Ankle Inversion 0/25 Ankle Eversion *Indicates Pain  PROM  Spasticity of RLE 1+ with knee flex/ext and DF/PF All PROM = AROM except R DF = 10d  Muscle Length Hamstring length: Shortened on RLE, spastic with increased velocity Quad length Pat Patrick): WNL bilat Hip Flexor length Marcello Moores): Shortened on RLE, spastic with increased velocity IT band length Nicoletta Dress): WNL bilat  Passive Accessory Motion Superior Tibiofibular Joint: Min guarding at L Knee: Hypermobility with excessive ant tibial translation at RLE Patella: WNL Ankle: WNL  NEUROLOGICAL:  Mental Status Patient is oriented to person, place and time.  Recent memory is intact.  Remote memory is intact.  Attention span and concentration are intact.  Expressive speech is intact.  Patient's fund of  knowledge is within normal limits for educational level.  Sensation Grossly intact to light touch bilateral LEs as determined by testing dermatomes L2-S2 Mild decrease in proprioception with eyes closed thumb find with RUE  Reflexes R/L 2+/2+ Knee Jerk (L3/4) 2+/2+ Ankle Jerk (S1/2)  VASCULAR Dorsalis pedis and posterior tibial pulses are palpable  SPECIAL TESTS  Ligamentous Stability  Anterior Drawer Test: Negative bilat Lachman Test: Negative bilat Posterior Drawer Test: Negative bilat Posterior Sag Sign: Negative bilat Valgus Stress Test: Negative bilat Varus Stress Test: Negative bilat  Meniscus Tests McMurray Test: Positive on LLE Wilson Compression Test: Negative bilat Pivot-Shift Test: Negative bilat Thessaly Test: Positive on LLE  Functional Mobility Supine <> sit: modI with patient "hooking" RLE with LLE to aid in LE negotiation, heavy use of UEs STS modI with heavy LLE  push and WB Stair ascent: with handrail step to leading with LLE; descent with handrail step to leading with RLE, reports she does not use stairs without handrail Gait: see above 5xSTS 12sec  Ther-Ex PT reviewed the following HEP with patient with patient able to demonstrate a set of the following with min cuing for correction needed. PT educated patient on parameters of therex (how/when to inc/decrease intensity, frequency, rep/set range, stretch hold time, and purpose of therex) with verbalized understanding.  Access Code: [email or web address] Supine Bridge - 1 x daily - 2-3 x weekly - 3 sets - 10 reps Supine Active Straight Leg Raise - 1 x daily - 2-3 x weekly - 3 sets - 10 reps Hooklying Clamshell with Resistance - 1 x daily - 2-3 x weekly - 3 sets - 10 reps Seated Knee Extension with Resistance - 1 x daily - 2-3 x weekly - 3 sets - 10 reps Heavy education on possible bracing needed for RLE to offset burden of LLE in lieu of strength. Education on prognosis of strengthening LLE vs. RLE, with education on  central/neural impairments and how they differentiate from true strength impairments and possible need for accomodation with bracing; pt verbalizes understanding                  Objective measurements completed on examination: See above findings.               PT Education - 01/12/20 1625    Education Details Patient was educated on diagnosis, anatomy and pathology involved, prognosis, role of PT, and was given an HEP, demonstrating exercise with proper form following verbal and tactile cues, and was given a paper hand out to continue exercise at home. Pt was educated on and agreed to plan of care.    Person(s) Educated Patient    Methods Explanation;Demonstration;Verbal cues;Tactile cues;Handout    Comprehension Verbalized understanding;Returned demonstration;Verbal cues required;Tactile cues required            PT Short Term Goals - 01/13/20 1655      PT SHORT TERM GOAL #1   Title Be independent with initial home exercise program for self-management of symptoms.    Baseline 01/12/20    Time 4    Period Weeks    Status New             PT Long Term Goals - 01/13/20 1658      PT LONG TERM GOAL #1   Title Pt will decrease worst pain as reported on NPRS by at least 3 points in order to demonstrate clinically significant reduction in ankle/foot pain.    Baseline 01/12/20 5/10 L knee pain    Time 4    Period Weeks    Status New      PT LONG TERM GOAL #2   Title Patient will demonstrate gross 5/5 L hip strength to demonstrate clinically significant increased strength in order to increase ind with transfers    Baseline 01/12/20 see IE    Time 8    Period Weeks    Status New      PT LONG TERM GOAL #3   Title Pt will decrease 5TSTS by at least 3 seconds in order to demonstrate clinically significant improvement in LE strength    Baseline 01/13/20 12sec    Time 8    Period Weeks    Status New      PT LONG TERM GOAL #4   Title Pt will increase 10MWT by at  least 0.13 m/s in order to demonstrate clinically significant improvement in community ambulation.    Baseline 01/12/20 0.66m/s    Time 8    Period Weeks    Status New      PT LONG TERM GOAL #5   Title Patient will increase FOTO score to  to demonstrate predicted increase in functional mobility to complete ADLs    Baseline 01/12/20 FOTO to be assessed next session    Time 8    Period Weeks    Status New                  Plan - 01/13/20 1247    Clinical Impression Statement Pt is a 59 year old female presenting with acute on chronic L knee pain. Pertinent PMH of benign brain tumor removal in 2003 with remaining R sided motor and sesory deficits (see treatment note). Patient with impairments in gross L hip strength, R sided motor/sensory allowing with LLE compensatory strategies, abnormal gait (see treatment note), and pain. Activity limitations in car transfers, bed transfers, STS, ambulation, and squatting/lifting; inhibiting full participation in ADLs, at her work at a daycare, and community activities. Would benefit from skilled PT to address above deficits and promote optimal return to PLOF.    Personal Factors and Comorbidities Comorbidity 1;Comorbidity 2;Fitness;Past/Current Experience;Profession;Sex    Comorbidities benign brain tumor with remaining deficits, HTN    Examination-Activity Limitations Bed Mobility;Squat;Lift;Stairs;Stand;Transfers    Examination-Participation Restrictions Occupation;Cleaning;Community Activity;Laundry    Stability/Clinical Decision Making Evolving/Moderate complexity    Clinical Decision Making Moderate    Rehab Potential Good    PT Frequency 2x / week    PT Duration 8 weeks    PT Treatment/Interventions Ultrasound;Stair training;Therapeutic exercise;Gait training;Dry needling;Joint Manipulations;Spinal Manipulations;Passive range of motion;Patient/family education;Manual techniques;Orthotic Fit/Training;Neuromuscular re-education;Taping;Therapeutic  activities;DME Instruction;Balance training;Functional mobility training;Moist Heat;Electrical Stimulation;Cryotherapy;ADLs/Self Care Home Management;Aquatic Therapy;Traction    PT Next Visit Plan Possible LLE bracing    PT Home Exercise Plan bridge, SLR, hooklying clamshell, seated knee ext    Recommended Other Services Orthoses (see treatment note)    Consulted and Agree with Plan of Care Patient           Patient will benefit from skilled therapeutic intervention in order to improve the following deficits and impairments:  Abnormal gait, Decreased balance, Decreased endurance, Decreased mobility, Difficulty walking, Increased muscle spasms, Pain, Postural dysfunction, Impaired flexibility, Hypermobility, Decreased coordination, Decreased activity tolerance, Decreased strength, Impaired tone, Improper body mechanics, Decreased range of motion  Visit Diagnosis: Left knee pain, unspecified chronicity  Muscle weakness (generalized)  Stiffness of left knee, not elsewhere classified  Difficulty in walking, not elsewhere classified     Problem List Patient Active Problem List   Diagnosis Date Noted   Left knee pain 11/18/2019   Colon cancer screening 01/25/2018   Hyperparathyroidism, primary ([place]) 03/11/2017   Parathyroid adenoma 12/04/2016   History of ankle fracture 11/04/2016   Health care maintenance 08/22/2015   Hyperglycemia 08/10/2015   Hot flashes 02/14/2015   Skin lesion of left arm 02/14/2015   Gait disturbance 07/11/2014   Right knee pain 12/29/2013   Essential hypertension, benign 02/08/2013   History of seizure disorder 09/28/2012   History of brain tumor 09/11/2012   Durwin Reges DPT Durwin Reges 01/13/2020, 5:03 PM  [place] [facility] [address] [place], [address] Phone: [phone]   Fax:  [phone]  Name: Rachael Zimmerman MRN: [id number] Date of Birth:  08-22-1960

## 2020-01-14 ENCOUNTER — Encounter: Payer: Medicare Other | Admitting: Physical Therapy

## 2020-01-19 ENCOUNTER — Ambulatory Visit: Payer: Medicare Other | Admitting: Physical Therapy

## 2020-01-19 ENCOUNTER — Other Ambulatory Visit: Payer: Self-pay

## 2020-01-19 ENCOUNTER — Encounter: Payer: Self-pay | Admitting: Physical Therapy

## 2020-01-19 DIAGNOSIS — M25562 Pain in left knee: Secondary | ICD-10-CM | POA: Diagnosis not present

## 2020-01-19 DIAGNOSIS — M6281 Muscle weakness (generalized): Secondary | ICD-10-CM | POA: Diagnosis not present

## 2020-01-19 DIAGNOSIS — M25662 Stiffness of left knee, not elsewhere classified: Secondary | ICD-10-CM | POA: Diagnosis not present

## 2020-01-19 DIAGNOSIS — R262 Difficulty in walking, not elsewhere classified: Secondary | ICD-10-CM | POA: Diagnosis not present

## 2020-01-19 NOTE — Therapy (Signed)
[facility] [address] [place], [address] Phone: [phone]   Fax:  [phone]  Physical Therapy Treatment  Patient Details  Name: Rachael Zimmerman MRN: [id number] Date of Birth: [birth year] No data recorded  Encounter Date: 01/19/2020   PT End of Session - 01/19/20 1125    Visit Number 2    Number of Visits 16    Date for PT Re-Evaluation 03/08/20    PT Start Time 1120    PT Stop Time 1158    PT Time Calculation (min) 38 min    Activity Tolerance Patient tolerated treatment well    Behavior During Therapy [facility] for tasks assessed/performed           Past Medical History:  Diagnosis Date  . 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 Hypertension   . Pre-diabetes    patient denies ; see lab result for a1c 03-06-17 is 6.0  . Seizures ([place])    AFTER BRAIN SURGERY; none recently   . Weakness of right side of body    d/t brain tumor intervention     Past Surgical History:  Procedure Laterality Date  . ABDOMINAL HYSTERECTOMY  2010   ovaries not removed  . BRAIN SURGERY     tumor excision s/p craniectomy  . BREAST BIOPSY Left 2010   benign  . CATARACT EXTRACTION W/PHACO Left 11/07/2016   Procedure: CATARACT EXTRACTION PHACO AND INTRAOCULAR LENS PLACEMENT ([facility]);  Surgeon: Estill Cotta, MD;  Location: [facility] ORS;  Service: Ophthalmology;  Laterality: Left;  [place] 00:53.9 AP% 16.7 CDE 17.02 Fluid Pack Lot # [id number]  . PARATHYROIDECTOMY Right 03/14/2017   Procedure: RIGHT PARATHYROIDECTOMY;  Surgeon: Armandina Gemma, MD;  Location: [facility];  Service: General;  Laterality: Right;    There were no vitals filed for this visit.   Subjective Assessment - 01/19/20 1123    Subjective Patient reports she is dong well overall. Has been completing HEP, though she admits she did not do too much on the weekend. Reports no pain today.    Pertinent History Pt is a 59 year old female presenting with L knee  pain > 6 months. Patient reports benign brain tumor that she had removed in 2003, but that left no motor control/strength of entire R side of body. She went through PT for this for 75months and that since strength and function has returned, but she aware this side is weaker than the L. She was seeing PT for L knee pain prior to COVID and this was helping, then early 2021 Jan pain came back on insidiously. She reports she has increased pain with STS, prolonged sitting, and is not time of day dependent. Pain is better with motion and walking. Worst pain over the past week 5/10; best: 3/10. Pt works in daycare part time, and enjoys shopping. She reports the knee pain does not bother her much at work but that she does not feel comfortable lifting small children, and has trouble standing from prolonged sitting, like for story time. She feels like she is more tired following a day out d/t general fatigue with decreased activity d/t COVID and knee pain. She would like a regular exercise program. Pt denies N/V, B&B changes, unexplained weight fluctuation, saddle paresthesia, fever, night sweats, or unrelenting night pain at this time.    Limitations House hold activities;Lifting    How long can you sit comfortably? 1 hour    How long  can you stand comfortably? unlimited    How long can you walk comfortably? unlimited by knee pain    Diagnostic tests Xray    Patient Stated Goals Increase strength of RLE and decrease L knee pain    Pain Onset More than a month ago           Ther-Ex Nustep L3 3mins min cuing to maintain SPM over 70 L knee extRTB x10; GTB 2x 10; R GTB x10 BlutTB x10 with min cuing for eccent Bridge x10; with GTB 2x 10 with cuing to prevent L knee drift with decent carry over Prone L hip ext x10; alt hip ext 2x 10 (each) with cuing to prevent rotation compensation  Sidelying L hip abd 3x 6/8 with heavy cuing for technique without compensation with some carry over Attempted DF in sitting, trace  activation of R side   Gait Training:  Slomo video used to ensure patient understanding of current gait mechanics and need for foot clearance through DF and prevention of RLE hyperext in RLE terminal stance. Patient able to somewhat correct RLE hyperext, PT used theraband to brace R ankle into DF, which patient reports makes her feel like she is putting less wt on LLE. Attempted on treadmill with PT using manual cuing to prevent R knee hyperext 24mins. Following completed 238ft in gym with improvement of L hip hiking with braced R ankle in DF, but maintained R knee hyperext and trendelenberg in R stance.  Education on bracing to help with this                     PT Education - 01/19/20 1124    Education Details HEP review, therex progression/technique    Person(s) Educated Patient    Methods Explanation;Verbal cues;Demonstration    Comprehension Verbalized understanding;Verbal cues required;Returned demonstration            PT Short Term Goals - 01/13/20 1655      PT SHORT TERM GOAL #1   Title Be independent with initial home exercise program for self-management of symptoms.    Baseline 01/12/20    Time 4    Period Weeks    Status New             PT Long Term Goals - 01/13/20 1658      PT LONG TERM GOAL #1   Title Pt will decrease worst pain as reported on NPRS by at least 3 points in order to demonstrate clinically significant reduction in ankle/foot pain.    Baseline 01/12/20 5/10 L knee pain    Time 4    Period Weeks    Status New      PT LONG TERM GOAL #2   Title Patient will demonstrate gross 5/5 L hip strength to demonstrate clinically significant increased strength in order to increase ind with transfers    Baseline 01/12/20 see IE    Time 8    Period Weeks    Status New      PT LONG TERM GOAL #3   Title Pt will decrease 5TSTS by at least 3 seconds in order to demonstrate clinically significant improvement in LE strength    Baseline 01/13/20 12sec     Time 8    Period Weeks    Status New      PT LONG TERM GOAL #4   Title Pt will increase 10MWT by at least 0.13 m/s in order to demonstrate clinically significant improvement in community ambulation.  Baseline 01/12/20 0.74m/s    Time 8    Period Weeks    Status New      PT LONG TERM GOAL #5   Title Patient will increase FOTO score to  to demonstrate predicted increase in functional mobility to complete ADLs    Baseline 01/12/20 FOTO to be assessed next session    Time 8    Period Weeks    Status New                 Plan - 01/19/20 1333    Clinical Impression Statement PT reviewed HPE therex and progressed therex and ROM, with focus on RLE to decrease LLE WB burden. Pt is motivated throguhout session and is able to complete all therex with paroper technique following some cuing and encouragement. Patient is able to initiate R hip ad knee strengthening with good success, but demonstrates only trace ankle motion activation. PT discussed heavily prospect of brace at least for RLE foot clearance in lieu of active DF through RLE swing, with LLE hip hiking compensation. PT attempted to cue patient out of R knee hyperext in R terminal stance, which she is able to understand, but is unable to prevent in walking, though she demonstrates better carry over in standing. PT discussed possible need/benefit for bracing at knee to prevent this as well. Patient verbalizes understanding of all education provided. PT will continue progression as able.    Personal Factors and Comorbidities Comorbidity 1;Comorbidity 2;Fitness;Past/Current Experience;Profession;Sex    Comorbidities benign brain tumor with remaining deficits, HTN    Examination-Activity Limitations Bed Mobility;Squat;Lift;Stairs;Stand;Transfers    Examination-Participation Restrictions Occupation;Cleaning;Community Activity;Laundry    Stability/Clinical Decision Making Evolving/Moderate complexity    Clinical Decision Making Moderate     Rehab Potential Good    PT Frequency 2x / week    PT Duration 8 weeks    PT Treatment/Interventions Ultrasound;Stair training;Therapeutic exercise;Gait training;Dry needling;Joint Manipulations;Spinal Manipulations;Passive range of motion;Patient/family education;Manual techniques;Orthotic Fit/Training;Neuromuscular re-education;Taping;Therapeutic activities;DME Instruction;Balance training;Functional mobility training;Moist Heat;Electrical Stimulation;Cryotherapy;ADLs/Self Care Home Management;Aquatic Therapy;Traction    PT Next Visit Plan Possible LLE bracing    PT Home Exercise Plan bridge, SLR, hooklying clamshell, seated knee ext    Consulted and Agree with Plan of Care Patient           Patient will benefit from skilled therapeutic intervention in order to improve the following deficits and impairments:  Abnormal gait, Decreased balance, Decreased endurance, Decreased mobility, Difficulty walking, Increased muscle spasms, Pain, Postural dysfunction, Impaired flexibility, Hypermobility, Decreased coordination, Decreased activity tolerance, Decreased strength, Impaired tone, Improper body mechanics, Decreased range of motion  Visit Diagnosis: Left knee pain, unspecified chronicity  Muscle weakness (generalized)  Stiffness of left knee, not elsewhere classified  Difficulty in walking, not elsewhere classified     Problem List Patient Active Problem List   Diagnosis Date Noted  . Left knee pain 11/18/2019  . Colon cancer screening 01/25/2018  . Hyperparathyroidism, primary ([place]) 03/11/2017  . 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 History of ankle fracture 11/04/2016  . Health care maintenance 08/22/2015  . Hyperglycemia 08/10/2015  . Hot flashes 02/14/2015  . Skin lesion of left arm 02/14/2015  . Gait disturbance 07/11/2014  . Right knee pain 12/29/2013  . Essential hypertension, benign 02/08/2013  . History of seizure disorder 09/28/2012  . History of brain tumor 09/11/2012    Durwin Reges DPT Durwin Reges 01/19/2020, 1:55 PM  [facility] [address], [place], [address] Phone:  [phone]   Fax:  [phone]  Name: ERYNN VACA MRN: [id number] Date of Birth: Apr 20, 1961

## 2020-01-21 ENCOUNTER — Ambulatory Visit: Payer: Medicare Other | Admitting: Physical Therapy

## 2020-01-26 ENCOUNTER — Ambulatory Visit: Payer: Medicare Other | Admitting: Physical Therapy

## 2020-01-26 ENCOUNTER — Encounter: Payer: Self-pay | Admitting: Physical Therapy

## 2020-01-26 ENCOUNTER — Other Ambulatory Visit: Payer: Self-pay

## 2020-01-26 DIAGNOSIS — M6281 Muscle weakness (generalized): Secondary | ICD-10-CM

## 2020-01-26 DIAGNOSIS — M25562 Pain in left knee: Secondary | ICD-10-CM | POA: Diagnosis not present

## 2020-01-26 DIAGNOSIS — R262 Difficulty in walking, not elsewhere classified: Secondary | ICD-10-CM

## 2020-01-26 DIAGNOSIS — M25662 Stiffness of left knee, not elsewhere classified: Secondary | ICD-10-CM | POA: Diagnosis not present

## 2020-01-26 NOTE — Therapy (Signed)
[facility] [facility] [address], [place], [address] Phone: [phone]   Fax:  [phone]  Physical Therapy Treatment  Patient Details  Name: Rachael Zimmerman MRN: [id number] Date of Birth: 1961/05/11 No data recorded  Encounter Date: 01/26/2020   PT End of Session - 01/26/20 1419    Visit Number 3    Number of Visits 16    Date for PT Re-Evaluation 03/08/20    PT Start Time 0155    PT Stop Time 0233    PT Time Calculation (min) 38 min    Activity Tolerance Patient tolerated treatment well    Behavior During Therapy [facility] for tasks assessed/performed           Past Medical History:  Diagnosis Date  . 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 Hypertension   . Pre-diabetes    patient denies ; see lab result for a1c 03-06-17 is 6.0  . Seizures ([place])    AFTER BRAIN SURGERY; none recently   . Weakness of right side of body    d/t brain tumor intervention     Past Surgical History:  Procedure Laterality Date  . ABDOMINAL HYSTERECTOMY  2010   ovaries not removed  . BRAIN SURGERY     tumor excision s/p craniectomy  . BREAST BIOPSY Left 2010   benign  . CATARACT EXTRACTION W/PHACO Left 11/07/2016   Procedure: CATARACT EXTRACTION PHACO AND INTRAOCULAR LENS PLACEMENT (IOC);  Surgeon: Estill Cotta, MD;  Location: [facility] ORS;  Service: Ophthalmology;  Laterality: Left;  [place] 00:53.9 AP% 16.7 CDE 17.02 Fluid Pack Lot # [id number]  . PARATHYROIDECTOMY Right 03/14/2017   Procedure: RIGHT PARATHYROIDECTOMY;  Surgeon: Armandina Gemma, MD;  Location: [facility];  Service: General;  Laterality: Right;    There were no vitals filed for this visit.   Subjective Assessment - 01/26/20 1402    Subjective Patient reports she is doing well since last session, she was a little sore, but this did not last long. REports 7/10 L knee pain today. Patient reports she attempted change gait mechanics. Compliance with HEP.     Pertinent History Pt is a 59 year old female presenting with L knee pain > 6 months. Patient reports benign brain tumor that she had removed in 2003, but that left no motor control/strength of entire R side of body. She went through PT for this for 106months and that since strength and function has returned, but she aware this side is weaker than the L. She was seeing PT for L knee pain prior to COVID and this was helping, then early 2021 Jan pain came back on insidiously. She reports she has increased pain with STS, prolonged sitting, and is not time of day dependent. Pain is better with motion and walking. Worst pain over the past week 5/10; best: 3/10. Pt works in daycare part time, and enjoys shopping. She reports the knee pain does not bother her much at work but that she does not feel comfortable lifting small children, and has trouble standing from prolonged sitting, like for story time. She feels like she is more tired following a day out d/t general fatigue with decreased activity d/t COVID and knee pain. She would like a regular exercise program. Pt denies N/V, B&B changes, unexplained weight fluctuation, saddle paresthesia, fever, night sweats, or unrelenting night pain at this time.    Limitations House hold activities;Lifting    How long can you  sit comfortably? 1 hour    How long can you stand comfortably? unlimited    How long can you walk comfortably? unlimited by knee pain    Diagnostic tests Xray    Patient Stated Goals Increase strength of RLE and decrease L knee pain    Pain Onset More than a month ago              Ther-Ex Nustep L3 73mins min cuing to maintain SPM over 70 Mini squat with elevated mat table x15 with cuing for proper technique without "excessive knees over toes" for hip ext activation; with RLE on balance stone 2x 10 with bilat HHA with cuing for technique, difficulty preventing R knee valgus LLE SL bridge 3x 6 with difficulty holding RLE in ext, 50% full ROM lift  of LLE, good effort throughout Prone alt hip ext x10; with knee flex 2x 10 with difficulty maintaining knee flex on RLE, difficult on LLE d/t fatigue, good effort throughout Sidelying L hip abd 2x 10; with 3# AW RLE 2x 10; with difficulty preventing hip flex with RLE lift d/t hip flex tension Thomas stretch 60sec bilat increased stretch felt on RLE                    PT Education - 01/26/20 1418    Education Details therex progression/technique    Person(s) Educated Patient    Methods Explanation;Demonstration;Verbal cues    Comprehension Verbalized understanding;Returned demonstration;Verbal cues required            PT Short Term Goals - 01/13/20 1655      PT SHORT TERM GOAL #1   Title Be independent with initial home exercise program for self-management of symptoms.    Baseline 01/12/20    Time 4    Period Weeks    Status New             PT Long Term Goals - 01/13/20 1658      PT LONG TERM GOAL #1   Title Pt will decrease worst pain as reported on NPRS by at least 3 points in order to demonstrate clinically significant reduction in ankle/foot pain.    Baseline 01/12/20 5/10 L knee pain    Time 4    Period Weeks    Status New      PT LONG TERM GOAL #2   Title Patient will demonstrate gross 5/5 L hip strength to demonstrate clinically significant increased strength in order to increase ind with transfers    Baseline 01/12/20 see IE    Time 8    Period Weeks    Status New      PT LONG TERM GOAL #3   Title Pt will decrease 5TSTS by at least 3 seconds in order to demonstrate clinically significant improvement in LE strength    Baseline 01/13/20 12sec    Time 8    Period Weeks    Status New      PT LONG TERM GOAL #4   Title Pt will increase 10MWT by at least 0.13 m/s in order to demonstrate clinically significant improvement in community ambulation.    Baseline 01/12/20 0.76m/s    Time 8    Period Weeks    Status New      PT LONG TERM GOAL #5   Title  Patient will increase FOTO score to  to demonstrate predicted increase in functional mobility to complete ADLs    Baseline 01/12/20 FOTO to be assessed next session  Time 8    Period Weeks    Status New                 Plan - 01/26/20 1441    Clinical Impression Statement PT continued therex progression for increased hip and core strength with success. Patient is able to comply with all cuing for proper technique of therex with good motivation throughout session, noted muscle fatigue. PT will continue progression as able    Personal Factors and Comorbidities Comorbidity 1;Comorbidity 2;Fitness;Past/Current Experience;Profession;Sex    Comorbidities benign brain tumor with remaining deficits, HTN    Examination-Activity Limitations Bed Mobility;Squat;Lift;Stairs;Stand;Transfers    Examination-Participation Restrictions Occupation;Cleaning;Community Activity;Laundry    Stability/Clinical Decision Making Evolving/Moderate complexity    Clinical Decision Making Moderate    Rehab Potential Good    PT Frequency 2x / week    PT Duration 8 weeks    PT Treatment/Interventions Ultrasound;Stair training;Therapeutic exercise;Gait training;Dry needling;Joint Manipulations;Spinal Manipulations;Passive range of motion;Patient/family education;Manual techniques;Orthotic Fit/Training;Neuromuscular re-education;Taping;Therapeutic activities;DME Instruction;Balance training;Functional mobility training;Moist Heat;Electrical Stimulation;Cryotherapy;ADLs/Self Care Home Management;Aquatic Therapy;Traction    PT Next Visit Plan Possible LLE bracing    PT Home Exercise Plan bridge, SLR, hooklying clamshell, seated knee ext    Consulted and Agree with Plan of Care Patient           Patient will benefit from skilled therapeutic intervention in order to improve the following deficits and impairments:  Abnormal gait, Decreased balance, Decreased endurance, Decreased mobility, Difficulty walking, Increased  muscle spasms, Pain, Postural dysfunction, Impaired flexibility, Hypermobility, Decreased coordination, Decreased activity tolerance, Decreased strength, Impaired tone, Improper body mechanics, Decreased range of motion  Visit Diagnosis: Left knee pain, unspecified chronicity  Muscle weakness (generalized)  Stiffness of left knee, not elsewhere classified  Difficulty in walking, not elsewhere classified     Problem List Patient Active Problem List   Diagnosis Date Noted  . Left knee pain 11/18/2019  . Colon cancer screening 01/25/2018  . Hyperparathyroidism, primary ([place]) 03/11/2017  . 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 History of ankle fracture 11/04/2016  . Health care maintenance 08/22/2015  . Hyperglycemia 08/10/2015  . Hot flashes 02/14/2015  . Skin lesion of left arm 02/14/2015  . Gait disturbance 07/11/2014  . Right knee pain 12/29/2013  . Essential hypertension, benign 02/08/2013  . History of seizure disorder 09/28/2012  . History of brain tumor 09/11/2012   Durwin Reges DPT Durwin Reges 01/26/2020, 2:47 PM  [place] [facility] [address], [place], [address] Phone: [phone]   Fax:  [phone]  Name: Rachael Zimmerman MRN: [id number] Date of Birth: April 29, 1961

## 2020-01-28 ENCOUNTER — Encounter: Payer: Self-pay | Admitting: Physical Therapy

## 2020-01-28 ENCOUNTER — Ambulatory Visit: Payer: Medicare Other | Admitting: Physical Therapy

## 2020-01-28 ENCOUNTER — Other Ambulatory Visit: Payer: Self-pay

## 2020-01-28 DIAGNOSIS — M6281 Muscle weakness (generalized): Secondary | ICD-10-CM

## 2020-01-28 DIAGNOSIS — M25662 Stiffness of left knee, not elsewhere classified: Secondary | ICD-10-CM

## 2020-01-28 DIAGNOSIS — R262 Difficulty in walking, not elsewhere classified: Secondary | ICD-10-CM | POA: Diagnosis not present

## 2020-01-28 DIAGNOSIS — M25562 Pain in left knee: Secondary | ICD-10-CM

## 2020-01-28 NOTE — Therapy (Signed)
[place] [facility] [address] [place], [address] Phone: [phone]   Fax:  [phone]  Physical Therapy Treatment  Patient Details  Name: Rachael Zimmerman MRN: [id number] Date of Birth: [birth year] No data recorded  Encounter Date: 01/28/2020   PT End of Session - 01/28/20 1702    Visit Number 4    Number of Visits 16    Date for PT Re-Evaluation 03/08/20    PT Start Time 0455    PT Stop Time 0533    PT Time Calculation (min) 38 min    Activity Tolerance Patient tolerated treatment well    Behavior During Therapy [facility] for tasks assessed/performed           Past Medical History:  Diagnosis Date  . 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 Hypertension   . Pre-diabetes    patient denies ; see lab result for a1c 03-06-17 is 6.0  . Seizures ([place])    AFTER BRAIN SURGERY; none recently   . Weakness of right side of body    d/t brain tumor intervention     Past Surgical History:  Procedure Laterality Date  . ABDOMINAL HYSTERECTOMY  2010   ovaries not removed  . BRAIN SURGERY     tumor excision s/p craniectomy  . BREAST BIOPSY Left 2010   benign  . CATARACT EXTRACTION W/PHACO Left 11/07/2016   Procedure: CATARACT EXTRACTION PHACO AND INTRAOCULAR LENS PLACEMENT (IOC);  Surgeon: Estill Cotta, MD;  Location: [facility] ORS;  Service: Ophthalmology;  Laterality: Left;  [place] 00:53.9 AP% 16.7 CDE 17.02 Fluid Pack Lot # [id number]  . PARATHYROIDECTOMY Right 03/14/2017   Procedure: RIGHT PARATHYROIDECTOMY;  Surgeon: Armandina Gemma, MD;  Location: [facility];  Service: General;  Laterality: Right;    There were no vitals filed for this visit.   Subjective Assessment - 01/28/20 1700    Subjective Patient reports she has not yet gotten a brace, but plans to look tomorrow. She had some soreness following last session, but reports this subsided and she is not having any pain today.    Pertinent History Pt is a 59 year  old female presenting with L knee pain > 6 months. Patient reports benign brain tumor that she had removed in 2003, but that left no motor control/strength of entire R side of body. She went through PT for this for 30months and that since strength and function has returned, but she aware this side is weaker than the L. She was seeing PT for L knee pain prior to COVID and this was helping, then early 2021 Jan pain came back on insidiously. She reports she has increased pain with STS, prolonged sitting, and is not time of day dependent. Pain is better with motion and walking. Worst pain over the past week 5/10; best: 3/10. Pt works in daycare part time, and enjoys shopping. She reports the knee pain does not bother her much at work but that she does not feel comfortable lifting small children, and has trouble standing from prolonged sitting, like for story time. She feels like she is more tired following a day out d/t general fatigue with decreased activity d/t COVID and knee pain. She would like a regular exercise program. Pt denies N/V, B&B changes, unexplained weight fluctuation, saddle paresthesia, fever, night sweats, or unrelenting night pain at this time.    Limitations House hold activities;Lifting    How long can you sit comfortably? 1  hour    How long can you stand comfortably? unlimited    How long can you walk comfortably? unlimited by knee pain    Diagnostic tests Xray    Patient Stated Goals Increase strength of RLE and decrease L knee pain    Pain Onset More than a month ago             Ther-Ex Nustep L3 105mins min cuing to maintain SPM over 70 Attempted SL bridge, patient unable Bridge x10; with GTB for abd activation 2x 10 with good carry over of cuing for glute activation Prone alt hip ext with knees flex x10; alt supermans 2x 10 with min cuing for proper technique with good carry over Side stepping 55ftR 1ftL; YTB x2 trials with cuing for foot clearance with good carry over Mini  squat with elevated mat table 3 x12 with cuing needed for even hip ext to stand and preventing excessive "knees             PT Education - 01/28/20 1702    Education Details therex technique/form    Person(s) Educated Patient    Methods Explanation;Demonstration;Verbal cues    Comprehension Verbalized understanding;Returned demonstration;Verbal cues required            PT Short Term Goals - 01/13/20 1655      PT SHORT TERM GOAL #1   Title Be independent with initial home exercise program for self-management of symptoms.    Baseline 01/12/20    Time 4    Period Weeks    Status New             PT Long Term Goals - 01/13/20 1658      PT LONG TERM GOAL #1   Title Pt will decrease worst pain as reported on NPRS by at least 3 points in order to demonstrate clinically significant reduction in ankle/foot pain.    Baseline 01/12/20 5/10 L knee pain    Time 4    Period Weeks    Status New      PT LONG TERM GOAL #2   Title Patient will demonstrate gross 5/5 L hip strength to demonstrate clinically significant increased strength in order to increase ind with transfers    Baseline 01/12/20 see IE    Time 8    Period Weeks    Status New      PT LONG TERM GOAL #3   Title Pt will decrease 5TSTS by at least 3 seconds in order to demonstrate clinically significant improvement in LE strength    Baseline 01/13/20 12sec    Time 8    Period Weeks    Status New      PT LONG TERM GOAL #4   Title Pt will increase 10MWT by at least 0.13 m/s in order to demonstrate clinically significant improvement in community ambulation.    Baseline 01/12/20 0.62m/s    Time 8    Period Weeks    Status New      PT LONG TERM GOAL #5   Title Patient will increase FOTO score to  to demonstrate predicted increase in functional mobility to complete ADLs    Baseline 01/12/20 FOTO to be assessed next session    Time 8    Period Weeks    Status New                 Plan - 01/28/20 1710    Clinical  Impression Statement PT continued therex progression for increased BLE  strength and motor control with carry over into funcitonal movements with good success. patient is fatigue following last session, requiring some modifications and increased rest breaks, but is able to complete all planned therex. Patient complies with cuing for proper techniques with good motivation throughout session and no increased pain. PT will ocnitnue progression as able.    Personal Factors and Comorbidities Comorbidity 1;Comorbidity 2;Fitness;Past/Current Experience;Profession;Sex    Comorbidities benign brain tumor with remaining deficits, HTN    Examination-Activity Limitations Bed Mobility;Squat;Lift;Stairs;Stand;Transfers    Examination-Participation Restrictions Occupation;Cleaning;Community Activity;Laundry    Stability/Clinical Decision Making Evolving/Moderate complexity    Clinical Decision Making Moderate    Rehab Potential Good    PT Frequency 2x / week    PT Duration 8 weeks    PT Treatment/Interventions Ultrasound;Stair training;Therapeutic exercise;Gait training;Dry needling;Joint Manipulations;Spinal Manipulations;Passive range of motion;Patient/family education;Manual techniques;Orthotic Fit/Training;Neuromuscular re-education;Taping;Therapeutic activities;DME Instruction;Balance training;Functional mobility training;Moist Heat;Electrical Stimulation;Cryotherapy;ADLs/Self Care Home Management;Aquatic Therapy;Traction    PT Next Visit Plan Possible LLE bracing    PT Home Exercise Plan bridge, SLR, hooklying clamshell, seated knee ext    Consulted and Agree with Plan of Care Patient           Patient will benefit from skilled therapeutic intervention in order to improve the following deficits and impairments:  Abnormal gait, Decreased balance, Decreased endurance, Decreased mobility, Difficulty walking, Increased muscle spasms, Pain, Postural dysfunction, Impaired flexibility, Hypermobility, Decreased  coordination, Decreased activity tolerance, Decreased strength, Impaired tone, Improper body mechanics, Decreased range of motion  Visit Diagnosis: Left knee pain, unspecified chronicity  Muscle weakness (generalized)  Stiffness of left knee, not elsewhere classified  Difficulty in walking, not elsewhere classified     Problem List Patient Active Problem List   Diagnosis Date Noted  . Left knee pain 11/18/2019  . Colon cancer screening 01/25/2018  . Hyperparathyroidism, primary ([place]) 03/11/2017  . 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 History of ankle fracture 11/04/2016  . Health care maintenance 08/22/2015  . Hyperglycemia 08/10/2015  . Hot flashes 02/14/2015  . Skin lesion of left arm 02/14/2015  . Gait disturbance 07/11/2014  . Right knee pain 12/29/2013  . Essential hypertension, benign 02/08/2013  . History of seizure disorder 09/28/2012  . History of brain tumor 09/11/2012   Durwin Reges DPT Durwin Reges 01/28/2020, 5:32 PM  [place] [address], [place], [address] Phone: [phone]   Fax:  [phone]  Name: SABIRIN BARAY MRN: [id number] Date of Birth: Feb 05, 1961

## 2020-02-02 ENCOUNTER — Ambulatory Visit: Payer: Medicare Other | Admitting: Physical Therapy

## 2020-02-03 ENCOUNTER — Encounter: Payer: Medicare Other | Admitting: Physical Therapy

## 2020-02-04 ENCOUNTER — Other Ambulatory Visit: Payer: Self-pay

## 2020-02-04 ENCOUNTER — Encounter: Payer: Self-pay | Admitting: Physical Therapy

## 2020-02-04 ENCOUNTER — Ambulatory Visit: Payer: Medicare Other | Admitting: Physical Therapy

## 2020-02-04 DIAGNOSIS — M25562 Pain in left knee: Secondary | ICD-10-CM

## 2020-02-04 DIAGNOSIS — R262 Difficulty in walking, not elsewhere classified: Secondary | ICD-10-CM | POA: Diagnosis not present

## 2020-02-04 DIAGNOSIS — M6281 Muscle weakness (generalized): Secondary | ICD-10-CM

## 2020-02-04 DIAGNOSIS — M25662 Stiffness of left knee, not elsewhere classified: Secondary | ICD-10-CM | POA: Diagnosis not present

## 2020-02-04 NOTE — Therapy (Signed)
[facility] [address], [place], [address] Phone: [phone]   Fax:  [phone]  Physical Therapy Treatment  Patient Details  Name: Rachael Zimmerman MRN: [id number] Date of Birth: Aug 29, 1960 No data recorded  Encounter Date: 02/04/2020   PT End of Session - 02/04/20 1401    Visit Number 5    Number of Visits 16    Date for PT Re-Evaluation 03/08/20    PT Start Time 0148    PT Stop Time 0230    PT Time Calculation (min) 42 min    Activity Tolerance Patient tolerated treatment well    Behavior During Therapy [facility] for tasks assessed/performed           Past Medical History:  Diagnosis Date  . 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 Hypertension   . Pre-diabetes    patient denies ; see lab result for a1c 03-06-17 is 6.0  . Seizures ([place])    AFTER BRAIN SURGERY; none recently   . Weakness of right side of body    d/t brain tumor intervention     Past Surgical History:  Procedure Laterality Date  . ABDOMINAL HYSTERECTOMY  2010   ovaries not removed  . BRAIN SURGERY     tumor excision s/p craniectomy  . BREAST BIOPSY Left 2010   benign  . CATARACT EXTRACTION W/PHACO Left 11/07/2016   Procedure: CATARACT EXTRACTION PHACO AND INTRAOCULAR LENS PLACEMENT (IOC);  Surgeon: Estill Cotta, MD;  Location: [facility] ORS;  Service: Ophthalmology;  Laterality: Left;  [place] 00:53.9 AP% 16.7 CDE 17.02 Fluid Pack Lot # [id number]  . PARATHYROIDECTOMY Right 03/14/2017   Procedure: RIGHT PARATHYROIDECTOMY;  Surgeon: Armandina Gemma, MD;  Location: [facility];  Service: General;  Laterality: Right;    There were no vitals filed for this visit.   Subjective Assessment - 02/04/20 1353    Subjective Patient reports decreased pain since last session, and no pain today, which she is happy with. Reports some compliance with HEP.  Has not gotten time to look into a brace yet.    Pertinent History Pt is a 59 year old  female presenting with L knee pain > 6 months. Patient reports benign brain tumor that she had removed in 2003, but that left no motor control/strength of entire R side of body. She went through PT for this for 2months and that since strength and function has returned, but she aware this side is weaker than the L. She was seeing PT for L knee pain prior to COVID and this was helping, then early 2021 Jan pain came back on insidiously. She reports she has increased pain with STS, prolonged sitting, and is not time of day dependent. Pain is better with motion and walking. Worst pain over the past week 5/10; best: 3/10. Pt works in daycare part time, and enjoys shopping. She reports the knee pain does not bother her much at work but that she does not feel comfortable lifting small children, and has trouble standing from prolonged sitting, like for story time. She feels like she is more tired following a day out d/t general fatigue with decreased activity d/t COVID and knee pain. She would like a regular exercise program. Pt denies N/V, B&B changes, unexplained weight fluctuation, saddle paresthesia, fever, night sweats, or unrelenting night pain at this time.    Limitations House hold activities;Lifting    How long can you sit comfortably? 1 hour  How long can you stand comfortably? unlimited    How long can you walk comfortably? unlimited by knee pain    Diagnostic tests Xray    Patient Stated Goals Increase strength of RLE and decrease L knee pain    Pain Onset More than a month ago            Ther-Ex Nustep L3 77mins min cuing to maintain SPM over 70 L SLR 3x  10 with cuing to prevent adduction and maintain knee ext with some carry over Qped alt hip ext 2x 8/6 with guarding on R side, with patient able to comply with cuing for glute contraction. Fatigued following, modified alt supermans x10 with min cuing for proper technique with good carry over  NeuroMuscular Re-Ed  Runners L step up onto 6in  step (with RLE hip + knee flex) 3x 12 with TC for RLE movement without lateral whip with decent carry over RLE cone tap onto 6in cone 2x 12 with cuing to prevent lateral whip with great carry over Treadmill walking 46mins; walking in gym 49mins with decent carry over of preventing lateral whip/R foot eversion, TC used on treadmill and decent carry over to prevent narrow BOS     PT Education - 02/04/20 1354    Education Details therex technique/form    Person(s) Educated Patient    Methods Explanation;Demonstration;Verbal cues    Comprehension Verbalized understanding;Returned demonstration;Verbal cues required            PT Short Term Goals - 01/13/20 1655      PT SHORT TERM GOAL #1   Title Be independent with initial home exercise program for self-management of symptoms.    Baseline 01/12/20    Time 4    Period Weeks    Status New             PT Long Term Goals - 01/13/20 1658      PT LONG TERM GOAL #1   Title Pt will decrease worst pain as reported on NPRS by at least 3 points in order to demonstrate clinically significant reduction in ankle/foot pain.    Baseline 01/12/20 5/10 L knee pain    Time 4    Period Weeks    Status New      PT LONG TERM GOAL #2   Title Patient will demonstrate gross 5/5 L hip strength to demonstrate clinically significant increased strength in order to increase ind with transfers    Baseline 01/12/20 see IE    Time 8    Period Weeks    Status New      PT LONG TERM GOAL #3   Title Pt will decrease 5TSTS by at least 3 seconds in order to demonstrate clinically significant improvement in LE strength    Baseline 01/13/20 12sec    Time 8    Period Weeks    Status New      PT LONG TERM GOAL #4   Title Pt will increase 10MWT by at least 0.13 m/s in order to demonstrate clinically significant improvement in community ambulation.    Baseline 01/12/20 0.53m/s    Time 8    Period Weeks    Status New      PT LONG TERM GOAL #5   Title Patient will  increase FOTO score to  to demonstrate predicted increase in functional mobility to complete ADLs    Baseline 01/12/20 FOTO to be assessed next session    Time 8    Period Weeks  Status New                 Plan - 02/04/20 1532    Clinical Impression Statement PT continued therex progression for increased BLE strength and motor control with carry over into gait mechanics. Patient is able to comply with cuing for proper technique of therex and is motivated throughout session with no increased pain. PT will continue progression as able.    Personal Factors and Comorbidities Comorbidity 1;Comorbidity 2;Fitness;Past/Current Experience;Profession;Sex    Comorbidities benign brain tumor with remaining deficits, HTN    Examination-Activity Limitations Bed Mobility;Squat;Lift;Stairs;Stand;Transfers    Examination-Participation Restrictions Occupation;Cleaning;Community Activity;Laundry    Stability/Clinical Decision Making Evolving/Moderate complexity    Clinical Decision Making Moderate    Rehab Potential Good    PT Frequency 2x / week    PT Duration 8 weeks    PT Treatment/Interventions Ultrasound;Stair training;Therapeutic exercise;Gait training;Dry needling;Joint Manipulations;Spinal Manipulations;Passive range of motion;Patient/family education;Manual techniques;Orthotic Fit/Training;Neuromuscular re-education;Taping;Therapeutic activities;DME Instruction;Balance training;Functional mobility training;Moist Heat;Electrical Stimulation;Cryotherapy;ADLs/Self Care Home Management;Aquatic Therapy;Traction    PT Next Visit Plan Possible LLE bracing    PT Home Exercise Plan bridge, SLR, hooklying clamshell, seated knee ext    Consulted and Agree with Plan of Care Patient           Patient will benefit from skilled therapeutic intervention in order to improve the following deficits and impairments:  Abnormal gait, Decreased balance, Decreased endurance, Decreased mobility, Difficulty walking,  Increased muscle spasms, Pain, Postural dysfunction, Impaired flexibility, Hypermobility, Decreased coordination, Decreased activity tolerance, Decreased strength, Impaired tone, Improper body mechanics, Decreased range of motion  Visit Diagnosis: Left knee pain, unspecified chronicity  Muscle weakness (generalized)  Stiffness of left knee, not elsewhere classified  Difficulty in walking, not elsewhere classified     Problem List Patient Active Problem List   Diagnosis Date Noted  . Left knee pain 11/18/2019  . Colon cancer screening 01/25/2018  . Hyperparathyroidism, primary ([place]) 03/11/2017  . 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 History of ankle fracture 11/04/2016  . Health care maintenance 08/22/2015  . Hyperglycemia 08/10/2015  . Hot flashes 02/14/2015  . Skin lesion of left arm 02/14/2015  . Gait disturbance 07/11/2014  . Right knee pain 12/29/2013  . Essential hypertension, benign 02/08/2013  . History of seizure disorder 09/28/2012  . History of brain tumor 09/11/2012   Durwin Reges DPT Durwin Reges 02/04/2020, 3:36 PM  [facility] [address] [place], [address] Phone: [phone]   Fax:  [phone]  Name: Rachael Zimmerman MRN: [id number] Date of Birth: 09/29/1960

## 2020-02-08 ENCOUNTER — Ambulatory Visit: Payer: Medicare Other | Admitting: Physical Therapy

## 2020-02-08 ENCOUNTER — Other Ambulatory Visit: Payer: Self-pay

## 2020-02-08 ENCOUNTER — Encounter: Payer: Self-pay | Admitting: Physical Therapy

## 2020-02-08 DIAGNOSIS — M25562 Pain in left knee: Secondary | ICD-10-CM

## 2020-02-08 DIAGNOSIS — R262 Difficulty in walking, not elsewhere classified: Secondary | ICD-10-CM | POA: Diagnosis not present

## 2020-02-08 DIAGNOSIS — M25662 Stiffness of left knee, not elsewhere classified: Secondary | ICD-10-CM | POA: Diagnosis not present

## 2020-02-08 DIAGNOSIS — M6281 Muscle weakness (generalized): Secondary | ICD-10-CM | POA: Diagnosis not present

## 2020-02-08 NOTE — Therapy (Signed)
[facility] [address] [place], [address] Phone: [phone]   Fax:  [phone]  Physical Therapy Treatment  Patient Details  Name: Rachael Zimmerman MRN: [id number] Date of Birth: 03/21/1961 No data recorded  Encounter Date: 59/30/2021   PT End of Session - 02/08/20 1606    Visit Number 6    Number of Visits 16    Date for PT Re-Evaluation 03/08/20    PT Start Time 0400    PT Stop Time 0440    PT Time Calculation (min) 40 min    Activity Tolerance Patient tolerated treatment well    Behavior During Therapy [facility] for tasks assessed/performed           Past Medical History:  Diagnosis Date  . 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 Hypertension   . Pre-diabetes    patient denies ; see lab result for a1c 03-06-17 is 6.0  . Seizures ([place])    AFTER BRAIN SURGERY; none recently   . Weakness of right side of body    d/t brain tumor intervention     Past Surgical History:  Procedure Laterality Date  . ABDOMINAL HYSTERECTOMY  2010   ovaries not removed  . BRAIN SURGERY     tumor excision s/p craniectomy  . BREAST BIOPSY Left 2010   benign  . CATARACT EXTRACTION W/PHACO Left 11/07/2016   Procedure: CATARACT EXTRACTION PHACO AND INTRAOCULAR LENS PLACEMENT (IOC);  Surgeon: Estill Cotta, MD;  Location: [facility] ORS;  Service: Ophthalmology;  Laterality: Left;  [place] 00:53.9 AP% 16.7 CDE 17.02 Fluid Pack Lot # [id number]  . PARATHYROIDECTOMY Right 03/14/2017   Procedure: RIGHT PARATHYROIDECTOMY;  Surgeon: Armandina Gemma, MD;  Location: [facility];  Service: General;  Laterality: Right;    There were no vitals filed for this visit.   Subjective Assessment - 02/08/20 1605    Subjective Patient reports her sister visited this weekend, and she was walking and on the go a lot. Reports no pain with this, which she is happy with. Compliance with HEP.    Pertinent History Pt is a 59 year old female presenting  with L knee pain > 6 months. Patient reports benign brain tumor that she had removed in 2003, but that left no motor control/strength of entire R side of body. She went through PT for this for 41months and that since strength and function has returned, but she aware this side is weaker than the L. She was seeing PT for L knee pain prior to COVID and this was helping, then early 2021 Jan pain came back on insidiously. She reports she has increased pain with STS, prolonged sitting, and is not time of day dependent. Pain is better with motion and walking. Worst pain over the past week 5/10; best: 3/10. Pt works in daycare part time, and enjoys shopping. She reports the knee pain does not bother her much at work but that she does not feel comfortable lifting small children, and has trouble standing from prolonged sitting, like for story time. She feels like she is more tired following a day out d/t general fatigue with decreased activity d/t COVID and knee pain. She would like a regular exercise program. Pt denies N/V, B&B changes, unexplained weight fluctuation, saddle paresthesia, fever, night sweats, or unrelenting night pain at this time.    Limitations House hold activities;Lifting    How long can you sit comfortably? 1 hour  How long can you stand comfortably? unlimited    How long can you walk comfortably? unlimited by knee pain    Diagnostic tests Xray    Patient Stated Goals Increase strength of RLE and decrease L knee pain    Pain Onset More than a month ago           Ther-Ex Nustep L3 32mins min cuing to maintain SPM over 70 R SLR 3x  10 with cuing to prevent lumbar ext through core activation, and RLE adduction and maintain knee ext with decent carry over L SL bridge 3x 10 unable to hold LLE up, PT assisted; cuing for glute contraction without excessive lumbar ext compensation with some carry over L hip abd YTB 3x 10 with min cuing for eccentric control with good carry over Attempted L side  bridge with too much difficulty RLE lateral step up onto 8in step 3x 8 with cuing for technique and to prevent R eversion with decent carry over L hip hike with 2x 12; R hip hike with L toes down 2x 12 with heavy cuing for success, good good carry over        PT Education - 02/08/20 1606    Education Details therex form/technique    Person(s) Educated Patient    Methods Explanation;Demonstration;Verbal cues    Comprehension Verbalized understanding;Returned demonstration;Verbal cues required            PT Short Term Goals - 01/13/20 1655      PT SHORT TERM GOAL #1   Title Be independent with initial home exercise program for self-management of symptoms.    Baseline 01/12/20    Time 4    Period Weeks    Status New             PT Long Term Goals - 01/13/20 1658      PT LONG TERM GOAL #1   Title Pt will decrease worst pain as reported on NPRS by at least 3 points in order to demonstrate clinically significant reduction in ankle/foot pain.    Baseline 01/12/20 5/10 L knee pain    Time 4    Period Weeks    Status New      PT LONG TERM GOAL #2   Title Patient will demonstrate gross 5/5 L hip strength to demonstrate clinically significant increased strength in order to increase ind with transfers    Baseline 01/12/20 see IE    Time 8    Period Weeks    Status New      PT LONG TERM GOAL #3   Title Pt will decrease 5TSTS by at least 3 seconds in order to demonstrate clinically significant improvement in LE strength    Baseline 01/13/20 12sec    Time 8    Period Weeks    Status New      PT LONG TERM GOAL #4   Title Pt will increase 10MWT by at least 0.13 m/s in order to demonstrate clinically significant improvement in community ambulation.    Baseline 01/12/20 0.5m/s    Time 8    Period Weeks    Status New      PT LONG TERM GOAL #5   Title Patient will increase FOTO score to  to demonstrate predicted increase in functional mobility to complete ADLs    Baseline 01/12/20  FOTO to be assessed next session    Time 8    Period Weeks    Status New  Plan - 02/08/20 1639    Clinical Impression Statement PT continued therex progression for increased BLE strenght, motor control, and stability with good success. Patient is able to complete all therex with good carry over of multi-modal cuing, and good motivation throughout session. PT will continue progression as able.    Personal Factors and Comorbidities Comorbidity 1;Comorbidity 2;Fitness;Past/Current Experience;Profession;Sex    Comorbidities benign brain tumor with remaining deficits, HTN    Examination-Activity Limitations Bed Mobility;Squat;Lift;Stairs;Stand;Transfers    Examination-Participation Restrictions Occupation;Cleaning;Community Activity;Laundry    Stability/Clinical Decision Making Evolving/Moderate complexity    Clinical Decision Making Moderate    Rehab Potential Good    PT Frequency 2x / week    PT Duration 8 weeks    PT Treatment/Interventions Ultrasound;Stair training;Therapeutic exercise;Gait training;Dry needling;Joint Manipulations;Spinal Manipulations;Passive range of motion;Patient/family education;Manual techniques;Orthotic Fit/Training;Neuromuscular re-education;Taping;Therapeutic activities;DME Instruction;Balance training;Functional mobility training;Moist Heat;Electrical Stimulation;Cryotherapy;ADLs/Self Care Home Management;Aquatic Therapy;Traction    PT Next Visit Plan Possible LLE bracing    PT Home Exercise Plan bridge, SLR, hooklying clamshell, seated knee ext    Consulted and Agree with Plan of Care Patient           Patient will benefit from skilled therapeutic intervention in order to improve the following deficits and impairments:  Abnormal gait, Decreased balance, Decreased endurance, Decreased mobility, Difficulty walking, Increased muscle spasms, Pain, Postural dysfunction, Impaired flexibility, Hypermobility, Decreased coordination, Decreased  activity tolerance, Decreased strength, Impaired tone, Improper body mechanics, Decreased range of motion  Visit Diagnosis: Left knee pain, unspecified chronicity  Muscle weakness (generalized)  Stiffness of left knee, not elsewhere classified  Difficulty in walking, not elsewhere classified     Problem List Patient Active Problem List   Diagnosis Date Noted  . Left knee pain 11/18/2019  . Colon cancer screening 01/25/2018  . Hyperparathyroidism, primary ([place]) 03/11/2017  . 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 History of ankle fracture 11/04/2016  . Health care maintenance 08/22/2015  . Hyperglycemia 08/10/2015  . Hot flashes 02/14/2015  . Skin lesion of left arm 02/14/2015  . Gait disturbance 07/11/2014  . Right knee pain 12/29/2013  . Essential hypertension, benign 02/08/2013  . History of seizure disorder 09/28/2012  . History of brain tumor 09/11/2012   Durwin Reges DPT Durwin Reges 02/08/2020, 4:42 PM  [facility] [address] [place], [address] Phone: [phone]   Fax:  [phone]  Name: Rachael Zimmerman MRN: [id number] Date of Birth: 09-18-1960

## 2020-02-10 ENCOUNTER — Encounter: Payer: Self-pay | Admitting: Emergency Medicine

## 2020-02-10 ENCOUNTER — Ambulatory Visit: Payer: Medicare Other | Attending: Internal Medicine | Admitting: Physical Therapy

## 2020-02-10 ENCOUNTER — Emergency Department: Payer: Medicare Other

## 2020-02-10 ENCOUNTER — Emergency Department
Admission: EM | Admit: 2020-02-10 | Discharge: 2020-02-10 | Disposition: A | Discharge status: Home / Self Care | Payer: Medicare Other | Attending: Emergency Medicine | Admitting: Emergency Medicine

## 2020-02-10 ENCOUNTER — Other Ambulatory Visit: Payer: Self-pay

## 2020-02-10 DIAGNOSIS — Y999 Unspecified external cause status: Secondary | ICD-10-CM | POA: Diagnosis not present

## 2020-02-10 DIAGNOSIS — Z79899 Other long term (current) drug therapy: Secondary | ICD-10-CM | POA: Insufficient documentation

## 2020-02-10 DIAGNOSIS — Y9289 Other specified places as the place of occurrence of the external cause: Secondary | ICD-10-CM | POA: Insufficient documentation

## 2020-02-10 DIAGNOSIS — W228XXA Striking against or struck by other objects, initial encounter: Secondary | ICD-10-CM | POA: Insufficient documentation

## 2020-02-10 DIAGNOSIS — Y93E5 Activity, floor mopping and cleaning: Secondary | ICD-10-CM | POA: Insufficient documentation

## 2020-02-10 DIAGNOSIS — S60922A Unspecified superficial injury of left hand, initial encounter: Secondary | ICD-10-CM | POA: Diagnosis present

## 2020-02-10 DIAGNOSIS — S6992XA Unspecified injury of left wrist, hand and finger(s), initial encounter: Secondary | ICD-10-CM | POA: Diagnosis not present

## 2020-02-10 DIAGNOSIS — I10 Essential (primary) hypertension: Secondary | ICD-10-CM | POA: Insufficient documentation

## 2020-02-10 DIAGNOSIS — S60222A Contusion of left hand, initial encounter: Secondary | ICD-10-CM

## 2020-02-10 NOTE — Discharge Instructions (Addendum)
Ice and elevate left hand as needed for discomfort and if there is any swelling.  Wear the Ace wrap for support and protection.  Aleve 1 tablet twice a day with food.  May follow-up with your primary care provider if any continued problems or not improving.  Studies today were negative for any acute bony injury.

## 2020-02-10 NOTE — ED Notes (Signed)
See triage note  Presents with injury to left hand  States hit her hand on dresser

## 2020-02-10 NOTE — ED Provider Notes (Signed)
[facility] Emergency Department Provider Note   ____________________________________________   First MD Initiated Contact with Patient 02/10/20 1005     (approximate)  I have reviewed the triage vital signs and the nursing notes.   HISTORY  Chief Complaint Hand Pain    HPI Rachael Zimmerman is a 59 y.o. female presents to the ED with complaint of left hand pain.  Patient states that she was making of the bed and dropped a pillow.  She states her left hand hit the corner of the dresser and she is continued to have pain especially in her left thumb.  She rates her pain as a 9 out of 10.       Past Medical History:  Diagnosis Date  . 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[place])    s/p craniectomy  . Hypertension   . Pre-diabetes    patient denies ; see lab result for a1c 03-06-17 is 6.0  . Seizures ([place])    AFTER BRAIN SURGERY; none recently   . Weakness of right side of body    d/t brain tumor intervention     Patient Active Problem List   Diagnosis Date Noted  . Left knee pain 11/18/2019  . Colon cancer screening 01/25/2018  . Hyperparathyroidism, primary ([place]) 03/11/2017  . 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 History of ankle fracture 11/04/2016  . Health care maintenance 08/22/2015  . Hyperglycemia 08/10/2015  . Hot flashes 02/14/2015  . Skin lesion of left arm 02/14/2015  . Gait disturbance 07/11/2014  . Right knee pain 12/29/2013  . Essential hypertension, benign 02/08/2013  . History of seizure disorder 09/28/2012  . History of brain tumor 09/11/2012    Past Surgical History:  Procedure Laterality Date  . ABDOMINAL HYSTERECTOMY  2010   ovaries not removed  . BRAIN SURGERY     tumor excision s/p craniectomy  . BREAST BIOPSY Left 2010   benign  . CATARACT EXTRACTION W/PHACO Left 11/07/2016   Procedure: CATARACT EXTRACTION PHACO AND INTRAOCULAR LENS PLACEMENT (IOC);  Surgeon: Estill Cotta, MD;  Location: [facility] ORS;  Service: Ophthalmology;   Laterality: Left;  [place] 00:53.9 AP% 16.7 CDE 17.02 Fluid Pack Lot # [id number]  . PARATHYROIDECTOMY Right 03/14/2017   Procedure: RIGHT PARATHYROIDECTOMY;  Surgeon: Armandina Gemma, MD;  Location: [facility] ORS;  Service: General;  Laterality: Right;    Prior to Admission medications   Medication Sig Start Date End Date Taking? Authorizing Provider  Acetaminophen (TYLENOL PO) Take by mouth as needed.    [provider]  hydrochlorothiazide (MICROZIDE) 12.5 MG capsule TAKE 1 CAPSULE BY MOUTH EVERY DAY 09/10/19   Einar Pheasant, MD    Allergies Penicillins  Family History  Problem Relation Age of Onset  . Cancer Mother        ovary  . Breast cancer Neg Hx   . Colon cancer Neg Hx     Social History Social History   Tobacco Use  . Smoking status: Never Smoker  . Smokeless tobacco: Never Used  Substance Use Topics  . Alcohol use: Yes    Alcohol/week: 0.0 standard drinks    Comment: seldom   . Drug use: No    Review of Systems Constitutional: No fever/chills Eyes: No visual changes. Cardiovascular: Denies chest pain. Respiratory: Denies shortness of breath. Gastrointestinal:   No nausea, no vomiting.  Musculoskeletal: Positive left hand pain. Skin: Negative for rash. Neurological: Negative for headaches, focal weakness or numbness.  ____________________________________________   PHYSICAL EXAM:  VITAL SIGNS: ED Triage  Vitals  Enc Vitals Group     BP 02/10/20 0917 135/70     Pulse Rate 02/10/20 0917 67     Resp 02/10/20 0917 17     Temp 02/10/20 0917 97.9 F (36.6 C)     Temp Source 02/10/20 0917 Oral     SpO2 02/10/20 0917 100 %     Weight 02/10/20 0844 174 lb (78.9 kg)     Height 02/10/20 0844 5\' 2"  (1.575 m)     Head Circumference --      Peak Flow --      Pain Score 02/10/20 0844 9     Pain Loc --      Pain Edu? --      Excl. in [place]? --     Constitutional: Alert and oriented. Well appearing and in no acute distress. Eyes: Conjunctivae are normal.  Head:  Atraumatic. Neck: No stridor.   Cardiovascular: Normal rate, regular rhythm. Grossly normal heart sounds.  Good peripheral circulation. Respiratory: Normal respiratory effort.  No retractions. Lungs CTAB. Musculoskeletal: Examination left hand there is no gross deformity and no soft tissue edema or discoloration noted.  Patient is able to flex and extend her left thumb slowly with discomfort.  Skin is intact.  Pulses present.  Patient is able to move remaining digits without any difficulty. Neurologic:  Normal speech and language. No gross focal neurologic deficits are appreciated.  Skin:  Skin is warm, dry and intact. No rash noted. Psychiatric: Mood and affect are normal. Speech and behavior are normal.  ____________________________________________   LABS (all labs ordered are listed, but only abnormal results are displayed)  Labs Reviewed - No data to display ____________________________________________  RADIOLOGY   Official radiology report(s): DG Hand Complete Left  Result Date: 02/10/2020 CLINICAL DATA:  Pain after trauma EXAM: LEFT HAND - COMPLETE 3+ VIEW COMPARISON:  None. FINDINGS: No definite soft tissue swelling.  No acute fracture or dislocation. IMPRESSION: No acute osseous abnormality. Electronically Signed   By: Abigail Miyamoto M.D.   On: 02/10/2020 11:59    ____________________________________________   PROCEDURES  Procedure(s) performed (including Critical Care):  Procedures   ____________________________________________   INITIAL IMPRESSION / ASSESSMENT AND PLAN / ED COURSE  As part of my medical decision making, I reviewed the following data within the electronic MEDICAL RECORD NUMBER Notes from prior ED visits and [place] Controlled Substance Database  Rachael Zimmerman was evaluated in Emergency Department on 02/10/2020 for the symptoms described in the history of present illness. She was evaluated in the context of the global COVID-19 pandemic, which necessitated  consideration that the patient might be at risk for infection with the SARS-CoV-2 virus that causes COVID-19. Institutional protocols and algorithms that pertain to the evaluation of patients at risk for COVID-19 are in a state of rapid change based on information released by regulatory bodies including the CDC and federal and state organizations. These policies and algorithms were followed during the patient's care in the ED.  59-year-old female presents to the ED with complaint of left hand pain after she hit her hand on a dresser while making her bed this morning.  There is some localized tenderness to the left thumb but no gross deformity was noted and x-rays negative for acute bony injury.  Patient was made aware.  An Ace wrap was applied for protection and support.  She is encouraged to take anti-inflammatory for the next 5 days and ice and elevate as needed.  She is to follow-up  with her PCP if any continued problems.   ____________________________________________   FINAL CLINICAL IMPRESSION(S) / ED DIAGNOSES  Final diagnoses:  Contusion of left hand, initial encounter     ED Discharge Orders    None       Note:  This document was prepared using Dragon voice recognition software and may include unintentional dictation errors.    Johnn Hai, PA-C 02/10/20 1429    Delman Kitten, MD 02/10/20 1534

## 2020-02-10 NOTE — ED Triage Notes (Signed)
Pt reports was making up a bed and dropped a pillow and hit her left hand on the corner of a dresser. Pt reports pain mostly to left thumb.

## 2020-02-17 ENCOUNTER — Ambulatory Visit: Payer: Medicare Other | Admitting: Physical Therapy

## 2020-02-19 ENCOUNTER — Ambulatory Visit: Payer: Medicare Other | Admitting: Physical Therapy

## 2020-02-22 ENCOUNTER — Ambulatory Visit: Payer: Medicare Other | Admitting: Physical Therapy

## 2020-02-24 ENCOUNTER — Ambulatory Visit: Payer: Medicare Other | Admitting: Physical Therapy

## 2020-02-29 ENCOUNTER — Ambulatory Visit: Payer: Medicare Other | Admitting: Physical Therapy

## 2020-03-04 ENCOUNTER — Ambulatory Visit: Payer: Medicare Other | Admitting: Physical Therapy

## 2020-03-08 ENCOUNTER — Ambulatory Visit: Payer: Medicare Other | Admitting: Physical Therapy

## 2020-03-11 ENCOUNTER — Ambulatory Visit: Payer: Medicare Other | Admitting: Physical Therapy

## 2020-03-14 DIAGNOSIS — Z20822 Contact with and (suspected) exposure to covid-19: Secondary | ICD-10-CM | POA: Diagnosis not present

## 2020-03-16 ENCOUNTER — Other Ambulatory Visit: Payer: Self-pay

## 2020-03-16 ENCOUNTER — Other Ambulatory Visit: Payer: Medicare Other

## 2020-03-28 ENCOUNTER — Other Ambulatory Visit: Payer: Self-pay

## 2020-03-28 ENCOUNTER — Other Ambulatory Visit (INDEPENDENT_AMBULATORY_CARE_PROVIDER_SITE_OTHER): Payer: Medicare Other

## 2020-03-28 DIAGNOSIS — Z23 Encounter for immunization: Secondary | ICD-10-CM

## 2020-03-28 DIAGNOSIS — R739 Hyperglycemia, unspecified: Secondary | ICD-10-CM | POA: Diagnosis not present

## 2020-03-28 DIAGNOSIS — I10 Essential (primary) hypertension: Secondary | ICD-10-CM | POA: Diagnosis not present

## 2020-03-28 DIAGNOSIS — Z1159 Encounter for screening for other viral diseases: Secondary | ICD-10-CM

## 2020-03-28 LAB — CBC WITH DIFFERENTIAL/PLATELET
Basophils Absolute: 0 10*3/uL (ref 0.0–0.1)
Basophils Relative: 0.6 % (ref 0.0–3.0)
Eosinophils Absolute: 0 10*3/uL (ref 0.0–0.7)
Eosinophils Relative: 0.6 % (ref 0.0–5.0)
HCT: 38.4 % (ref 36.0–46.0)
Hemoglobin: 12.9 g/dL (ref 12.0–15.0)
Lymphocytes Relative: 35.1 % (ref 12.0–46.0)
Lymphs Abs: 2.7 10*3/uL (ref 0.7–4.0)
MCHC: 33.6 g/dL (ref 30.0–36.0)
MCV: 87.6 fl (ref 78.0–100.0)
Monocytes Absolute: 0.4 10*3/uL (ref 0.1–1.0)
Monocytes Relative: 4.9 % (ref 3.0–12.0)
Neutro Abs: 4.6 10*3/uL (ref 1.4–7.7)
Neutrophils Relative %: 58.8 % (ref 43.0–77.0)
Platelets: 155 10*3/uL (ref 150.0–400.0)
RBC: 4.38 Mil/uL (ref 3.87–5.11)
RDW: 13.9 % (ref 11.5–15.5)
WBC: 7.8 10*3/uL (ref 4.0–10.5)

## 2020-03-28 LAB — LIPID PANEL
Cholesterol: 189 mg/dL (ref 0–200)
HDL: 64.1 mg/dL (ref >39.00)
LDL Cholesterol: 112 mg/dL — ABNORMAL HIGH (ref 0–99)
NonHDL: 124.65
Total CHOL/HDL Ratio: 3
Triglycerides: 62 mg/dL (ref 0.0–149.0)
VLDL: 12.4 mg/dL (ref 0.0–40.0)

## 2020-03-28 LAB — TSH: TSH: 1.89 u[IU]/mL (ref 0.35–4.50)

## 2020-03-28 LAB — BASIC METABOLIC PANEL
BUN: 12 mg/dL (ref 6–23)
CO2: 26 mEq/L (ref 19–32)
Calcium: 10 mg/dL (ref 8.4–10.5)
Chloride: 107 mEq/L (ref 96–112)
Creatinine, Ser: 0.83 mg/dL (ref 0.40–1.20)
GFR: 76.96 mL/min (ref >60.00)
Glucose, Bld: 121 mg/dL — ABNORMAL HIGH (ref 70–99)
Potassium: 3.6 mEq/L (ref 3.5–5.1)
Sodium: 139 mEq/L (ref 135–145)

## 2020-03-28 LAB — HEPATIC FUNCTION PANEL
ALT: 16 U/L (ref 0–35)
AST: 14 U/L (ref 0–37)
Albumin: 4.2 g/dL (ref 3.5–5.2)
Alkaline Phosphatase: 75 U/L (ref 39–117)
Bilirubin, Direct: 0.1 mg/dL (ref 0.0–0.3)
Total Bilirubin: 0.5 mg/dL (ref 0.2–1.2)
Total Protein: 7 g/dL (ref 6.0–8.3)

## 2020-03-28 LAB — HEMOGLOBIN A1C: Hgb A1c MFr Bld: 6 % (ref 4.6–6.5)

## 2020-03-29 LAB — HEPATITIS C ANTIBODY
Hepatitis C Ab: NONREACTIVE
SIGNAL TO CUT-OFF: 0.02 (ref <1.00)

## 2020-04-15 ENCOUNTER — Other Ambulatory Visit: Payer: Self-pay | Admitting: Internal Medicine

## 2020-05-19 ENCOUNTER — Other Ambulatory Visit: Payer: Self-pay

## 2020-05-19 ENCOUNTER — Encounter: Payer: Self-pay | Admitting: Internal Medicine

## 2020-05-19 ENCOUNTER — Telehealth (INDEPENDENT_AMBULATORY_CARE_PROVIDER_SITE_OTHER): Payer: Medicare Other | Admitting: Internal Medicine

## 2020-05-19 DIAGNOSIS — R42 Dizziness and giddiness: Secondary | ICD-10-CM

## 2020-05-19 DIAGNOSIS — R739 Hyperglycemia, unspecified: Secondary | ICD-10-CM | POA: Diagnosis not present

## 2020-05-19 DIAGNOSIS — Z8669 Personal history of other diseases of the nervous system and sense organs: Secondary | ICD-10-CM | POA: Diagnosis not present

## 2020-05-19 DIAGNOSIS — Z87898 Personal history of other specified conditions: Secondary | ICD-10-CM | POA: Diagnosis not present

## 2020-05-19 DIAGNOSIS — I10 Essential (primary) hypertension: Secondary | ICD-10-CM

## 2020-05-19 DIAGNOSIS — E210 Primary hyperparathyroidism: Secondary | ICD-10-CM | POA: Diagnosis not present

## 2020-05-19 DIAGNOSIS — R269 Unspecified abnormalities of gait and mobility: Secondary | ICD-10-CM

## 2020-05-19 NOTE — Progress Notes (Signed)
Patient ID: Rachael Zimmerman, female   DOB: 1961/05/19, 59 y.o.   MRN: [id number]   Virtual Visit via video Note  This visit type was conducted due to national recommendations for restrictions regarding the COVID-19 pandemic (e.g. social distancing).  This format is felt to be most appropriate for this patient at this time.  All issues noted in this document were discussed and addressed.  No physical exam was performed (except for noted visual exam findings with Video Visits).   I connected with Rachael Zimmerman by a video enabled telemedicine application and verified that I am speaking with the correct person using two identifiers. Location patient: home Location provider: work Persons participating in the virtual visit: patient, provider  The limitations, risks, security and privacy concerns of performing an evaluation and management service by video and the availability of in person appointments have been discussed.  It has also been discussed with the patient that there may be a patient responsible charge related to this service. The patient expressed understanding and agreed to proceed.   Reason for visit:  Scheduled follow up.   HPI: Follow up regarding her blood pressure and blood sugar.  States she has been doing relatively well.  Tries to stay as active as possible.  Has been going to physical therapy for her left knee and muscle weakness.  Has a history of vertigo.  Last previous episode approximately three years ago.  Woke this am - room spinning.  Spins more when sits up.  No headache.  No fever.  No sinus congestion.  Eating and drinking ok.  No nausea or vomiting.  Bowels moving.  Has taken meclizine previously.  Request refill.      ROS: See pertinent positives and negatives per HPI.  Past Medical History:  Diagnosis Date  . 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[place])    s/p craniectomy  . Hypertension   . Pre-diabetes    patient denies ; see lab result for a1c 03-06-17 is 6.0  . Seizures ([place])     AFTER BRAIN SURGERY; none recently   . Weakness of right side of body    d/t brain tumor intervention     Past Surgical History:  Procedure Laterality Date  . ABDOMINAL HYSTERECTOMY  2010   ovaries not removed  . BRAIN SURGERY     tumor excision s/p craniectomy  . BREAST BIOPSY Left 2010   benign  . CATARACT EXTRACTION W/PHACO Left 11/07/2016   Procedure: CATARACT EXTRACTION PHACO AND INTRAOCULAR LENS PLACEMENT (IOC);  Surgeon: Estill Cotta, MD;  Location: [facility] ORS;  Service: Ophthalmology;  Laterality: Left;  [place] 00:53.9 AP% 16.7 CDE 17.02 Fluid Pack Lot # [id number]  . PARATHYROIDECTOMY Right 03/14/2017   Procedure: RIGHT PARATHYROIDECTOMY;  Surgeon: Armandina Gemma, MD;  Location: [facility]S;  Service: General;  Laterality: Right;    Family History  Problem Relation Age of Onset  . Cancer Mother        ovary  . Breast cancer Neg Hx   . Colon cancer Neg Hx     SOCIAL HX: reviewed.    Current Outpatient Medications:  .  hydrochlorothiazide (MICROZIDE) 12.5 MG capsule, TAKE 1 CAPSULE BY MOUTH EVERY DAY, Disp: 90 capsule, Rfl: 1 .  Acetaminophen (TYLENOL PO), Take by mouth as needed. (Patient not taking: Reported on 05/19/2020), Disp: , Rfl:   EXAM:  VITALS per patient if applicable: 354/65  GENERAL: alert, oriented, appears well and in no acute distress  HEENT: atraumatic, conjunttiva clear, no obvious abnormalities on  inspection of external nose and ears  NECK: normal movements of the head and neck  LUNGS: on inspection no signs of respiratory distress, breathing rate appears normal, no obvious gross SOB, gasping or wheezing  CV: no obvious cyanosis  PSYCH/NEURO: pleasant and cooperative, no obvious depression or anxiety, speech and thought processing grossly intact  ASSESSMENT AND PLAN:  Discussed the following assessment and plan:  Problem List Items Addressed This Visit    History of brain tumor    S/p excision and craniectomy.  Has been previously evaluated  by Dr Manuella Ghazi.  Request referral back jut to f/u to confirm no further testing or evaluation warranted.        Relevant Orders   Ambulatory referral to Neurology   History of seizure disorder    Has done well on keppra.  Follow. Request f/u with neurology.       Relevant Orders   Ambulatory referral to Neurology   Essential hypertension, benign    The 10-year ASCVD risk score Mikey Bussing DC Brooke Bonito., et al., 2013) is: 5.6%   Values used to calculate the score:     Age: 41 years     Sex: Female     Is Non-Hispanic African American: Yes     Diabetic: No     Tobacco smoker: No     Systolic Blood Pressure: 233 mmHg     Is BP treated: Yes     HDL Cholesterol: 64.1 mg/dL     Total Cholesterol: 189 mg/dL  Blood pressure as outlined.  Continue hctz.  Follow pressures.  Follow metabolic panel.       Relevant Orders   Hepatic function panel   Lipid panel   Basic metabolic panel   Gait disturbance    Has been working with PT - strengthening and gait training.        Hyperglycemia    Low carb diet and exercise.  Follow met b and a1c.        Relevant Orders   Hemoglobin A1c   Hyperparathyroidism, primary ([place])    S/p parathyroidectomy.  Has been followed by Dr Harlow Asa.  Follow calcium.  Normal bone density 2019.        Dizziness    Dizziness as outlined.  Has a history of vertigo.  Describes room spinning.  Request refill for meclizine.  Discussed ENT referral for evaluation.  She request neurology referral for f/u given history of brain tumor and seizure disorder.  Has been stable.       Relevant Orders   Ambulatory referral to Neurology       I discussed the assessment and treatment plan with the patient. The patient was provided an opportunity to ask questions and all were answered. The patient agreed with the plan and demonstrated an understanding of the instructions.   The patient was advised to call back or seek an in-person evaluation if the symptoms worsen or if the condition fails  to improve as anticipated.    Einar Pheasant, MD

## 2020-05-19 NOTE — Assessment & Plan Note (Addendum)
The 10-year ASCVD risk score Mikey Bussing DC Brooke Bonito., et al., 2013) is: 5.6%   Values used to calculate the score:     Age: 59 years     Sex: Female     Is Non-Hispanic African American: Yes     Diabetic: No     Tobacco smoker: No     Systolic Blood Pressure: 947 mmHg     Is BP treated: Yes     HDL Cholesterol: 64.1 mg/dL     Total Cholesterol: 189 mg/dL  Blood pressure as outlined.  Continue hctz.  Follow pressures.  Follow metabolic panel.

## 2020-05-29 ENCOUNTER — Encounter: Payer: Self-pay | Admitting: Internal Medicine

## 2020-05-29 ENCOUNTER — Telehealth: Payer: Self-pay | Admitting: Internal Medicine

## 2020-05-29 DIAGNOSIS — R42 Dizziness and giddiness: Secondary | ICD-10-CM | POA: Insufficient documentation

## 2020-05-29 NOTE — Assessment & Plan Note (Signed)
Has done well on keppra.  Follow. Request f/u with neurology.

## 2020-05-29 NOTE — Assessment & Plan Note (Signed)
S/p excision and craniectomy.  Has been previously evaluated by Dr Manuella Ghazi.  Request referral back jut to f/u to confirm no further testing or evaluation warranted.

## 2020-05-29 NOTE — Assessment & Plan Note (Signed)
Low carb diet and exercise.  Follow met b and a1c.   

## 2020-05-29 NOTE — Assessment & Plan Note (Addendum)
Dizziness as outlined.  Has a history of vertigo.  Describes room spinning.  Request refill for meclizine.  Discussed ENT referral for evaluation.  She request neurology referral for f/u given history of brain tumor and seizure disorder.  Has been stable.

## 2020-05-29 NOTE — Assessment & Plan Note (Signed)
S/p parathyroidectomy.  Has been followed by Dr Harlow Asa.  Follow calcium.  Normal bone density 2019.

## 2020-05-29 NOTE — Assessment & Plan Note (Signed)
Has been working with PT - strengthening and gait training.

## 2020-05-29 NOTE — Telephone Encounter (Signed)
Please call and confirm with pt doing ok.  Previously had dizziness - last virtual visit.  Also notify her that I have referred her to neurology.  Someone should be contacting her with appt date and time.  If persistent dizziness, can refer to ENT. Just let me know.

## 2020-05-30 NOTE — Telephone Encounter (Signed)
Called patient. Unable to LM.  

## 2020-05-31 NOTE — Telephone Encounter (Signed)
LMTCB

## 2020-05-31 NOTE — Telephone Encounter (Signed)
Left detailed message for patient.

## 2020-05-31 NOTE — Telephone Encounter (Signed)
Pt called back returning your call °

## 2020-07-28 ENCOUNTER — Telehealth: Payer: Self-pay

## 2020-07-28 NOTE — Telephone Encounter (Signed)
Pt called and states that Dr Nicki Reaper was going to call her in something for vertigo but never did. She asked if you could please send that to [facility] in [facility] and then give her a call

## 2020-07-29 NOTE — Telephone Encounter (Signed)
I do not see anything in her chart about this, LMTCB

## 2020-08-03 NOTE — Telephone Encounter (Signed)
Called patient. Unable leave message

## 2020-08-04 ENCOUNTER — Other Ambulatory Visit: Payer: Self-pay | Admitting: Internal Medicine

## 2020-08-04 DIAGNOSIS — Z1231 Encounter for screening mammogram for malignant neoplasm of breast: Secondary | ICD-10-CM

## 2020-08-05 NOTE — Telephone Encounter (Signed)
LMTCB

## 2020-08-16 ENCOUNTER — Telehealth: Payer: Self-pay | Admitting: Internal Medicine

## 2020-08-16 NOTE — Telephone Encounter (Signed)
Rejection Reason - Patient was No Show" Cindi Carbon said on Aug 15, 2020 2:25 PM  [facility] neurology

## 2020-08-19 ENCOUNTER — Other Ambulatory Visit (INDEPENDENT_AMBULATORY_CARE_PROVIDER_SITE_OTHER): Payer: Medicare Other

## 2020-08-19 ENCOUNTER — Other Ambulatory Visit: Payer: Self-pay

## 2020-08-19 DIAGNOSIS — R739 Hyperglycemia, unspecified: Secondary | ICD-10-CM | POA: Diagnosis not present

## 2020-08-19 DIAGNOSIS — I10 Essential (primary) hypertension: Secondary | ICD-10-CM | POA: Diagnosis not present

## 2020-08-19 LAB — BASIC METABOLIC PANEL
BUN: 13 mg/dL (ref 6–23)
CO2: 29 mEq/L (ref 19–32)
Calcium: 10.6 mg/dL — ABNORMAL HIGH (ref 8.4–10.5)
Chloride: 104 mEq/L (ref 96–112)
Creatinine, Ser: 0.96 mg/dL (ref 0.40–1.20)
GFR: 64.62 mL/min (ref >60.00)
Glucose, Bld: 87 mg/dL (ref 70–99)
Potassium: 4.1 mEq/L (ref 3.5–5.1)
Sodium: 138 mEq/L (ref 135–145)

## 2020-08-19 LAB — HEPATIC FUNCTION PANEL
ALT: 15 U/L (ref 0–35)
AST: 14 U/L (ref 0–37)
Albumin: 4.2 g/dL (ref 3.5–5.2)
Alkaline Phosphatase: 82 U/L (ref 39–117)
Bilirubin, Direct: 0.1 mg/dL (ref 0.0–0.3)
Total Bilirubin: 0.4 mg/dL (ref 0.2–1.2)
Total Protein: 6.9 g/dL (ref 6.0–8.3)

## 2020-08-19 LAB — LIPID PANEL
Cholesterol: 171 mg/dL (ref 0–200)
HDL: 61.7 mg/dL (ref >39.00)
LDL Cholesterol: 94 mg/dL (ref 0–99)
NonHDL: 109.46
Total CHOL/HDL Ratio: 3
Triglycerides: 76 mg/dL (ref 0.0–149.0)
VLDL: 15.2 mg/dL (ref 0.0–40.0)

## 2020-08-19 LAB — HEMOGLOBIN A1C: Hgb A1c MFr Bld: 5.9 % (ref 4.6–6.5)

## 2020-08-20 ENCOUNTER — Other Ambulatory Visit: Payer: Self-pay | Admitting: Internal Medicine

## 2020-08-20 NOTE — Progress Notes (Signed)
Order placed for f/u calcium check.

## 2020-08-22 ENCOUNTER — Other Ambulatory Visit: Payer: Self-pay

## 2020-08-22 ENCOUNTER — Ambulatory Visit
Admission: RE | Admit: 2020-08-22 | Discharge: 2020-08-22 | Disposition: A | Discharge status: Home / Self Care | Payer: Medicare Other | Source: Ambulatory Visit | Attending: Internal Medicine | Admitting: Internal Medicine

## 2020-08-22 DIAGNOSIS — Z1231 Encounter for screening mammogram for malignant neoplasm of breast: Secondary | ICD-10-CM | POA: Insufficient documentation

## 2020-08-23 ENCOUNTER — Encounter: Payer: Medicare Other | Admitting: Internal Medicine

## 2020-08-23 DIAGNOSIS — N3281 Overactive bladder: Secondary | ICD-10-CM | POA: Diagnosis not present

## 2020-08-26 LAB — HM PAP SMEAR: HM Pap smear: NEGATIVE

## 2020-09-19 ENCOUNTER — Other Ambulatory Visit: Payer: Self-pay

## 2020-09-19 ENCOUNTER — Other Ambulatory Visit (INDEPENDENT_AMBULATORY_CARE_PROVIDER_SITE_OTHER): Payer: Medicare Other

## 2020-09-19 LAB — CALCIUM: Calcium: 10.2 mg/dL (ref 8.4–10.5)

## 2020-10-05 ENCOUNTER — Telehealth: Payer: Self-pay

## 2020-10-05 ENCOUNTER — Ambulatory Visit (INDEPENDENT_AMBULATORY_CARE_PROVIDER_SITE_OTHER): Payer: Medicare Other

## 2020-10-05 ENCOUNTER — Ambulatory Visit: Payer: Medicare Other

## 2020-10-05 VITALS — Ht 62.0 in | Wt 171.0 lb

## 2020-10-05 DIAGNOSIS — Z0000 Encounter for general adult medical examination without abnormal findings: Secondary | ICD-10-CM

## 2020-10-05 NOTE — Progress Notes (Addendum)
Subjective:   Rachael Zimmerman is a 60 y.o. female who presents for Medicare Annual (Subsequent) preventive examination.  Review of Systems    No ROS.  Medicare Wellness Virtual Visit.    Cardiac Risk Factors include: advanced age (>35men, >44 women)     Objective:    Today's Vitals   10/05/20 1323  Weight: 171 lb (77.6 kg)  Height: 5\' 2"  (1.575 m)   Body mass index is 31.28 kg/m.  Advanced Directives 10/05/2020 02/10/2020 01/12/2020 10/05/2019 07/16/2018 08/10/2017 03/14/2017  Does Patient Have a Medical Advance Directive? No No No No No No No  Would patient like information on creating a medical advance directive? No - Patient declined No - Patient declined No - Patient declined Yes (MAU/Ambulatory/Procedural Areas - Information given) No - Patient declined Yes (ED - Information included in AVS) No - Patient declined    Current Medications (verified) Outpatient Encounter Medications as of 10/05/2020  Medication Sig  . Acetaminophen (TYLENOL PO) Take by mouth as needed. (Patient not taking: Reported on 05/19/2020)  . hydrochlorothiazide (MICROZIDE) 12.5 MG capsule TAKE 1 CAPSULE BY MOUTH EVERY DAY   No facility-administered encounter medications on file as of 10/05/2020.    Allergies (verified) Penicillins   History: Past Medical History:  Diagnosis Date  . 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HCC)    s/p craniectomy  . Hypertension   . Pre-diabetes    patient denies ; see lab result for a1c 03-06-17 is 6.0  . Seizures (HCC)    AFTER BRAIN SURGERY; none recently   . Weakness of right side of body    d/t brain tumor intervention    Past Surgical History:  Procedure Laterality Date  . ABDOMINAL HYSTERECTOMY  2010   ovaries not removed  . BRAIN SURGERY     tumor excision s/p craniectomy  . BREAST BIOPSY Left 2010   benign  . CATARACT EXTRACTION W/PHACO Left 11/07/2016   Procedure: CATARACT EXTRACTION PHACO AND INTRAOCULAR LENS PLACEMENT (IOC);  Surgeon: 11/09/2016, MD;  Location:  [facility] ORS;  Service: Ophthalmology;  Laterality: Left;  Sallee Lange 00:53.9 AP% 16.7 CDE 17.02 Fluid Pack Lot # [id number]  . PARATHYROIDECTOMY Right 03/14/2017   Procedure: RIGHT PARATHYROIDECTOMY;  Surgeon: 05/14/2017, MD;  Location: [facility];  Service: General;  Laterality: Right;   Family History  Problem Relation Age of Onset  . Cancer Mother        ovary  . Breast cancer Neg Hx   . Colon cancer Neg Hx    Social History   Socioeconomic History  . Marital status: Legally Separated    Spouse name: Not on file  . Number of children: 1  . Years of education: Not on file  . Highest education level: Not on file  Occupational History    Employer: [employer]  Tobacco Use  . Smoking status: Never Smoker  . Smokeless tobacco: Never Used  Substance and Sexual Activity  . Alcohol use: Yes    Alcohol/week: 0.0 standard drinks    Comment: seldom   . Drug use: No  . Sexual activity: Not on file  Other Topics Concern  . Not on file  Social History Narrative  . Not on file   Social Determinants of Health   Financial Resource Strain: Low Risk   . Difficulty of Paying Living Expenses: Not hard at all  Food Insecurity: No Food Insecurity  . Worried About Darnell Level in the Last Year: Never true  .  Ran Out of Food in the Last Year: Never true  Transportation Needs: No Transportation Needs  . Lack of Transportation (Medical): No  . Lack of Transportation (Non-Medical): No  Physical Activity: Not on file  Stress: No Stress Concern Present  . Feeling of Stress : Not at all  Social Connections: Unknown  . Frequency of Communication with Friends and Family: Not on file  . Frequency of Social Gatherings with Friends and Family: More than three times a week  . Attends Religious Services: Not on file  . Active Member of Clubs or Organizations: Not on file  . Attends Archivist Meetings: Not on file  . Marital Status: Not on file    Tobacco  Counseling Counseling given: Not Answered   Clinical Intake:  Pre-visit preparation completed: Yes        Diabetes: No  How often do you need to have someone help you when you read instructions, pamphlets, or other written materials from your doctor or pharmacy?: 1 - Never    Interpreter Needed?: No      Activities of Daily Living In your present state of health, do you have any difficulty performing the following activities: 10/05/2020  Hearing? N  Vision? N  Difficulty concentrating or making decisions? N  Walking or climbing stairs? N  Dressing or bathing? N  Doing errands, shopping? N  Preparing Food and eating ? N  Using the Toilet? N  In the past six months, have you accidently leaked urine? N  Do you have problems with loss of bowel control? N  Managing your Medications? N  Managing your Finances? N  Housekeeping or managing your Housekeeping? N  Some recent data might be hidden    Patient Care Team: Einar Pheasant, MD as PCP - General (Internal Medicine)  Indicate any recent Medical Services you may have received from other than [facility] providers in the past year (date may be approximate).     Assessment:   This is a routine wellness examination for Rachael Zimmerman.  ,I connected with Rachael Zimmerman today by telephone and verified that I am speaking with the correct person using two identifiers. Location patient: home Location provider: work Persons participating in the virtual visit: patient, Marine scientist.    I discussed the limitations, risks, security and privacy concerns of performing an evaluation and management service by telephone and the availability of in person appointments. The patient expressed understanding and verbally consented to this telephonic visit.    Interactive audio and video telecommunications were attempted between this provider and patient, however failed, due to patient having technical difficulties OR patient did not have access to video capability.   We continued and completed visit with audio only.  Some vital signs may be absent or patient reported.   Hearing/Vision screen  Hearing Screening   125Hz  250Hz  500Hz  1000Hz  2000Hz  3000Hz  4000Hz  6000Hz  8000Hz   Right ear:           Left ear:           Comments: Patient is able to hear conversational tones without difficulty.  No issues reported.  Vision Screening Comments: Followed by  Wears corrective lenses Cataract extraction, bilateral Visual acuity not assessed, virtual visit.  They have seen their ophthalmologist in the last 12 months.     Dietary issues and exercise activities discussed:    Regular diet Good water intake  Goals      Patient Stated   .  I would like to resume physical therapy exercises (pt-stated)  Increase physical exercise/balance      Depression Screen PHQ 2/9 Scores 10/05/2020 05/19/2020 10/05/2019 06/13/2017 05/01/2016 07/09/2014  PHQ - 2 Score 0 0 0 0 0 0    Fall Risk Fall Risk  10/05/2020 05/19/2020 10/05/2019 06/13/2017 05/01/2016  Falls in the past year? 0 0 0 No No  Number falls in past yr: 0 0 - - -  Injury with Fall? 0 0 - - -  Follow up Falls evaluation completed Falls evaluation completed Falls evaluation completed - -    FALL RISK PREVENTION PERTAINING TO THE HOME: Handrails in use when climbing stairs? Yes Home free of loose throw rugs in walkways, pet beds, electrical cords, etc? Yes  Adequate lighting in your home to reduce risk of falls? Yes   ASSISTIVE DEVICES UTILIZED TO PREVENT FALLS: Life alert? No  Use of a cane, walker or w/c? No  Grab bars in the bathroom? No  Shower chair or bench in shower? No  Elevated toilet seat or a handicapped toilet? No   TIMED UP AND GO: Was the test performed? No . Virtual visit.   Cognitive Function: Patient is alert and oriented x3.  Denies difficulty focusing, making decisions, memory loss.  Enjoys working with children.  MMSE/6CIT deferred. Normal by direct observation/communication.        6CIT Screen 10/05/2019  What Year? 0 points  What month? 0 points  What time? 0 points  Months in reverse 0 points    Immunizations Immunization History  Administered Date(s) Administered  . Influenza,inj,Quad PF,6+ Mos 03/25/2014, 05/01/2016, 03/06/2017, 05/07/2018, 03/28/2020  . PFIZER(Purple Top)SARS-COV-2 Vaccination 08/20/2019, 09/15/2019     Health Maintenance There are no preventive care reminders to display for this patient. Health Maintenance  Topic Date Due  . COVID-19 Vaccine (3 - Pfizer risk 4-dose series) 01/10/2021 (Originally 10/13/2019)  . Fecal DNA (Cologuard)  01/10/2021 (Originally 10/26/2010)  . HIV Screening  01/10/2021 (Originally 10/26/1975)  . TETANUS/TDAP  10/05/2021 (Originally 10/26/1979)  . INFLUENZA VACCINE  01/09/2021  . PAP SMEAR-Modifier  07/26/2022  . MAMMOGRAM  08/23/2022  . Hepatitis C Screening  Completed  . HPV VACCINES  Aged Out    Colonoscopy- completed 12/2019  Mammogram status: Completed 08/22/20. Repeat every year  Bone Density status: Completed 02/13/18. Results reflect: Bone density results: NORMAL. Repeat every 5 years.   Tdap- deferred per patient. Patient aware this vaccine can be received at local pharmacy or health department.   Shingles vaccine- plans to complete at local pharmacy. Will notify office if Rx required.   HIV screening- deferred for lab follow up with pcp.   Covid booster- Agrees to update immunization record when completed.   Lung Cancer Screening: (Low Dose CT Chest recommended if Age 51-80 years, 30 pack-year currently smoking OR have quit w/in 15years.) does not qualify.   Dental Screening: Recommended annual dental exams for proper oral hygiene.  Community Resource Referral / Chronic Care Management: CRR required this visit?  No   CCM required this visit?  No      Plan:    Keep all routine maintenance appointments.   Follow up 01/10/21  Patient states she and pcp discussed the need for  medication for vertigo last office visit 05/2020. Reports not receiving any medication reagarding the topic and is requesting medication for vertigo be sent to pharmacy on file. States vertigo intermittent, no falls. Next office visit 01/2021. Nurse deferred to pcp for follow up.   I have personally reviewed and noted the following in the  patient's chart:   . Medical and social history . Use of alcohol, tobacco or illicit drugs  . Current medications and supplements- no opioids in use.  . Functional ability and status . Nutritional status . Physical activity . Advanced directives . List of other physicians . Hospitalizations, surgeries, and ER visits in previous 12 months . Vitals . Screenings to include cognitive, depression, and falls . Referrals and appointments  In addition, I have reviewed and discussed with patient certain preventive protocols, quality metrics, and best practice recommendations. A written personalized care plan for preventive services as well as general preventive health recommendations were provided to patient via mychart.     OBrien-Blaney, Tangy Drozdowski L, LPN   7/51/7001     I have reviewed the above information and agree with above.   Deborra Medina, MD

## 2020-10-05 NOTE — Telephone Encounter (Signed)
Called patient. Unable to leave message.

## 2020-10-05 NOTE — Telephone Encounter (Addendum)
Patient states she and pcp discussed the need for medication for vertigo last office visit 05/2020. Reports not receiving any medication reagarding the topic and is requesting medication refill (meclizine) for vertigo be sent to pharmacy on file. States vertigo intermittent, no falls. Next office visit 01/2021. Nurse deferred to pcp for follow up.

## 2020-10-05 NOTE — Patient Instructions (Addendum)
Rachael Zimmerman , Thank you for taking time to come for your Medicare Wellness Visit. I appreciate your ongoing commitment to your health goals. Please review the following plan we discussed and let me know if I can assist you in the future.   These are the goals we discussed: Goals      Patient Stated   .  I would like to resume physical therapy exercises (pt-stated)      Increase physical exercise/balance       This is a list of the screening recommended for you and due dates:  Health Maintenance  Topic Date Due  . COVID-19 Vaccine (3 - Pfizer risk 4-dose series) 01/10/2021*  . Cologuard (Stool DNA test)  01/10/2021*  . HIV Screening  01/10/2021*  . Tetanus Vaccine  10/05/2021*  . Flu Shot  01/09/2021  . Pap Smear  07/26/2022  . Mammogram  08/23/2022  .  Hepatitis C: One time screening is recommended by Center for Disease Control  (CDC) for  adults born from [birth year] through 1965.   Completed  . HPV Vaccine  Aged Out  *Topic was postponed. The date shown is not the original due date.   Keep all routine maintenance appointments.   Follow up 01/10/21  Advanced directives: not yet completed.   Next appointment: Follow up in one year for your annual wellness visit.   Preventive Care 40-64 Years, Female Preventive care refers to lifestyle choices and visits with your health care provider that can promote health and wellness. What does preventive care include?  A yearly physical exam. This is also called an annual well check.  Dental exams once or twice a year.  Routine eye exams. Ask your health care provider how often you should have your eyes checked.  Personal lifestyle choices, including:  Daily care of your teeth and gums.  Regular physical activity.  Eating a healthy diet.  Avoiding tobacco and drug use.  Limiting alcohol use.  Practicing safe sex.  Taking low-dose aspirin daily starting at age 76.  Taking vitamin and mineral supplements as recommended by your  health care provider. What happens during an annual well check? The services and screenings done by your health care provider during your annual well check will depend on your age, overall health, lifestyle risk factors, and family history of disease. Counseling  Your health care provider may ask you questions about your:  Alcohol use.  Tobacco use.  Drug use.  Emotional well-being.  Home and relationship well-being.  Sexual activity.  Eating habits.  Work and work Statistician.  Method of birth control.  Menstrual cycle.  Pregnancy history. Screening  You may have the following tests or measurements:  Height, weight, and BMI.  Blood pressure.  Lipid and cholesterol levels. These may be checked every 5 years, or more frequently if you are over 65 years old.  Skin check.  Lung cancer screening. You may have this screening every year starting at age 27 if you have a 30-pack-year history of smoking and currently smoke or have quit within the past 15 years.  Fecal occult blood test (FOBT) of the stool. You may have this test every year starting at age 81.  Flexible sigmoidoscopy or colonoscopy. You may have a sigmoidoscopy every 5 years or a colonoscopy every 10 years starting at age 3.  Hepatitis C blood test.  Hepatitis B blood test.  Sexually transmitted disease (STD) testing.  Diabetes screening. This is done by checking your blood sugar (glucose) after you  have not eaten for a while (fasting). You may have this done every 1-3 years.  Mammogram. This may be done every 1-2 years. Talk to your health care provider about when you should start having regular mammograms. This may depend on whether you have a family history of breast cancer.  BRCA-related cancer screening. This may be done if you have a family history of breast, ovarian, tubal, or peritoneal cancers.  Pelvic exam and Pap test. This may be done every 3 years starting at age 98. Starting at age 55,  this may be done every 5 years if you have a Pap test in combination with an HPV test.  Bone density scan. This is done to screen for osteoporosis. You may have this scan if you are at high risk for osteoporosis. Discuss your test results, treatment options, and if necessary, the need for more tests with your health care provider. Vaccines  Your health care provider may recommend certain vaccines, such as:  Influenza vaccine. This is recommended every year.  Tetanus, diphtheria, and acellular pertussis (Tdap, Td) vaccine. You may need a Td booster every 10 years.  Zoster vaccine. You may need this after age 59.  Pneumococcal 13-valent conjugate (PCV13) vaccine. You may need this if you have certain conditions and were not previously vaccinated.  Pneumococcal polysaccharide (PPSV23) vaccine. You may need one or two doses if you smoke cigarettes or if you have certain conditions. Talk to your health care provider about which screenings and vaccines you need and how often you need them. This information is not intended to replace advice given to you by your health care provider. Make sure you discuss any questions you have with your health care provider. Document Released: 06/24/2015 Document Revised: 02/15/2016 Document Reviewed: 03/29/2015 Elsevier Interactive Patient Education  2017 [place] Prevention in the Home Falls can cause injuries. They can happen to people of all ages. There are many things you can do to make your home safe and to help prevent falls. What can I do on the outside of my home?  Regularly fix the edges of walkways and driveways and fix any cracks.  Remove anything that might make you trip as you walk through a door, such as a raised step or threshold.  Trim any bushes or trees on the path to your home.  Use bright outdoor lighting.  Clear any walking paths of anything that might make someone trip, such as rocks or tools.  Regularly check to see  if handrails are loose or broken. Make sure that both sides of any steps have handrails.  Any raised decks and porches should have guardrails on the edges.  Have any leaves, snow, or ice cleared regularly.  Use sand or salt on walking paths during winter.  Clean up any spills in your garage right away. This includes oil or grease spills. What can I do in the bathroom?  Use night lights.  Install grab bars by the toilet and in the tub and shower. Do not use towel bars as grab bars.  Use non-skid mats or decals in the tub or shower.  If you need to sit down in the shower, use a plastic, non-slip stool.  Keep the floor dry. Clean up any water that spills on the floor as soon as it happens.  Remove soap buildup in the tub or shower regularly.  Attach bath mats securely with double-sided non-slip rug tape.  Do not have throw rugs and other  things on the floor that can make you trip. What can I do in the bedroom?  Use night lights.  Make sure that you have a light by your bed that is easy to reach.  Do not use any sheets or blankets that are too big for your bed. They should not hang down onto the floor.  Have a firm chair that has side arms. You can use this for support while you get dressed.  Do not have throw rugs and other things on the floor that can make you trip. What can I do in the kitchen?  Clean up any spills right away.  Avoid walking on wet floors.  Keep items that you use a lot in easy-to-reach places.  If you need to reach something above you, use a strong step stool that has a grab bar.  Keep electrical cords out of the way.  Do not use floor polish or wax that makes floors slippery. If you must use wax, use non-skid floor wax.  Do not have throw rugs and other things on the floor that can make you trip. What can I do with my stairs?  Do not leave any items on the stairs.  Make sure that there are handrails on both sides of the stairs and use them.  Fix handrails that are broken or loose. Make sure that handrails are as long as the stairways.  Check any carpeting to make sure that it is firmly attached to the stairs. Fix any carpet that is loose or worn.  Avoid having throw rugs at the top or bottom of the stairs. If you do have throw rugs, attach them to the floor with carpet tape.  Make sure that you have a light switch at the top of the stairs and the bottom of the stairs. If you do not have them, ask someone to add them for you. What else can I do to help prevent falls?  Wear shoes that:  Do not have high heels.  Have rubber bottoms.  Are comfortable and fit you well.  Are closed at the toe. Do not wear sandals.  If you use a stepladder:  Make sure that it is fully opened. Do not climb a closed stepladder.  Make sure that both sides of the stepladder are locked into place.  Ask someone to hold it for you, if possible.  Clearly mark and make sure that you can see:  Any grab bars or handrails.  First and last steps.  Where the edge of each step is.  Use tools that help you move around (mobility aids) if they are needed. These include:  Canes.  Walkers.  Scooters.  Crutches.  Turn on the lights when you go into a dark area. Replace any light bulbs as soon as they burn out.  Set up your furniture so you have a clear path. Avoid moving your furniture around.  If any of your floors are uneven, fix them.  If there are any pets around you, be aware of where they are.  Review your medicines with your doctor. Some medicines can make you feel dizzy. This can increase your chance of falling. Ask your doctor what other things that you can do to help prevent falls. This information is not intended to replace advice given to you by your health care provider. Make sure you discuss any questions you have with your health care provider. Document Released: 03/24/2009 Document Revised: 11/03/2015 Document Reviewed:  07/02/2014 Elsevier Interactive Patient  Education  2017 [employer] Inc[employer]  

## 2020-10-05 NOTE — Telephone Encounter (Signed)
Meclizine is over the counter.  I can send in a prescription if needed.  Confirm no acute symptoms.

## 2020-10-07 NOTE — Telephone Encounter (Signed)
Advised meclizine is OTC. No rx needed. Confirmed pt doing ok.

## 2020-10-13 ENCOUNTER — Ambulatory Visit: Payer: Medicare Other

## 2020-10-19 ENCOUNTER — Ambulatory Visit: Payer: Medicare Other | Admitting: Adult Health

## 2020-10-19 ENCOUNTER — Telehealth: Payer: Self-pay | Admitting: Internal Medicine

## 2020-10-19 DIAGNOSIS — Z0289 Encounter for other administrative examinations: Secondary | ICD-10-CM

## 2020-10-19 NOTE — Telephone Encounter (Signed)
Called patient to get more information. Patient would not disclose any information with me. Specifically asked to have Dr Nicki Reaper call her back. Advised that typically Dr Nicki Reaper does not take phone calls but I could get her set up for a virtual to talk directly with Dr Nicki Reaper. Declined virtual appt and stated that if she can't speak with Dr Nicki Reaper then just forget that she even called. Asked patient if she could give me any information to give to Dr Nicki Reaper since she is seeing patients right now. Patient declined and stated if she calls then I will talk with her but if not then just forget it and hung up the phone.

## 2020-10-19 NOTE — Telephone Encounter (Signed)
Called and spoke to Rachael Zimmerman.  Please schedule her for an appt Friday 10/21/20 at 12:00 - work in for knee problem.

## 2020-10-19 NOTE — Telephone Encounter (Signed)
PT called to ask for a callback from [facility]. PT did not state what it was for.

## 2020-10-21 ENCOUNTER — Ambulatory Visit (INDEPENDENT_AMBULATORY_CARE_PROVIDER_SITE_OTHER): Payer: Medicare Other | Admitting: Internal Medicine

## 2020-10-21 ENCOUNTER — Other Ambulatory Visit: Payer: Self-pay

## 2020-10-21 DIAGNOSIS — I10 Essential (primary) hypertension: Secondary | ICD-10-CM

## 2020-10-21 DIAGNOSIS — Z8669 Personal history of other diseases of the nervous system and sense organs: Secondary | ICD-10-CM

## 2020-10-21 DIAGNOSIS — Z87898 Personal history of other specified conditions: Secondary | ICD-10-CM

## 2020-10-21 DIAGNOSIS — M25561 Pain in right knee: Secondary | ICD-10-CM

## 2020-10-21 DIAGNOSIS — E210 Primary hyperparathyroidism: Secondary | ICD-10-CM | POA: Diagnosis not present

## 2020-10-21 DIAGNOSIS — R739 Hyperglycemia, unspecified: Secondary | ICD-10-CM | POA: Diagnosis not present

## 2020-10-21 NOTE — Progress Notes (Signed)
Patient ID: Rachael Zimmerman, female   DOB: 25-Dec-1960, 60 y.o.   MRN: [id number]   Subjective:    Patient ID: Rachael Zimmerman, female    DOB: July 24, 1960, 60 y.o.   MRN: [id number]  HPI This visit occurred during the SARS-CoV-2 public health emergency.  Safety protocols were in place, including screening questions prior to the visit, additional usage of staff PPE, and extensive cleaning of exam room while observing appropriate contact time as indicated for disinfecting solutions.  Patient here for a work in appt.  Work in for persistent knee pain.  States she was working in her yard - weeds - two weeks ago. Felt - cramp-like pain.  Knee has been hurting after that.  Persistent pain.  Feels she is not able to manipulate her leg as well.  History of right hemiparesis with worsening right knee pain over the last two weeks.  Discussed ortho referral. She is in agreement.  No increased calf redness or swelling.  She has been elevating her leg and taking tylenol.  States otherwise she is doing relatively well.  No chest pain or sob reported.  No abdominal pain or bowel change reported.  Had questions about f/u regarding her left frontoparietal mass.    Past Medical History:  Diagnosis Date  . 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[place])    s/p craniectomy  . Hypertension   . Pre-diabetes    patient denies ; see lab result for a1c 03-06-17 is 6.0  . Seizures ([place])    AFTER BRAIN SURGERY; none recently   . Weakness of right side of body    d/t brain tumor intervention    Past Surgical History:  Procedure Laterality Date  . ABDOMINAL HYSTERECTOMY  2010   ovaries not removed  . BRAIN SURGERY     tumor excision s/p craniectomy  . BREAST BIOPSY Left 2010   benign  . CATARACT EXTRACTION W/PHACO Left 11/07/2016   Procedure: CATARACT EXTRACTION PHACO AND INTRAOCULAR LENS PLACEMENT (IOC);  Surgeon: Estill Cotta, MD;  Location: [facility] ORS;  Service: Ophthalmology;  Laterality: Left;  [place] 00:53.9 AP% 16.7 CDE 17.02 Fluid  Pack Lot # [id number]  . PARATHYROIDECTOMY Right 03/14/2017   Procedure: RIGHT PARATHYROIDECTOMY;  Surgeon: Armandina Gemma, MD;  Location: [facility];  Service: General;  Laterality: Right;   Family History  Problem Relation Age of Onset  . Cancer Mother        ovary  . Breast cancer Neg Hx   . Colon cancer Neg Hx    Social History   Socioeconomic History  . Marital status: Legally Separated    Spouse name: Not on file  . Number of children: 1  . Years of education: Not on file  . Highest education level: Not on file  Occupational History    Employer: [employer]  Tobacco Use  . Smoking status: Never Smoker  . Smokeless tobacco: Never Used  Substance and Sexual Activity  . Alcohol use: Yes    Alcohol/week: 0.0 standard drinks    Comment: seldom   . Drug use: No  . Sexual activity: Not on file  Other Topics Concern  . Not on file  Social History Narrative  . Not on file   Social Determinants of Health   Financial Resource Strain: Low Risk   . Difficulty of Paying Living Expenses: Not hard at all  Food Insecurity: No Food Insecurity  . Worried About Charity fundraiser in the Last Year: Never true  .  Ran Out of Food in the Last Year: Never true  Transportation Needs: No Transportation Needs  . Lack of Transportation (Medical): No  . Lack of Transportation (Non-Medical): No  Physical Activity: Not on file  Stress: No Stress Concern Present  . Feeling of Stress : Not at all  Social Connections: Unknown  . Frequency of Communication with Friends and Family: Not on file  . Frequency of Social Gatherings with Friends and Family: More than three times a week  . Attends Religious Services: Not on file  . Active Member of Clubs or Organizations: Not on file  . Attends Archivist Meetings: Not on file  . Marital Status: Not on file    Outpatient Encounter Medications as of 10/21/2020  Medication Sig  . Acetaminophen (TYLENOL PO) Take by mouth as  needed.  . [DISCONTINUED] hydrochlorothiazide (MICROZIDE) 12.5 MG capsule TAKE 1 CAPSULE BY MOUTH EVERY DAY   No facility-administered encounter medications on file as of 10/21/2020.    Review of Systems  Constitutional: Negative for appetite change and unexpected weight change.  HENT: Negative for congestion and sinus pressure.   Respiratory: Negative for cough, chest tightness and shortness of breath.   Cardiovascular: Negative for chest pain, palpitations and leg swelling.  Gastrointestinal: Negative for abdominal pain, diarrhea, nausea and vomiting.  Genitourinary: Negative for difficulty urinating and dysuria.  Musculoskeletal: Negative for myalgias.       Right knee pain as outlined.    Skin: Negative for color change and rash.  Neurological: Negative for dizziness, light-headedness and headaches.  Psychiatric/Behavioral: Negative for agitation and dysphoric mood.       Objective:    Physical Exam Vitals reviewed.  Constitutional:      General: She is not in acute distress.    Appearance: Normal appearance.  HENT:     Head: Normocephalic and atraumatic.     Right Ear: External ear normal.     Left Ear: External ear normal.  Eyes:     General: No scleral icterus.       Right eye: No discharge.        Left eye: No discharge.     Conjunctiva/sclera: Conjunctivae normal.  Neck:     Thyroid: No thyromegaly.  Cardiovascular:     Rate and Rhythm: Normal rate and regular rhythm.  Pulmonary:     Effort: No respiratory distress.     Breath sounds: Normal breath sounds. No wheezing.  Abdominal:     General: Bowel sounds are normal.     Palpations: Abdomen is soft.     Tenderness: There is no abdominal tenderness.  Musculoskeletal:        General: No tenderness.     Cervical back: Neck supple. No tenderness.     Comments: Increased right knee pain - palpation, movement.  No increased erythema.    Lymphadenopathy:     Cervical: No cervical adenopathy.  Skin:     Findings: No erythema or rash.  Neurological:     Mental Status: She is alert.  Psychiatric:        Mood and Affect: Mood normal.        Behavior: Behavior normal.     BP 138/72   Pulse 90   Temp 98 F (36.7 C)   Resp 16   Ht _0  (1.575 m)   Wt 171 lb 9.6 oz (77.8 kg)   SpO2 99%   BMI 31.39 kg/m  Wt Readings from Last 3 Encounters:  10/21/20 171  lb 9.6 oz (77.8 kg)  10/05/20 171 lb (77.6 kg)  05/19/20 171 lb (77.6 kg)     Lab Results  Component Value Date   WBC 7.8 03/28/2020   HGB 12.9 03/28/2020   HCT 38.4 03/28/2020   PLT 155.0 03/28/2020   GLUCOSE 87 08/19/2020   CHOL 171 08/19/2020   TRIG 76.0 08/19/2020   HDL 61.70 08/19/2020   LDLCALC 94 08/19/2020   ALT 15 08/19/2020   AST 14 08/19/2020   NA 138 08/19/2020   K 4.1 08/19/2020   CL 104 08/19/2020   CREATININE 0.96 08/19/2020   BUN 13 08/19/2020   CO2 29 08/19/2020   TSH 1.89 03/28/2020   HGBA1C 5.9 08/19/2020    MM 3D SCREEN BREAST BILATERAL  Result Date: 08/23/2020 CLINICAL DATA:  Screening. EXAM: DIGITAL SCREENING BILATERAL MAMMOGRAM WITH TOMOSYNTHESIS AND CAD TECHNIQUE: Bilateral screening digital craniocaudal and mediolateral oblique mammograms were obtained. Bilateral screening digital breast tomosynthesis was performed. The images were evaluated with computer-aided detection. COMPARISON:  Previous exam(s). ACR Breast Density Category b: There are scattered areas of fibroglandular density. FINDINGS: There are no findings suspicious for malignancy. The images were evaluated with computer-aided detection. IMPRESSION: No mammographic evidence of malignancy. A result letter of this screening mammogram will be mailed directly to the patient. RECOMMENDATION: Screening mammogram in one year. (Code:SM-B-01Y) BI-RADS CATEGORY  1: Negative. Electronically Signed   By: Claudie Revering M.D.   On: 08/23/2020 13:33       Assessment & Plan:   Problem List Items Addressed This Visit    Essential hypertension,  benign    Blood pressure as outlined.  Continue hctz.  Follow pressures.  Follow metabolic panel.       History of brain tumor    S/p excision and craniectomy.  F/u with neurology to confirm if f/u surveillance testing is warranted.       History of seizure disorder    Has done well off keppra.  Follow.       Hyperglycemia    Low carb diet and exercise.  Follow met b and a1c.       Hyperparathyroidism, primary ([place])    S/p parathyroidectomy.  Has been followed by followed by Dr Harlow Asa.  Follow calcium.  Normal bone density 2019.  Will plan once above issues sorted through - f/u bone density.        Right knee pain    Persistent knee pain as outlined.  Discussed further evaluation.  Hold on xray.  Refer to ortho.        Relevant Orders   Ambulatory referral to Orthopedic Surgery       Einar Pheasant, MD

## 2020-10-22 ENCOUNTER — Other Ambulatory Visit: Payer: Self-pay | Admitting: Internal Medicine

## 2020-10-28 DIAGNOSIS — Z79899 Other long term (current) drug therapy: Secondary | ICD-10-CM | POA: Diagnosis not present

## 2020-10-28 DIAGNOSIS — E559 Vitamin D deficiency, unspecified: Secondary | ICD-10-CM | POA: Diagnosis not present

## 2020-10-28 DIAGNOSIS — G8191 Hemiplegia, unspecified affecting right dominant side: Secondary | ICD-10-CM | POA: Diagnosis not present

## 2020-10-28 DIAGNOSIS — H269 Unspecified cataract: Secondary | ICD-10-CM | POA: Diagnosis not present

## 2020-10-28 DIAGNOSIS — G9389 Other specified disorders of brain: Secondary | ICD-10-CM | POA: Diagnosis not present

## 2020-10-28 DIAGNOSIS — E038 Other specified hypothyroidism: Secondary | ICD-10-CM | POA: Diagnosis not present

## 2020-10-28 DIAGNOSIS — E538 Deficiency of other specified B group vitamins: Secondary | ICD-10-CM | POA: Diagnosis not present

## 2020-10-30 ENCOUNTER — Encounter: Payer: Self-pay | Admitting: Internal Medicine

## 2020-10-30 NOTE — Assessment & Plan Note (Signed)
Low carb diet and exercise.  Follow met b and a1c.  

## 2020-10-30 NOTE — Assessment & Plan Note (Signed)
S/p parathyroidectomy.  Has been followed by followed by Dr Harlow Asa.  Follow calcium.  Normal bone density 2019.  Will plan once above issues sorted through - f/u bone density.

## 2020-10-30 NOTE — Assessment & Plan Note (Signed)
S/p excision and craniectomy.  F/u with neurology to confirm if f/u surveillance testing is warranted.

## 2020-10-30 NOTE — Assessment & Plan Note (Signed)
Blood pressure as outlined.  Continue hctz.  Follow pressures.  Follow metabolic panel.  

## 2020-10-30 NOTE — Assessment & Plan Note (Signed)
Has done well off keppra.  Follow.

## 2020-10-30 NOTE — Assessment & Plan Note (Signed)
Persistent knee pain as outlined.  Discussed further evaluation.  Hold on xray.  Refer to ortho.

## 2020-11-01 ENCOUNTER — Other Ambulatory Visit: Payer: Self-pay | Admitting: Neurology

## 2020-11-01 DIAGNOSIS — G8191 Hemiplegia, unspecified affecting right dominant side: Secondary | ICD-10-CM

## 2020-11-09 ENCOUNTER — Other Ambulatory Visit: Payer: Self-pay

## 2020-11-09 ENCOUNTER — Ambulatory Visit
Admission: RE | Admit: 2020-11-09 | Discharge: 2020-11-09 | Disposition: A | Discharge status: Home / Self Care | Payer: Medicare Other | Source: Ambulatory Visit | Attending: Neurology | Admitting: Neurology

## 2020-11-09 DIAGNOSIS — G8191 Hemiplegia, unspecified affecting right dominant side: Secondary | ICD-10-CM | POA: Diagnosis not present

## 2020-11-09 DIAGNOSIS — I639 Cerebral infarction, unspecified: Secondary | ICD-10-CM | POA: Diagnosis not present

## 2020-11-09 DIAGNOSIS — G9389 Other specified disorders of brain: Secondary | ICD-10-CM | POA: Diagnosis not present

## 2020-11-09 MED ORDER — GADOBUTROL 1 MMOL/ML IV SOLN
7.5000 mL | Freq: Once | INTRAVENOUS | Status: AC | PRN
  Administered 2020-11-09: 7.5 mL via INTRAVENOUS

## 2020-11-28 DIAGNOSIS — Z87898 Personal history of other specified conditions: Secondary | ICD-10-CM | POA: Diagnosis not present

## 2020-11-28 DIAGNOSIS — M25561 Pain in right knee: Secondary | ICD-10-CM | POA: Diagnosis not present

## 2020-11-28 DIAGNOSIS — G8191 Hemiplegia, unspecified affecting right dominant side: Secondary | ICD-10-CM | POA: Diagnosis not present

## 2020-11-28 DIAGNOSIS — M21371 Foot drop, right foot: Secondary | ICD-10-CM | POA: Diagnosis not present

## 2021-01-10 ENCOUNTER — Encounter: Payer: Self-pay | Admitting: Internal Medicine

## 2021-01-10 ENCOUNTER — Other Ambulatory Visit: Payer: Self-pay

## 2021-01-10 ENCOUNTER — Ambulatory Visit (INDEPENDENT_AMBULATORY_CARE_PROVIDER_SITE_OTHER): Payer: Medicare Other | Admitting: Internal Medicine

## 2021-01-10 DIAGNOSIS — Z87898 Personal history of other specified conditions: Secondary | ICD-10-CM | POA: Diagnosis not present

## 2021-01-10 DIAGNOSIS — I10 Essential (primary) hypertension: Secondary | ICD-10-CM

## 2021-01-10 DIAGNOSIS — Z8669 Personal history of other diseases of the nervous system and sense organs: Secondary | ICD-10-CM

## 2021-01-10 DIAGNOSIS — M25561 Pain in right knee: Secondary | ICD-10-CM | POA: Diagnosis not present

## 2021-01-10 DIAGNOSIS — Z1211 Encounter for screening for malignant neoplasm of colon: Secondary | ICD-10-CM

## 2021-01-10 DIAGNOSIS — R739 Hyperglycemia, unspecified: Secondary | ICD-10-CM

## 2021-01-10 DIAGNOSIS — E210 Primary hyperparathyroidism: Secondary | ICD-10-CM | POA: Diagnosis not present

## 2021-01-10 NOTE — Progress Notes (Signed)
Patient ID: Rachael Zimmerman, female   DOB: May 18, 1961, 60 y.o.   MRN: [id number]   Subjective:    Patient ID: Rachael Zimmerman, female    DOB: 1960-06-26, 60 y.o.   MRN: [id number]  HPI This visit occurred during the SARS-CoV-2 public health emergency.  Safety protocols were in place, including screening questions prior to the visit, additional usage of staff PPE, and extensive cleaning of exam room while observing appropriate contact time as indicated for disinfecting solutions.   Patient here for a scheduled follow up.   Here to follow up regarding her blood pressure.  Also has a history of seizures and meningioma.  Seeing neurology.  Had recent MRI - stable.  Plans to f/u with neurology.  No headache.  No dizziness.  No chest pain reported.  Breathing stable.  No increased cough or congestion.  No increased abdominal pain reported.  No bowel issues reported.  No sick contacts.  No fever.  No nausea or vomiting.  She saw ortho regarding her knee.  Planning to f/u for AFO brace.  No further falls.    Past Medical History:  Diagnosis Date   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[facility])    s/p craniectomy   Hypertension    Pre-diabetes    patient denies ; see lab result for a1c 03-06-17 is 6.0   Seizures ([facility])    AFTER BRAIN SURGERY; none recently    Weakness of right side of body    d/t brain tumor intervention    Past Surgical History:  Procedure Laterality Date   ABDOMINAL HYSTERECTOMY  2010   ovaries not removed   BRAIN SURGERY     tumor excision s/p craniectomy   BREAST BIOPSY Left 2010   benign   CATARACT EXTRACTION W/PHACO Left 11/07/2016   Procedure: CATARACT EXTRACTION PHACO AND INTRAOCULAR LENS PLACEMENT (IOC);  Surgeon: Sallee Lange, MD;  Location: [facility] ORS;  Service: Ophthalmology;  Laterality: Left;  [place] 00:53.9 AP% 16.7 CDE 17.02 Fluid Pack Lot # [id number]   PARATHYROIDECTOMY Right 03/14/2017   Procedure: RIGHT PARATHYROIDECTOMY;  Surgeon: Darnell Level, MD;  Location: [facility];  Service:  General;  Laterality: Right;   Family History  Problem Relation Age of Onset   Cancer Mother        ovary   Breast cancer Neg Hx    Colon cancer Neg Hx    Social History   Socioeconomic History   Marital status: Legally Separated    Spouse name: Not on file   Number of children: 1   Years of education: Not on file   Highest education level: Not on file  Occupational History    Employer: [employer]  Tobacco Use   Smoking status: Never   Smokeless tobacco: Never  Substance and Sexual Activity   Alcohol use: Yes    Alcohol/week: 0.0 standard drinks    Comment: seldom    Drug use: No   Sexual activity: Not on file  Other Topics Concern   Not on file  Social History Narrative   Not on file   Social Determinants of Health   Financial Resource Strain: Low Risk    Difficulty of Paying Living Expenses: Not hard at all  Food Insecurity: No Food Insecurity   Worried About Programme researcher, broadcasting/film/video in the Last Year: Never true   Ran Out of Food in the Last Year: Never true  Transportation Needs: No Transportation Needs   Lack of Transportation (Medical): No  Lack of Transportation (Non-Medical): No  Physical Activity: Not on file  Stress: No Stress Concern Present   Feeling of Stress : Not at all  Social Connections: Unknown   Frequency of Communication with Friends and Family: Not on file   Frequency of Social Gatherings with Friends and Family: More than three times a week   Attends Religious Services: Not on file   Active Member of Clubs or Organizations: Not on file   Attends Archivist Meetings: Not on file   Marital Status: Not on file    Review of Systems  Constitutional:  Negative for appetite change and unexpected weight change.  HENT:  Negative for congestion and sinus pressure.   Respiratory:  Negative for cough, chest tightness and shortness of breath.   Cardiovascular:  Negative for chest pain, palpitations and leg swelling.   Gastrointestinal:  Negative for abdominal pain, diarrhea, nausea and vomiting.  Genitourinary:  Negative for difficulty urinating and dysuria.  Musculoskeletal:  Negative for joint swelling and myalgias.  Skin:  Negative for color change and rash.  Neurological:  Negative for dizziness, light-headedness and headaches.  Psychiatric/Behavioral:  Negative for agitation and dysphoric mood.       Objective:    Physical Exam Vitals reviewed.  Constitutional:      General: She is not in acute distress.    Appearance: Normal appearance.  HENT:     Head: Normocephalic and atraumatic.     Right Ear: External ear normal.     Left Ear: External ear normal.  Eyes:     General: No scleral icterus.       Right eye: No discharge.        Left eye: No discharge.     Conjunctiva/sclera: Conjunctivae normal.  Neck:     Thyroid: No thyromegaly.  Cardiovascular:     Rate and Rhythm: Normal rate and regular rhythm.  Pulmonary:     Effort: No respiratory distress.     Breath sounds: Normal breath sounds. No wheezing.  Abdominal:     General: Bowel sounds are normal.     Palpations: Abdomen is soft.     Tenderness: There is no abdominal tenderness.  Musculoskeletal:        General: No swelling or tenderness.     Cervical back: Neck supple. No tenderness.  Lymphadenopathy:     Cervical: No cervical adenopathy.  Skin:    Findings: No erythema or rash.  Neurological:     Mental Status: She is alert.  Psychiatric:        Mood and Affect: Mood normal.        Behavior: Behavior normal.    BP 128/80   Pulse 79   Temp 97.9 F (36.6 C)   Ht 5' 2.01" (1.575 m)   Wt 172 lb 9.6 oz (78.3 kg)   SpO2 98%   BMI 31.56 kg/m  Wt Readings from Last 3 Encounters:  01/10/21 172 lb 9.6 oz (78.3 kg)  10/21/20 171 lb 9.6 oz (77.8 kg)  10/05/20 171 lb (77.6 kg)    Outpatient Encounter Medications as of 01/10/2021  Medication Sig   Acetaminophen (TYLENOL PO) Take by mouth as needed.   Cholecalciferol  25 MCG (1000 UT) capsule Take 1,000 Units by mouth daily.   hydrochlorothiazide (MICROZIDE) 12.5 MG capsule TAKE 1 CAPSULE BY MOUTH EVERY DAY   vitamin B-12 (CYANOCOBALAMIN) 1000 MCG tablet Take 1,000 mcg by mouth daily.   No facility-administered encounter medications on file as of 01/10/2021.  Lab Results  Component Value Date   WBC 7.8 03/28/2020   HGB 12.9 03/28/2020   HCT 38.4 03/28/2020   PLT 155.0 03/28/2020   GLUCOSE 87 08/19/2020   CHOL 171 08/19/2020   TRIG 76.0 08/19/2020   HDL 61.70 08/19/2020   LDLCALC 94 08/19/2020   ALT 15 08/19/2020   AST 14 08/19/2020   NA 138 08/19/2020   K 4.1 08/19/2020   CL 104 08/19/2020   CREATININE 0.96 08/19/2020   BUN 13 08/19/2020   CO2 29 08/19/2020   TSH 1.89 03/28/2020   HGBA1C 5.9 08/19/2020    MR BRAIN W WO CONTRAST  Result Date: 11/09/2020 CLINICAL DATA:  Right hemiparesis. EXAM: MRI HEAD WITHOUT AND WITH CONTRAST TECHNIQUE: Multiplanar, multiecho pulse sequences of the brain and surrounding structures were obtained without and with intravenous contrast. CONTRAST:  7.45mL GADAVIST GADOBUTROL 1 MMOL/ML IV SOLN COMPARISON:  MRI of the brain August 10, 2017. FINDINGS: Brain: Large area of encephalomalacia and gliosis involving the left posterior frontal and parietal lobes with retraction of the posterior aspect of the left lateral ventricle with decreased volume the left thalamus. No focus of abnormal contrast enhancement in the surgical cavity to suggest residual/recurrent tumor remainder of the brain parenchyma is maintained. Acute infarct hemorrhage, hydrocephalus or extra-axial collection. A small dural-based extra-axial lesion in the anterior aspect of the right middle cranial fossa, the right sphenoid wing, measuring approximately 10 x 8 mm, is stable since prior MRI (series 12, image 56). Vascular: Normal flow voids. Skull and upper cervical spine: Postsurgical changes from left frontoparietal craniotomy. Otherwise, normal marrow  signal. Sinuses/Orbits: Left lens surgery.  Paranasal sinuses are clear. Other: None. IMPRESSION: 1. Stable appearance of area of encephalomalacia and gliosis in the left frontoparietal region without evidence of abnormal contrast enhancement to suggest residual/recurrent tumor. 2. Stable appearance of small dural-based extra axial lesion in the right sphenoid wing, most consistent with a small meningioma. Electronically Signed   By: Pedro Earls M.D.   On: 11/09/2020 19:30       Assessment & Plan:   Problem List Items Addressed This Visit     Colon cancer screening    Colonoscopy July 2021 revealed small lipoma in the sigmoid colon and internal hemorrhoids.  Otherwise normal.       Essential hypertension, benign    Blood pressure as outlined.  Continue hctz.  Follow pressures.  Follow metabolic panel.        Relevant Orders   Hepatic function panel   Lipid panel   Basic metabolic panel   History of brain tumor    Status post excision and craniectomy.  Just had follow-up with neurology.  Had follow-up MRI 11/09/2020.  Appeared to be stable.       History of seizure disorder    Has done well off Keppra.  Follow.        Relevant Orders   Hepatic function panel   Hyperglycemia    Low carb diet and exercise.  Follow met b and a1c.        Relevant Orders   Hemoglobin A1c   Hyperparathyroidism, primary ([place])    S/p parathyroidectomy.  Has been followed by followed by Dr Harlow Asa.  Follow calcium.  Normal bone density 2019.  Plan f/u bone density.         Right knee pain    Saw Dr. Daphene Calamity T.  Planning for AFO knee brace.         Einar Pheasant,  MD

## 2021-01-15 ENCOUNTER — Encounter: Payer: Self-pay | Admitting: Internal Medicine

## 2021-01-15 NOTE — Assessment & Plan Note (Signed)
Colonoscopy July 2021 revealed small lipoma in the sigmoid colon and internal hemorrhoids.  Otherwise normal.

## 2021-01-15 NOTE — Assessment & Plan Note (Addendum)
Has done well off Keppra.  Follow.

## 2021-01-15 NOTE — Assessment & Plan Note (Signed)
Low carb diet and exercise.  Follow met b and a1c.

## 2021-01-15 NOTE — Assessment & Plan Note (Signed)
Blood pressure as outlined.  Continue hctz.  Follow pressures.  Follow metabolic panel.  

## 2021-01-15 NOTE — Assessment & Plan Note (Signed)
S/p parathyroidectomy.  Has been followed by followed by Dr Harlow Asa.  Follow calcium.  Normal bone density 2019.  Plan f/u bone density.

## 2021-01-15 NOTE — Assessment & Plan Note (Signed)
Saw Dr. Daphene Calamity T.  Planning for AFO knee brace.

## 2021-01-15 NOTE — Assessment & Plan Note (Signed)
Status post excision and craniectomy.  Just had follow-up with neurology.  Had follow-up MRI 11/09/2020.  Appeared to be stable.

## 2021-02-02 ENCOUNTER — Other Ambulatory Visit: Payer: Medicare Other

## 2021-02-15 DIAGNOSIS — M21371 Foot drop, right foot: Secondary | ICD-10-CM | POA: Diagnosis not present

## 2021-03-16 DIAGNOSIS — G9389 Other specified disorders of brain: Secondary | ICD-10-CM | POA: Diagnosis not present

## 2021-03-16 DIAGNOSIS — E538 Deficiency of other specified B group vitamins: Secondary | ICD-10-CM | POA: Diagnosis not present

## 2021-03-16 DIAGNOSIS — E559 Vitamin D deficiency, unspecified: Secondary | ICD-10-CM | POA: Diagnosis not present

## 2021-04-27 ENCOUNTER — Encounter: Payer: Self-pay | Admitting: Internal Medicine

## 2021-04-27 ENCOUNTER — Ambulatory Visit (INDEPENDENT_AMBULATORY_CARE_PROVIDER_SITE_OTHER): Payer: Medicare Other | Admitting: Internal Medicine

## 2021-04-27 ENCOUNTER — Other Ambulatory Visit: Payer: Self-pay

## 2021-04-27 VITALS — BP 122/70 | HR 91 | Temp 97.1°F | Resp 16 | Ht 62.0 in | Wt 168.8 lb

## 2021-04-27 DIAGNOSIS — Z23 Encounter for immunization: Secondary | ICD-10-CM

## 2021-04-27 DIAGNOSIS — Z8669 Personal history of other diseases of the nervous system and sense organs: Secondary | ICD-10-CM

## 2021-04-27 DIAGNOSIS — R739 Hyperglycemia, unspecified: Secondary | ICD-10-CM

## 2021-04-27 DIAGNOSIS — Z1211 Encounter for screening for malignant neoplasm of colon: Secondary | ICD-10-CM

## 2021-04-27 DIAGNOSIS — Z111 Encounter for screening for respiratory tuberculosis: Secondary | ICD-10-CM

## 2021-04-27 DIAGNOSIS — I10 Essential (primary) hypertension: Secondary | ICD-10-CM | POA: Diagnosis not present

## 2021-04-27 DIAGNOSIS — Z87898 Personal history of other specified conditions: Secondary | ICD-10-CM

## 2021-04-27 NOTE — Assessment & Plan Note (Signed)
S/p excision and craniectomy.  Saw neurology. MRI stable.  Recommended f/u 5-10 years.

## 2021-04-27 NOTE — Assessment & Plan Note (Signed)
Colonoscopy July 2021 revealed small lipoma in the sigmoid colon and internal hemorrhoids.  Otherwise normal. Need copy of colonoscopy.

## 2021-04-27 NOTE — Assessment & Plan Note (Signed)
Request TB skin test for work.  Will return next week.

## 2021-04-27 NOTE — Assessment & Plan Note (Signed)
Blood pressure as outlined.  Continue hctz.  Follow pressures.  Follow metabolic panel.  

## 2021-04-27 NOTE — Progress Notes (Signed)
Patient ID: Rachael Zimmerman, female   DOB: [birth year], 60 y.o.   MRN: [id number]   Subjective:    Patient ID: Rachael Zimmerman, female    DOB: 12-Sep-1960, 60 y.o.   MRN: [id number]  This visit occurred during the SARS-CoV-2 public health emergency.  Safety protocols were in place, including screening questions prior to the visit, additional usage of staff PPE, and extensive cleaning of exam room while observing appropriate contact time as indicated for disinfecting solutions.   Patient here for a scheduled follow up.   Chief Complaint  Patient presents with   Hypertension   .   Hypertension Pertinent negatives include no chest pain, headaches, palpitations or shortness of breath.  Doing well.  Saw neurology 03/16/21 - MRI - stable.  Recommended f/u imaging in 5-10 years.  Working long hours.  Overall appears to be handling this stress ok.  No chest pain or sob reported.  No abdominal pain.  Bowels moving.  Received a brace for her foot.  Irritated her ankle.  Plans to f/u for new fitting.  Knees doing ok.     Past Medical History:  Diagnosis Date   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[place])    s/p craniectomy   Hypertension    Pre-diabetes    patient denies ; see lab result for a1c 03-06-17 is 6.0   Seizures ([place])    AFTER BRAIN SURGERY; none recently    Weakness of right side of body    d/t brain tumor intervention    Past Surgical History:  Procedure Laterality Date   ABDOMINAL HYSTERECTOMY  2010   ovaries not removed   BRAIN SURGERY     tumor excision s/p craniectomy   BREAST BIOPSY Left 2010   benign   CATARACT EXTRACTION W/PHACO Left 11/07/2016   Procedure: CATARACT EXTRACTION PHACO AND INTRAOCULAR LENS PLACEMENT ([place]);  Surgeon: Estill Cotta, MD;  Location: [facility] ORS;  Service: Ophthalmology;  Laterality: Left;  [place] 00:53.9 AP% 16.7 CDE 17.02 Fluid Pack Lot # [id number]   PARATHYROIDECTOMY Right 03/14/2017   Procedure: RIGHT PARATHYROIDECTOMY;  Surgeon: Armandina Gemma, MD;  Location: [facility]  ORS;  Service: General;  Laterality: Right;   Family History  Problem Relation Age of Onset   Cancer Mother        ovary   Breast cancer Neg Hx    Colon cancer Neg Hx    Social History   Socioeconomic History   Marital status: Legally Separated    Spouse name: Not on file   Number of children: 1   Years of education: Not on file   Highest education level: Not on file  Occupational History    Employer: [employer]  Tobacco Use   Smoking status: Never   Smokeless tobacco: Never  Substance and Sexual Activity   Alcohol use: Yes    Alcohol/week: 0.0 standard drinks    Comment: seldom    Drug use: No   Sexual activity: Not on file  Other Topics Concern   Not on file  Social History Narrative   Not on file   Social Determinants of Health   Financial Resource Strain: Low Risk    Difficulty of Paying Living Expenses: Not hard at all  Food Insecurity: No Food Insecurity   Worried About Charity fundraiser in the Last Year: Never true   [place] in the Last Year: Never true  Transportation Needs: No Transportation Needs   Lack of Transportation (Medical): No  Lack of Transportation (Non-Medical): No  Physical Activity: Not on file  Stress: No Stress Concern Present   Feeling of Stress : Not at all  Social Connections: Unknown   Frequency of Communication with Friends and Family: Not on file   Frequency of Social Gatherings with Friends and Family: More than three times a week   Attends Religious Services: Not on file   Active Member of Clubs or Organizations: Not on file   Attends Archivist Meetings: Not on file   Marital Status: Not on file     Review of Systems  Constitutional:  Negative for appetite change and unexpected weight change.  HENT:  Negative for congestion and sinus pressure.   Respiratory:  Negative for cough, chest tightness and shortness of breath.   Cardiovascular:  Negative for chest pain, palpitations and leg  swelling.  Gastrointestinal:  Negative for abdominal pain, diarrhea, nausea and vomiting.  Genitourinary:  Negative for difficulty urinating and dysuria.  Musculoskeletal:  Negative for joint swelling and myalgias.  Skin:  Negative for color change and rash.  Neurological:  Negative for dizziness, light-headedness and headaches.  Psychiatric/Behavioral:  Negative for agitation and dysphoric mood.       Objective:     BP 122/70   Pulse 91   Temp (!) 97.1 F (36.2 C)   Resp 16   Ht 5' 2"  (1.575 m)   Wt 168 lb 12.8 oz (76.6 kg)   SpO2 98%   BMI 30.87 kg/m  Wt Readings from Last 3 Encounters:  04/27/21 168 lb 12.8 oz (76.6 kg)  01/10/21 172 lb 9.6 oz (78.3 kg)  10/21/20 171 lb 9.6 oz (77.8 kg)    Physical Exam Vitals reviewed.  Constitutional:      General: She is not in acute distress.    Appearance: Normal appearance.  HENT:     Head: Normocephalic and atraumatic.     Right Ear: External ear normal.     Left Ear: External ear normal.  Eyes:     General: No scleral icterus.       Right eye: No discharge.        Left eye: No discharge.     Conjunctiva/sclera: Conjunctivae normal.  Neck:     Thyroid: No thyromegaly.  Cardiovascular:     Rate and Rhythm: Normal rate and regular rhythm.  Pulmonary:     Effort: No respiratory distress.     Breath sounds: Normal breath sounds. No wheezing.  Abdominal:     General: Bowel sounds are normal.     Palpations: Abdomen is soft.     Tenderness: There is no abdominal tenderness.  Musculoskeletal:        General: No swelling or tenderness.     Cervical back: Neck supple. No tenderness.  Lymphadenopathy:     Cervical: No cervical adenopathy.  Skin:    Findings: No erythema or rash.  Neurological:     Mental Status: She is alert.  Psychiatric:        Mood and Affect: Mood normal.        Behavior: Behavior normal.     Outpatient Encounter Medications as of 04/27/2021  Medication Sig   Acetaminophen (TYLENOL PO) Take by  mouth as needed.   Cholecalciferol 25 MCG (1000 UT) capsule Take 1,000 Units by mouth daily.   hydrochlorothiazide (MICROZIDE) 12.5 MG capsule TAKE 1 CAPSULE BY MOUTH EVERY DAY   vitamin B-12 (CYANOCOBALAMIN) 1000 MCG tablet Take 1,000 mcg by mouth daily.  No facility-administered encounter medications on file as of 04/27/2021.     Lab Results  Component Value Date   WBC 7.8 03/28/2020   HGB 12.9 03/28/2020   HCT 38.4 03/28/2020   PLT 155.0 03/28/2020   GLUCOSE 87 08/19/2020   CHOL 171 08/19/2020   TRIG 76.0 08/19/2020   HDL 61.70 08/19/2020   LDLCALC 94 08/19/2020   ALT 15 08/19/2020   AST 14 08/19/2020   NA 138 08/19/2020   K 4.1 08/19/2020   CL 104 08/19/2020   CREATININE 0.96 08/19/2020   BUN 13 08/19/2020   CO2 29 08/19/2020   TSH 1.89 03/28/2020   HGBA1C 5.9 08/19/2020    MR BRAIN W WO CONTRAST  Result Date: 11/09/2020 CLINICAL DATA:  Right hemiparesis. EXAM: MRI HEAD WITHOUT AND WITH CONTRAST TECHNIQUE: Multiplanar, multiecho pulse sequences of the brain and surrounding structures were obtained without and with intravenous contrast. CONTRAST:  7.42m GADAVIST GADOBUTROL 1 MMOL/ML IV SOLN COMPARISON:  MRI of the brain August 10, 2017. FINDINGS: Brain: Large area of encephalomalacia and gliosis involving the left posterior frontal and parietal lobes with retraction of the posterior aspect of the left lateral ventricle with decreased volume the left thalamus. No focus of abnormal contrast enhancement in the surgical cavity to suggest residual/recurrent tumor remainder of the brain parenchyma is maintained. Acute infarct hemorrhage, hydrocephalus or extra-axial collection. A small dural-based extra-axial lesion in the anterior aspect of the right middle cranial fossa, the right sphenoid wing, measuring approximately 10 x 8 mm, is stable since prior MRI (series 12, image 56). Vascular: Normal flow voids. Skull and upper cervical spine: Postsurgical changes from left frontoparietal  craniotomy. Otherwise, normal marrow signal. Sinuses/Orbits: Left lens surgery.  Paranasal sinuses are clear. Other: None. IMPRESSION: 1. Stable appearance of area of encephalomalacia and gliosis in the left frontoparietal region without evidence of abnormal contrast enhancement to suggest residual/recurrent tumor. 2. Stable appearance of small dural-based extra axial lesion in the right sphenoid wing, most consistent with a small meningioma. Electronically Signed   By: KPedro EarlsM.D.   On: 11/09/2020 19:30       Assessment & Plan:   Problem List Items Addressed This Visit     Colon cancer screening    Colonoscopy July 2021 revealed small lipoma in the sigmoid colon and internal hemorrhoids.  Otherwise normal. Need copy of colonoscopy.       Essential hypertension, benign    Blood pressure as outlined.  Continue hctz.  Follow pressures.  Follow metabolic panel.       History of brain tumor    S/p excision and craniectomy.  Saw neurology. MRI stable.  Recommended f/u 5-10 years.        History of seizure disorder    Has done well off Keppra.  Follow.       Hyperglycemia    Low carb diet and exercise.  Follow met b and a1c.       Screening-pulmonary TB    Request TB skin test for work.  Will return next week.         CEinar Pheasant MD

## 2021-04-27 NOTE — Assessment & Plan Note (Signed)
Low carb diet and exercise.  Follow met b and a1c.  

## 2021-04-27 NOTE — Assessment & Plan Note (Signed)
Has done well off Keppra.  Follow.

## 2021-05-01 ENCOUNTER — Other Ambulatory Visit: Payer: Self-pay

## 2021-05-01 ENCOUNTER — Ambulatory Visit (INDEPENDENT_AMBULATORY_CARE_PROVIDER_SITE_OTHER): Payer: Medicare Other

## 2021-05-01 DIAGNOSIS — Z111 Encounter for screening for respiratory tuberculosis: Secondary | ICD-10-CM

## 2021-05-01 NOTE — Progress Notes (Signed)
Patient presented for PPD placement to left forearm, patient voiced no concerns nor showed any signs of distress during placement.

## 2021-05-03 ENCOUNTER — Ambulatory Visit (INDEPENDENT_AMBULATORY_CARE_PROVIDER_SITE_OTHER): Payer: Medicare Other | Admitting: *Deleted

## 2021-05-03 ENCOUNTER — Other Ambulatory Visit: Payer: Self-pay

## 2021-05-03 ENCOUNTER — Ambulatory Visit (INDEPENDENT_AMBULATORY_CARE_PROVIDER_SITE_OTHER): Payer: Medicare Other

## 2021-05-03 DIAGNOSIS — R7611 Nonspecific reaction to tuberculin skin test without active tuberculosis: Secondary | ICD-10-CM

## 2021-05-03 DIAGNOSIS — Z111 Encounter for screening for respiratory tuberculosis: Secondary | ICD-10-CM

## 2021-05-03 DIAGNOSIS — M419 Scoliosis, unspecified: Secondary | ICD-10-CM | POA: Diagnosis not present

## 2021-05-03 LAB — TB SKIN TEST
Induration: 10 mm
TB Skin Test: POSITIVE

## 2021-05-03 NOTE — Progress Notes (Addendum)
Patient came in for PPD read Placed 48 hours ago on left arm the induration measured 10 MM X 6 MM with erythema extending 6 CM X 4 CM . Spoke with PCP quantiferon gold ordered with chest X-ray to Rule out active TB. Patient has never traveled outside the [place] nor lived in a congregated group or college dorm, patient has had no previous exposure to high risk population. Patient aware low risk and testing is required to rule out.     I have reviewed the above information and agree with above.   Deborra Medina, MD

## 2021-05-05 LAB — QUANTIFERON-TB GOLD PLUS
Mitogen-NIL: >10 IU/mL
NIL: 1.08 IU/mL
QuantiFERON-TB Gold Plus: POSITIVE — AB
TB1-NIL: 2.99 IU/mL
TB2-NIL: 2.13 IU/mL

## 2021-05-08 ENCOUNTER — Other Ambulatory Visit: Payer: Self-pay | Admitting: Internal Medicine

## 2021-05-08 ENCOUNTER — Telehealth: Payer: Self-pay | Admitting: *Deleted

## 2021-05-08 ENCOUNTER — Telehealth: Payer: Self-pay

## 2021-05-08 DIAGNOSIS — R7612 Nonspecific reaction to cell mediated immunity measurement of gamma interferon antigen response without active tuberculosis: Secondary | ICD-10-CM

## 2021-05-08 NOTE — Telephone Encounter (Signed)
Please transfer t RN when patient calls.

## 2021-05-08 NOTE — Progress Notes (Signed)
Confirmed these are the correct induration measurements. Measured BY Nelida Gores.

## 2021-05-08 NOTE — Telephone Encounter (Signed)
-----   Message from Einar Pheasant, MD sent at 05/08/2021  5:21 AM EST ----- This is the patient we were discussing.  Had a positive TB skin test.  Quantiferon Gold positive.  CXR - no active cardiopulmonary disease.  Please notify pt and notify health department.  Also, would like to refer to infectious disease (if pt agreeable) - to monitor treatment.

## 2021-05-08 NOTE — Progress Notes (Signed)
Order placed for referral to ID

## 2021-05-10 ENCOUNTER — Ambulatory Visit (LOCAL_COMMUNITY_HEALTH_CENTER): Payer: Self-pay

## 2021-05-10 ENCOUNTER — Other Ambulatory Visit: Payer: Self-pay

## 2021-05-10 VITALS — Wt 165.0 lb

## 2021-05-10 DIAGNOSIS — R7612 Nonspecific reaction to cell mediated immunity measurement of gamma interferon antigen response without active tuberculosis: Secondary | ICD-10-CM

## 2021-05-10 NOTE — Progress Notes (Signed)
EPI completed via phone.  Patient did not have any concerning symptoms and reports no known exposures to TB in her childhood or in her adult life. 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 She elected to be tested for TB for her work physical for her own interest, not because of symptoms.   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 She was counseled as to the signs of active disease. She does not have any conditions that would increase her risk of converting to active disease, no DM, no immunodeficiency, no HIV, no ETOH, drug use, she is not underweight.   The patient would like to think about treatment options (INH for 6 months starting immediately versus waitlist for Rifampin 4 month course) and will call back by this Friday with her decision.  Sherrilee Gilles MD

## 2021-05-12 ENCOUNTER — Other Ambulatory Visit (INDEPENDENT_AMBULATORY_CARE_PROVIDER_SITE_OTHER): Payer: Medicare Other

## 2021-05-12 ENCOUNTER — Other Ambulatory Visit: Payer: Self-pay

## 2021-05-12 DIAGNOSIS — I10 Essential (primary) hypertension: Secondary | ICD-10-CM

## 2021-05-12 DIAGNOSIS — R739 Hyperglycemia, unspecified: Secondary | ICD-10-CM

## 2021-05-12 DIAGNOSIS — Z8669 Personal history of other diseases of the nervous system and sense organs: Secondary | ICD-10-CM | POA: Diagnosis not present

## 2021-05-12 LAB — LIPID PANEL
Cholesterol: 185 mg/dL (ref 0–200)
HDL: 59 mg/dL (ref >39.00)
LDL Cholesterol: 117 mg/dL — ABNORMAL HIGH (ref 0–99)
NonHDL: 125.97
Total CHOL/HDL Ratio: 3
Triglycerides: 46 mg/dL (ref 0.0–149.0)
VLDL: 9.2 mg/dL (ref 0.0–40.0)

## 2021-05-12 LAB — BASIC METABOLIC PANEL
BUN: 16 mg/dL (ref 6–23)
CO2: 25 mEq/L (ref 19–32)
Calcium: 10.4 mg/dL (ref 8.4–10.5)
Chloride: 105 mEq/L (ref 96–112)
Creatinine, Ser: 0.96 mg/dL (ref 0.40–1.20)
GFR: 64.29 mL/min (ref >60.00)
Glucose, Bld: 87 mg/dL (ref 70–99)
Potassium: 3.8 mEq/L (ref 3.5–5.1)
Sodium: 138 mEq/L (ref 135–145)

## 2021-05-12 LAB — HEPATIC FUNCTION PANEL
ALT: 17 U/L (ref 0–35)
AST: 15 U/L (ref 0–37)
Albumin: 4.1 g/dL (ref 3.5–5.2)
Alkaline Phosphatase: 67 U/L (ref 39–117)
Bilirubin, Direct: 0 mg/dL (ref 0.0–0.3)
Total Bilirubin: 0.4 mg/dL (ref 0.2–1.2)
Total Protein: 6.7 g/dL (ref 6.0–8.3)

## 2021-05-12 LAB — HEMOGLOBIN A1C: Hgb A1c MFr Bld: 6 % (ref 4.6–6.5)

## 2021-05-15 ENCOUNTER — Telehealth: Payer: Self-pay

## 2021-05-15 NOTE — Telephone Encounter (Signed)
Discussed possible treatment for LTBI with patient last week, 6 mo of INH versus being put on waiting list for Rifampin or no treatment at all.   Patient called in and would like to proceed w tx for LTBI, INH x 6 months. She will be seen in clinic in 2 days for medicine dispensing and to establish care.   Sherrilee Gilles, MD

## 2021-05-15 NOTE — Telephone Encounter (Signed)
TC with patient.  Patient to come into clinic today for PPD placement and EPI. Aileen Fass, RN

## 2021-05-16 ENCOUNTER — Other Ambulatory Visit: Payer: Self-pay | Admitting: Family Medicine

## 2021-05-16 DIAGNOSIS — R7612 Nonspecific reaction to cell mediated immunity measurement of gamma interferon antigen response without active tuberculosis: Secondary | ICD-10-CM | POA: Insufficient documentation

## 2021-05-16 NOTE — Progress Notes (Signed)
Tuberculosis treatment orders  All patients are to be monitored per [place] and county TB policies.   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 Treat for latent TB per the following:  Isoniazid 300mg  PO daily x 6 months. Offer HIV testing at TBM start appt.  Baseline labs normal on 05/12/2021 No monthly LFTs or B6 necessary at this time.  +PPD 05/03/2021 +QFT 05/03/2021 CXR without Active TB 05/03/2021 EPI 05/10/2021

## 2021-05-18 ENCOUNTER — Ambulatory Visit (LOCAL_COMMUNITY_HEALTH_CENTER): Payer: Medicare Other

## 2021-05-18 ENCOUNTER — Other Ambulatory Visit: Payer: Self-pay

## 2021-05-18 VITALS — Wt 169.0 lb

## 2021-05-18 DIAGNOSIS — R7612 Nonspecific reaction to cell mediated immunity measurement of gamma interferon antigen response without active tuberculosis: Secondary | ICD-10-CM

## 2021-05-18 MED ORDER — ISONIAZID 300 MG PO TABS: 300.0000 mg | ORAL_TABLET | Freq: Every day | ORAL | 0 refills | Status: AC

## 2021-05-18 NOTE — Progress Notes (Signed)
INH 300mg  #30 dispensed per [facility] order.  Declined HIV; declination signed. Unable to make next appt d/t Jan. schedule not out.Aileen Fass, RN

## 2021-05-23 ENCOUNTER — Telehealth: Payer: Self-pay | Admitting: Surgery

## 2021-05-23 NOTE — Telephone Encounter (Signed)
Patient was concerned due to mild stomach upset and a rash that has popped up on different parts of her body. First patch appeared Friday after starting meds (Thurs). Small red, itchy patch on arm near elbow crease, later another appeared around her neck and another below her knee.   All areas appear to be resolving slowly, the one that appeared first (elbow) is better but she can see two "bumps" and wonders if it was a bug bite.   Counseled patient that could be insect bites versus mild reaction to new meds and that if she can tolerate these side effects to keep taking the medication while her body adjusts, and will check back in on her Thursday 12/15.  Leigh Aurora, MD

## 2021-05-25 ENCOUNTER — Telehealth: Payer: Self-pay | Admitting: Surgery

## 2021-05-25 NOTE — Telephone Encounter (Signed)
Patient continues to have small, localized breakouts of rash while taking INH for LTBI tx.   First patch started day after 1st dose of INH, just below crease of inner elbow on right arm. No longer red and itchy but dark patch left behind.  New red and itchy patch started on her neck a couple days later but is now completely resolved.   Current rash is also on right arm, near elbow but on outer arm and is still red and itchy.   She has been using topical cortisone cream. No other concerning side effects.   Patient will continue cortisone cream and start Benadryl OTC for itching and rash symptoms. Will follow up sx on Monday 12/19.  Leigh Aurora, MD

## 2021-06-15 ENCOUNTER — Ambulatory Visit (LOCAL_COMMUNITY_HEALTH_CENTER): Payer: Self-pay | Admitting: Surgery

## 2021-06-15 ENCOUNTER — Other Ambulatory Visit: Payer: Self-pay

## 2021-06-15 VITALS — Wt 171.5 lb

## 2021-06-15 DIAGNOSIS — R7612 Nonspecific reaction to cell mediated immunity measurement of gamma interferon antigen response without active tuberculosis: Secondary | ICD-10-CM

## 2021-06-15 MED ORDER — ISONIAZID 300 MG PO TABS: 300.0000 mg | ORAL_TABLET | Freq: Every day | ORAL | 0 refills | Status: AC

## 2021-06-15 NOTE — Progress Notes (Signed)
Patient has been taking INH 300 mg daily for 1 month for LTBI.   Patient is doing well on current medication regimen. No n/v/f/c, eating normally, no concerning weight loss. Patient reports taking medications daily as prescribed.   She reports taking her medications at night before going to bed.  Patient did contact me early in treatment about a migrating rash on her neck, arm and elbow areas. Today patient reports it was some sort of insect bites, her wounds are resolving and it was not related to her INH intake.    Patient given month #2 of INH at visit, dispensed INH 300 mg #30. Patient to follow up in approximately 30 days for dispensing of next month's meds.     Leigh Aurora, MD

## 2021-06-20 ENCOUNTER — Other Ambulatory Visit: Payer: Self-pay

## 2021-06-20 ENCOUNTER — Telehealth: Payer: Self-pay | Admitting: Internal Medicine

## 2021-06-20 MED ORDER — HYDROCHLOROTHIAZIDE 12.5 MG PO CAPS: ORAL_CAPSULE | ORAL | 1 refills | Status: DC

## 2021-06-20 NOTE — Telephone Encounter (Signed)
Refill on hydrochlorothiazide (MICROZIDE) 12.5 MG capsule Send to [facility] [address].

## 2021-06-20 NOTE — Telephone Encounter (Signed)
Meds refilled. Patient aware

## 2021-06-21 DIAGNOSIS — H2511 Age-related nuclear cataract, right eye: Secondary | ICD-10-CM | POA: Diagnosis not present

## 2021-07-03 DIAGNOSIS — R21 Rash and other nonspecific skin eruption: Secondary | ICD-10-CM | POA: Diagnosis not present

## 2021-07-04 ENCOUNTER — Telehealth: Payer: Self-pay | Admitting: Surgery

## 2021-07-04 NOTE — Telephone Encounter (Signed)
Patient continues to have breakouts of rash and itching on upper body/torso. Saw MD at [facility], starting Claritin and prednisone. Will continue INH and if symptoms do not resolve w allergy meds will stop INH for trial period. Patient to be seen in clinic on 1/5, will evaluate symptoms and treatment plan at that visit, she intends to keep taking [place] for now.  Leigh Aurora, MD

## 2021-07-14 ENCOUNTER — Ambulatory Visit (LOCAL_COMMUNITY_HEALTH_CENTER): Payer: Self-pay | Admitting: Surgery

## 2021-07-14 ENCOUNTER — Other Ambulatory Visit: Payer: Self-pay

## 2021-07-14 VITALS — Wt 173.5 lb

## 2021-07-14 DIAGNOSIS — R7612 Nonspecific reaction to cell mediated immunity measurement of gamma interferon antigen response without active tuberculosis: Secondary | ICD-10-CM

## 2021-07-14 MED ORDER — ISONIAZID 300 MG PO TABS: 300.0000 mg | ORAL_TABLET | Freq: Every day | ORAL | 0 refills | Status: AC

## 2021-07-14 NOTE — Progress Notes (Signed)
Patient completed 2nd month of INH for treatment of LTBI, save 2-3 pills in last bottle. Patient had episodes of rash with itching, unclear if related to [place] and was seen by MD for treatment. Took a prednisone taper (7 day) and claritin for 1 week and sx have since resolved. Patient continued taking INH before during and after aforementioned treatment, and rash and itching sx have not returned.   Will continue INH treatment but if symptoms of rash and itching return will stop INH treatment and discuss whether or not to proceed with tx for LTBI.  Dispensed INH 300mg  tablets, #30 at today's appointment.   Leigh Aurora, MD

## 2021-08-10 ENCOUNTER — Ambulatory Visit (LOCAL_COMMUNITY_HEALTH_CENTER): Payer: Medicare Other

## 2021-08-10 ENCOUNTER — Other Ambulatory Visit: Payer: Self-pay

## 2021-08-10 VITALS — Wt 173.0 lb

## 2021-08-10 DIAGNOSIS — R7612 Nonspecific reaction to cell mediated immunity measurement of gamma interferon antigen response without active tuberculosis: Secondary | ICD-10-CM

## 2021-08-10 NOTE — Progress Notes (Signed)
Patient in clinic today for #4 INH/LTBI tx.  States has not had any problems with itching recently and is not currently taking any medications.  Verbalized understanding to call [facility] if any problems or concerns arise. ?INH 300mg  #30 dispensed per Dr. Ernestina Patches 05/16/21 order. Aileen Fass, RN  ? ?Next appt 09/07/21 Aileen Fass, RN  ?

## 2021-08-14 MED ORDER — ISONIAZID 300 MG PO TABS: 300.0000 mg | ORAL_TABLET | Freq: Every day | ORAL | 0 refills | Status: DC

## 2021-08-15 ENCOUNTER — Other Ambulatory Visit: Payer: Self-pay | Admitting: Internal Medicine

## 2021-08-15 DIAGNOSIS — Z1231 Encounter for screening mammogram for malignant neoplasm of breast: Secondary | ICD-10-CM

## 2021-08-25 DIAGNOSIS — U071 COVID-19: Secondary | ICD-10-CM | POA: Diagnosis not present

## 2021-08-25 DIAGNOSIS — Z03818 Encounter for observation for suspected exposure to other biological agents ruled out: Secondary | ICD-10-CM | POA: Diagnosis not present

## 2021-08-29 DIAGNOSIS — Z1211 Encounter for screening for malignant neoplasm of colon: Secondary | ICD-10-CM | POA: Diagnosis not present

## 2021-09-07 ENCOUNTER — Ambulatory Visit (LOCAL_COMMUNITY_HEALTH_CENTER): Payer: Medicare Other

## 2021-09-07 VITALS — Wt 177.0 lb

## 2021-09-07 DIAGNOSIS — R7612 Nonspecific reaction to cell mediated immunity measurement of gamma interferon antigen response without active tuberculosis: Secondary | ICD-10-CM

## 2021-09-07 MED ORDER — ISONIAZID 300 MG PO TABS: 300.0000 mg | ORAL_TABLET | Freq: Every day | ORAL | 0 refills | Status: DC

## 2021-09-07 NOTE — Progress Notes (Signed)
In Nurse Clinic for LTBI / TB Med Management / Due for INH #5 today. ? ?Denies missing any INH this month. Reports 4 pills remaining in current bottle.  ? ?Denies itching, rashes and does not take claritin any longer.  ?Voices concern about  soreness and swelling of Left index finger that she has had for past 2  months. Has appt with PCP 09/20/2021 and RN advised her to have PCP evaluate.  ? ?INH 300 mg #30 (Bottle # 5) dispensed per Dr Lubertha Sayres order on 05/16/2022.  Instructions to take one tablet daily. No LFT's per Dr Ernestina Patches order. ? ?Advised to contact [facility] if questions, concerns, side effects. TB contact card given along with INH info sheet. ? ?Dr Westley Foots informed of pt status by RN.  ? ?Next TB med appt for INH #6/TB med completion scheduled 10/05/2021 at 830 am. Has reminder. Josie Saunders, RN ? ? ? ?

## 2021-09-25 ENCOUNTER — Ambulatory Visit
Admission: RE | Admit: 2021-09-25 | Discharge: 2021-09-25 | Disposition: A | Discharge status: Home / Self Care | Payer: Medicare Other | Source: Ambulatory Visit | Attending: Internal Medicine | Admitting: Internal Medicine

## 2021-09-25 DIAGNOSIS — Z1231 Encounter for screening mammogram for malignant neoplasm of breast: Secondary | ICD-10-CM | POA: Diagnosis not present

## 2021-10-06 ENCOUNTER — Ambulatory Visit: Payer: Medicare Other

## 2021-10-12 ENCOUNTER — Ambulatory Visit (INDEPENDENT_AMBULATORY_CARE_PROVIDER_SITE_OTHER): Payer: Medicare Other

## 2021-10-12 VITALS — Ht 62.0 in | Wt 169.0 lb

## 2021-10-12 DIAGNOSIS — Z0000 Encounter for general adult medical examination without abnormal findings: Secondary | ICD-10-CM

## 2021-10-12 NOTE — Progress Notes (Signed)
Subjective:   Rachael Zimmerman is a 61 y.o. female who presents for Medicare Annual (Subsequent) preventive examination.  Review of Systems    No ROS.  Medicare Wellness Virtual Visit.  Visual/audio telehealth visit, UTA vital signs.   See social history for additional risk factors.   Cardiac Risk Factors include: advanced age (>38men, >95 women)     Objective:    Today's Vitals   10/12/21 1403  Weight: 169 lb (76.7 kg)  Height: 5\' 2"  (1.575 m)   Body mass index is 30.91 kg/m.     10/12/2021    2:14 PM 10/05/2020    1:22 PM 02/10/2020   11:10 AM 01/12/2020    4:25 PM 10/05/2019    1:18 PM 07/16/2018   10:50 AM 08/10/2017    6:26 PM  Advanced Directives  Does Patient Have a Medical Advance Directive? No No No No No No No  Would patient like information on creating a medical advance directive? No - Patient declined No - Patient declined No - Patient declined No - Patient declined Yes (MAU/Ambulatory/Procedural Areas - Information given) No - Patient declined Yes (ED - Information included in AVS)    Current Medications (verified) Outpatient Encounter Medications as of 10/12/2021  Medication Sig   Acetaminophen (TYLENOL PO) Take by mouth as needed.   Cholecalciferol 25 MCG (1000 UT) capsule Take 1,000 Units by mouth daily.   hydrochlorothiazide (MICROZIDE) 12.5 MG capsule TAKE 1 CAPSULE BY MOUTH EVERY DAY   isoniazid (NYDRAZID) 300 MG tablet Take 1 tablet (300 mg total) by mouth daily.   isoniazid (NYDRAZID) 300 MG tablet Take 1 tablet (300 mg total) by mouth daily.   vitamin B-12 (CYANOCOBALAMIN) 1000 MCG tablet Take 1,000 mcg by mouth daily.   No facility-administered encounter medications on file as of 10/12/2021.    Allergies (verified) Penicillins   History: Past Medical History:  Diagnosis Date   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HCC)    s/p craniectomy   Hypertension    Pre-diabetes    patient denies ; see lab result for a1c 03-06-17 is 6.0   Seizures (HCC)    AFTER BRAIN  SURGERY; none recently    Weakness of right side of body    d/t brain tumor intervention    Past Surgical History:  Procedure Laterality Date   ABDOMINAL HYSTERECTOMY  2010   ovaries not removed   BRAIN SURGERY     tumor excision s/p craniectomy   BREAST BIOPSY Left 2010   benign   CATARACT EXTRACTION W/PHACO Left 11/07/2016   Procedure: CATARACT EXTRACTION PHACO AND INTRAOCULAR LENS PLACEMENT (IOC);  Surgeon: Sallee Lange, MD;  Location: [facility] ORS;  Service: Ophthalmology;  Laterality: Left;  [place] 00:53.9 AP% 16.7 CDE 17.02 Fluid Pack Lot # [id number]   PARATHYROIDECTOMY Right 03/14/2017   Procedure: RIGHT PARATHYROIDECTOMY;  Surgeon: Darnell Level, MD;  Location: [facility];  Service: General;  Laterality: Right;   Family History  Problem Relation Age of Onset   Cancer Mother        ovary   Breast cancer Neg Hx    Colon cancer Neg Hx    Social History   Socioeconomic History   Marital status: Legally Separated    Spouse name: Not on file   Number of children: 1   Years of education: Not on file   Highest education level: Not on file  Occupational History    Employer: [employer]  Tobacco Use   Smoking status: Never  Smokeless tobacco: Never  Vaping Use   Vaping Use: Never used  Substance and Sexual Activity   Alcohol use: Yes    Alcohol/week: 0.0 standard drinks    Comment: seldom    Drug use: No   Sexual activity: Not on file  Other Topics Concern   Not on file  Social History Narrative   Not on file   Social Determinants of Health   Financial Resource Strain: Low Risk    Difficulty of Paying Living Expenses: Not hard at all  Food Insecurity: No Food Insecurity   Worried About Programme researcher, broadcasting/film/video in the Last Year: Never true   Ran Out of Food in the Last Year: Never true  Transportation Needs: No Transportation Needs   Lack of Transportation (Medical): No   Lack of Transportation (Non-Medical): No  Physical Activity: Not on file   Stress: No Stress Concern Present   Feeling of Stress : Not at all  Social Connections: Unknown   Frequency of Communication with Friends and Family: More than three times a week   Frequency of Social Gatherings with Friends and Family: More than three times a week   Attends Religious Services: Not on Scientist, clinical (histocompatibility and immunogenetics) or Organizations: Not on file   Attends Banker Meetings: Not on file   Marital Status: Not on file    Tobacco Counseling Counseling given: Not Answered   Clinical Intake:  Pre-visit preparation completed: Yes        Diabetes: No  How often do you need to have someone help you when you read instructions, pamphlets, or other written materials from your doctor or pharmacy?: 1 - Never    Interpreter Needed?: No    Activities of Daily Living    10/12/2021    2:15 PM  In your present state of health, do you have any difficulty performing the following activities:  Hearing? 0  Vision? 0  Difficulty concentrating or making decisions? 0  Walking or climbing stairs? 0  Dressing or bathing? 0  Doing errands, shopping? 0  Preparing Food and eating ? N  Using the Toilet? N  In the past six months, have you accidently leaked urine? N  Do you have problems with loss of bowel control? N  Managing your Medications? N  Managing your Finances? N  Housekeeping or managing your Housekeeping? N    Patient Care Team: Dale Eureka, MD as PCP - General (Internal Medicine)  Indicate any recent Medical Services you may have received from other than [facility] providers in the past year (date may be approximate).     Assessment:   This is a routine wellness examination for Rachael Zimmerman.  Virtual Visit via Telephone Note  I connected with  Rachael Zimmerman on 10/12/21 at  2:00 PM EDT by telephone and verified that I am speaking with the correct person using two identifiers.  Persons participating in the virtual visit: patient/Nurse Health Advisor    I discussed the limitations of performing an evaluation and management service by telehealth. The patient expressed understanding and agreed to proceed. We continued and completed visit with audio only. Some vital signs may be absent or patient reported.   Hearing/Vision screen Hearing Screening - Comments:: Patient is able to hear conversational tones without difficulty.  No issues reported. Vision Screening - Comments:: Followed by [facility] Wears glasses lenses for reading. Has Rx if additional strength is needed before next scheduled exam.  They have seen their  ophthalmologist in the last 12 months.   Dietary issues and exercise activities discussed: Current Exercise Habits: Home exercise routine, Intensity: Mild Healthy diet Good water   Goals Addressed               This Visit's Progress     Patient Stated     Maintain healthy lifestyle (pt-stated)        Stay active Healthy diet       Depression Screen    10/12/2021    2:12 PM 04/27/2021    8:23 AM 01/10/2021    8:17 AM 10/05/2020    1:22 PM 05/19/2020   10:05 AM 10/05/2019    1:51 PM 06/13/2017    9:10 AM  PHQ 2/9 Scores  PHQ - 2 Score 0 0 0 0 0 0 0    Fall Risk    10/12/2021    2:15 PM 01/10/2021    8:17 AM 10/05/2020    1:22 PM 05/19/2020   10:05 AM 10/05/2019    1:50 PM  Fall Risk   Falls in the past year? 0 0 0 0 0  Number falls in past yr: 0 0 0 0   Injury with Fall?  0 0 0   Follow up Falls evaluation completed Falls evaluation completed Falls evaluation completed Falls evaluation completed Falls evaluation completed    FALL RISK PREVENTION PERTAINING TO THE HOME: Home free of loose throw rugs in walkways, pet beds, electrical cords, etc? Yes  Adequate lighting in your home to reduce risk of falls? Yes   ASSISTIVE DEVICES UTILIZED TO PREVENT FALLS: Life alert? No  Use of a cane, walker or w/c? No   TIMED UP AND GO: Was the test performed? No .   Cognitive Function:  Patient is alert and  oriented x3.       10/05/2019    1:52 PM  6CIT Screen  What Year? 0 points  What month? 0 points  What time? 0 points  Months in reverse 0 points    Immunizations Immunization History  Administered Date(s) Administered   Influenza,inj,Quad PF,6+ Mos 03/25/2014, 05/01/2016, 03/06/2017, 05/07/2018, 03/28/2020, 04/27/2021   PFIZER(Purple Top)SARS-COV-2 Vaccination 08/20/2019, 09/15/2019   PPD Test 05/01/2021   TDAP status: Due, Education has been provided regarding the importance of this vaccine. Advised may receive this vaccine at local pharmacy or Health Dept. Aware to provide a copy of the vaccination record if obtained from local pharmacy or Health Dept. Verbalized acceptance and understanding. Deferred.   Shingrix Completed?: No.    Education has been provided regarding the importance of this vaccine. Patient has been advised to call insurance company to determine out of pocket expense if they have not yet received this vaccine. Advised may also receive vaccine at local pharmacy or Health Dept. Verbalized acceptance and understanding.  Screening Tests Health Maintenance  Topic Date Due   HIV Screening  10/18/2021 (Originally 10/26/1975)   COVID-19 Vaccine (3 - Pfizer risk series) 10/28/2021 (Originally 10/13/2019)   Zoster Vaccines- Shingrix (1 of 2) 01/12/2022 (Originally 10/26/1979)   TETANUS/TDAP  10/13/2022 (Originally 10/26/1979)   INFLUENZA VACCINE  01/09/2022   PAP SMEAR-Modifier  07/26/2022   MAMMOGRAM  09/26/2023   COLONOSCOPY (Pts 45-57yrs Insurance coverage will need to be confirmed)  12/30/2029   Hepatitis C Screening  Completed   HPV VACCINES  Aged Out   Health Maintenance There are no preventive care reminders to display for this patient.  Lung Cancer Screening: (Low Dose CT Chest recommended if Age  55-80 years, 30 pack-year currently smoking OR have quit w/in 15years.) does not qualify.   Vision Screening: Recommended annual ophthalmology exams for early detection  of glaucoma and other disorders of the eye.  Dental Screening: Recommended annual dental exams for proper oral hygiene  Community Resource Referral / Chronic Care Management: CRR required this visit?  No   CCM required this visit?  No      Plan:   Keep all routine maintenance appointments.   I have personally reviewed and noted the following in the patient's chart:   Medical and social history Use of alcohol, tobacco or illicit drugs  Current medications and supplements including opioid prescriptions.  Functional ability and status Nutritional status Physical activity Advanced directives List of other physicians Hospitalizations, surgeries, and ER visits in previous 12 months Vitals Screenings to include cognitive, depression, and falls Referrals and appointments  In addition, I have reviewed and discussed with patient certain preventive protocols, quality metrics, and best practice recommendations. A written personalized care plan for preventive services as well as general preventive health recommendations were provided to patient.     Ashok Pall, LPN   06/16/1094

## 2021-10-12 NOTE — Patient Instructions (Addendum)
?  Rachael Zimmerman , ?Thank you for taking time to come for your Medicare Wellness Visit. I appreciate your ongoing commitment to your health goals. Please review the following plan we discussed and let me know if I can assist you in the future.  ? ?These are the goals we discussed: ? Goals   ? ?  ? Patient Stated  ?   Maintain healthy lifestyle (pt-stated)   ?   Stay active ?Healthy diet ?  ? ?  ?  ?This is a list of the screening recommended for you and due dates:  ?Health Maintenance  ?Topic Date Due  ? HIV Screening  10/18/2021*  ? COVID-19 Vaccine (3 - Pfizer risk series) 10/28/2021*  ? Zoster (Shingles) Vaccine (1 of 2) 01/12/2022*  ? Tetanus Vaccine  10/13/2022*  ? Flu Shot  01/09/2022  ? Pap Smear  07/26/2022  ? Mammogram  09/26/2023  ? Colon Cancer Screening  12/30/2029  ? Hepatitis C Screening: USPSTF Recommendation to screen - Ages 39-79 yo.  Completed  ? HPV Vaccine  Aged Out  ?*Topic was postponed. The date shown is not the original due date.  ?  ?

## 2021-10-13 ENCOUNTER — Ambulatory Visit (LOCAL_COMMUNITY_HEALTH_CENTER): Payer: Self-pay | Admitting: Surgery

## 2021-10-13 VITALS — Wt 169.0 lb

## 2021-10-13 DIAGNOSIS — R7612 Nonspecific reaction to cell mediated immunity measurement of gamma interferon antigen response without active tuberculosis: Secondary | ICD-10-CM

## 2021-10-13 MED ORDER — ISONIAZID 300 MG PO TABS: 300.0000 mg | ORAL_TABLET | Freq: Every day | ORAL | 0 refills | Status: AC

## 2021-10-13 NOTE — Progress Notes (Signed)
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 ? ?Patient is doing well on current medication regimen. No n/v/f/c, eating normally, no concerning weight loss. Patient reports taking medications daily as prescribed. ? 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 Dispensed #30 300 mg capsules.  ? 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 If patient is asked by employer, school, or other institution to take a TB test, patient should supply proof of treatment completion and/or obtain a TB Screening at the health department or at patient's PCP. This information was also provided in writing in the patient's completion letter which was given to the patient today along with a TB treatment completion card.  ? ?A completion letter was also sent by fax to the patient's PCP, fax "completed" as follows: ? ?Dr. Einar Pheasant ?[place] ?Fax: [phone] ? ?All completion letters and completion card were sent for scanning into Epic. ? ?Patient was advised to contact [facility]/TB control phone for any concerning symptoms or questions.   ? ?Leigh Aurora, MD  ?

## 2021-10-18 ENCOUNTER — Ambulatory Visit (INDEPENDENT_AMBULATORY_CARE_PROVIDER_SITE_OTHER): Payer: Medicare Other | Admitting: Internal Medicine

## 2021-10-18 ENCOUNTER — Encounter: Payer: Self-pay | Admitting: Internal Medicine

## 2021-10-18 VITALS — BP 134/88 | HR 85 | Temp 97.6°F | Resp 16 | Ht 62.0 in | Wt 170.0 lb

## 2021-10-18 DIAGNOSIS — E210 Primary hyperparathyroidism: Secondary | ICD-10-CM

## 2021-10-18 DIAGNOSIS — R7612 Nonspecific reaction to cell mediated immunity measurement of gamma interferon antigen response without active tuberculosis: Secondary | ICD-10-CM

## 2021-10-18 DIAGNOSIS — Z1211 Encounter for screening for malignant neoplasm of colon: Secondary | ICD-10-CM | POA: Diagnosis not present

## 2021-10-18 DIAGNOSIS — R21 Rash and other nonspecific skin eruption: Secondary | ICD-10-CM

## 2021-10-18 DIAGNOSIS — Z87898 Personal history of other specified conditions: Secondary | ICD-10-CM | POA: Diagnosis not present

## 2021-10-18 DIAGNOSIS — D329 Benign neoplasm of meninges, unspecified: Secondary | ICD-10-CM

## 2021-10-18 DIAGNOSIS — R739 Hyperglycemia, unspecified: Secondary | ICD-10-CM

## 2021-10-18 DIAGNOSIS — I10 Essential (primary) hypertension: Secondary | ICD-10-CM | POA: Diagnosis not present

## 2021-10-18 DIAGNOSIS — G8191 Hemiplegia, unspecified affecting right dominant side: Secondary | ICD-10-CM

## 2021-10-18 LAB — LIPID PANEL
Cholesterol: 147 mg/dL (ref 0–200)
HDL: 53 mg/dL (ref >39.00)
LDL Cholesterol: 85 mg/dL (ref 0–99)
NonHDL: 94.29
Total CHOL/HDL Ratio: 3
Triglycerides: 45 mg/dL (ref 0.0–149.0)
VLDL: 9 mg/dL (ref 0.0–40.0)

## 2021-10-18 LAB — HEPATIC FUNCTION PANEL
ALT: 11 U/L (ref 0–35)
AST: 12 U/L (ref 0–37)
Albumin: 4.1 g/dL (ref 3.5–5.2)
Alkaline Phosphatase: 73 U/L (ref 39–117)
Bilirubin, Direct: 0.1 mg/dL (ref 0.0–0.3)
Total Bilirubin: 0.5 mg/dL (ref 0.2–1.2)
Total Protein: 6.4 g/dL (ref 6.0–8.3)

## 2021-10-18 LAB — BASIC METABOLIC PANEL
BUN: 17 mg/dL (ref 6–23)
CO2: 27 mEq/L (ref 19–32)
Calcium: 9.8 mg/dL (ref 8.4–10.5)
Chloride: 107 mEq/L (ref 96–112)
Creatinine, Ser: 0.93 mg/dL (ref 0.40–1.20)
GFR: 66.58 mL/min (ref >60.00)
Glucose, Bld: 97 mg/dL (ref 70–99)
Potassium: 4 mEq/L (ref 3.5–5.1)
Sodium: 140 mEq/L (ref 135–145)

## 2021-10-18 LAB — HEMOGLOBIN A1C: Hgb A1c MFr Bld: 5.8 % (ref 4.6–6.5)

## 2021-10-18 NOTE — Progress Notes (Signed)
Patient ID: Rachael Zimmerman, female   DOB: [birth year], 61 y.o.   MRN: [id number] ? ? ?Subjective:  ? ? Patient ID: Rachael Zimmerman, female    DOB: Jun 23, 1960, 61 y.o.   MRN: [id number] ? ?This visit occurred during the SARS-CoV-2 public health emergency.  Safety protocols were in place, including screening questions prior to the visit, additional usage of staff PPE, and extensive cleaning of exam room while observing appropriate contact time as indicated for disinfecting solutions.  ? ?Patient here for a scheduled follow up.  ? ?Chief Complaint  ?Patient presents with  ? Follow-up  ?  Follow up for HTN  ? .  ? 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  Has been followed by ID.  Treated.  Completing last month of isoniazid.  She was seen at [facility]/2021 for rash.  Localized upper chest - right shoulder.  Treated with prednisone and antihistamine.  Resolved.  Continues to have intermittent rash.  Few lesions posterior neck (up to hair line) and few places on her right arm.  Intermittent flares.  No new exposures. No chest pain or sob reported.  No cough or congestion.  No abdominal pain.  Bowels moving.   ? ? ?Past Medical History:  ?Diagnosis Date  ? 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[place])   ? s/p craniectomy  ? Hypertension   ? Pre-diabetes   ? patient denies ; see lab result for a1c 03-06-17 is 6.0  ? Seizures ([place])   ? AFTER BRAIN SURGERY; none recently   ? Weakness of right side of body   ? d/t brain tumor intervention   ? ?Past Surgical History:  ?Procedure Laterality Date  ? ABDOMINAL HYSTERECTOMY  2010  ? ovaries not removed  ? BRAIN SURGERY    ? tumor excision s/p craniectomy  ? BREAST BIOPSY Left 2010  ? benign  ? CATARACT EXTRACTION W/PHACO Left 11/07/2016  ? Procedure: CATARACT EXTRACTION PHACO AND INTRAOCULAR LENS PLACEMENT (IOC);  Surgeon: Estill Cotta, MD;  Location: [facility] ORS;  Service: Ophthalmology;  Laterality: Left;  [place] 00:53.9 ?AP% 16.7 ?CDE 17.02 ?Fluid Pack Lot # [id number]  ? PARATHYROIDECTOMY Right  03/14/2017  ? Procedure: RIGHT PARATHYROIDECTOMY;  Surgeon: Armandina Gemma, MD;  Location: [facility] ORS;  Service: General;  Laterality: Right;  ? ?Family History  ?Problem Relation Age of Onset  ? Cancer Mother   ?     ovary  ? Breast cancer Neg Hx   ? Colon cancer Neg Hx   ? ?Social History  ? ?Socioeconomic History  ? Marital status: Legally Separated  ?  Spouse name: Not on file  ? Number of children: 1  ? Years of education: Not on file  ? Highest education level: Not on file  ?Occupational History  ?  Employer: [employer]  ?Tobacco Use  ? Smoking status: Never  ? Smokeless tobacco: Never  ?Vaping Use  ? Vaping Use: Never used  ?Substance and Sexual Activity  ? Alcohol use: Yes  ?  Alcohol/week: 0.0 standard drinks  ?  Comment: seldom   ? Drug use: No  ? Sexual activity: Not on file  ?Other Topics Concern  ? Not on file  ?Social History Narrative  ? Not on file  ? ?Social Determinants of Health  ? ?Financial Resource Strain: Low Risk   ? Difficulty of Paying Living Expenses: Not hard at all  ?Food Insecurity: No Food Insecurity  ? Worried About Charity fundraiser in  the Last Year: Never true  ? Ran Out of Food in the Last Year: Never true  ?Transportation Needs: No Transportation Needs  ? Lack of Transportation (Medical): No  ? Lack of Transportation (Non-Medical): No  ?Physical Activity: Not on file  ?Stress: No Stress Concern Present  ? Feeling of Stress : Not at all  ?Social Connections: Unknown  ? Frequency of Communication with Friends and Family: More than three times a week  ? Frequency of Social Gatherings with Friends and Family: More than three times a week  ? Attends Religious Services: Not on file  ? Active Member of Clubs or Organizations: Not on file  ? Attends Archivist Meetings: Not on file  ? Marital Status: Not on file  ? ? ? ?Review of Systems  ?Constitutional:  Negative for appetite change and unexpected weight change.  ?HENT:  Negative for congestion and sinus  pressure.   ?Respiratory:  Negative for cough, chest tightness and shortness of breath.   ?Cardiovascular:  Negative for chest pain, palpitations and leg swelling.  ?Gastrointestinal:  Negative for abdominal pain, diarrhea, nausea and vomiting.  ?Genitourinary:  Negative for difficulty urinating and dysuria.  ?Musculoskeletal:  Negative for joint swelling and myalgias.  ?Skin:  Positive for rash. Negative for color change.  ?Neurological:  Negative for dizziness, light-headedness and headaches.  ?Psychiatric/Behavioral:  Negative for agitation and dysphoric mood.   ? ?   ?Objective:  ?  ? ?BP 134/88 (BP Location: Left Arm, Patient Position: Sitting, Cuff Size: Small)   Pulse 85   Temp 97.6 ?F (36.4 ?C) (Temporal)   Resp 16   Ht '5\' 2"'$  (1.575 m)   Wt 170 lb (77.1 kg)   SpO2 99%   BMI 31.09 kg/m?  ?Wt Readings from Last 3 Encounters:  ?10/18/21 170 lb (77.1 kg)  ?10/13/21 169 lb (76.7 kg)  ?10/12/21 169 lb (76.7 kg)  ? ? ?Physical Exam ?Vitals reviewed.  ?Constitutional:   ?   General: She is not in acute distress. ?   Appearance: Normal appearance.  ?HENT:  ?   Head: Normocephalic and atraumatic.  ?   Right Ear: External ear normal.  ?   Left Ear: External ear normal.  ?Eyes:  ?   General: No scleral icterus.    ?   Right eye: No discharge.     ?   Left eye: No discharge.  ?   Conjunctiva/sclera: Conjunctivae normal.  ?Neck:  ?   Thyroid: No thyromegaly.  ?Cardiovascular:  ?   Rate and Rhythm: Normal rate and regular rhythm.  ?Pulmonary:  ?   Effort: No respiratory distress.  ?   Breath sounds: Normal breath sounds. No wheezing.  ?Abdominal:  ?   General: Bowel sounds are normal.  ?   Palpations: Abdomen is soft.  ?   Tenderness: There is no abdominal tenderness.  ?Musculoskeletal:     ?   General: No swelling or tenderness.  ?   Cervical back: Neck supple. No tenderness.  ?Lymphadenopathy:  ?   Cervical: No cervical adenopathy.  ?Skin: ?   Findings: No erythema.  ?   Comments: Several raised lesions -  posterior neck - towards hairline.  Few lesions - right elbow.   ?Neurological:  ?   Mental Status: She is alert.  ?Psychiatric:     ?   Mood and Affect: Mood normal.     ?   Behavior: Behavior normal.  ? ? ? ?Outpatient Encounter Medications as of  10/18/2021  ?Medication Sig  ? Acetaminophen (TYLENOL PO) Take by mouth as needed.  ? Cholecalciferol 25 MCG (1000 UT) capsule Take 1,000 Units by mouth daily.  ? hydrochlorothiazide (MICROZIDE) 12.5 MG capsule TAKE 1 CAPSULE BY MOUTH EVERY DAY  ? isoniazid (NYDRAZID) 300 MG tablet Take 1 tablet (300 mg total) by mouth daily.  ? isoniazid (NYDRAZID) 300 MG tablet Take 1 tablet (300 mg total) by mouth daily.  ? isoniazid (NYDRAZID) 300 MG tablet Take 1 tablet (300 mg total) by mouth daily.  ? vitamin B-12 (CYANOCOBALAMIN) 1000 MCG tablet Take 1,000 mcg by mouth daily.  ? ?No facility-administered encounter medications on file as of 10/18/2021.  ?  ? ?Lab Results  ?Component Value Date  ? WBC 7.8 03/28/2020  ? HGB 12.9 03/28/2020  ? HCT 38.4 03/28/2020  ? PLT 155.0 03/28/2020  ? GLUCOSE 97 10/18/2021  ? CHOL 147 10/18/2021  ? TRIG 45.0 10/18/2021  ? HDL 53.00 10/18/2021  ? [place] 85 10/18/2021  ? ALT 11 10/18/2021  ? AST 12 10/18/2021  ? NA 140 10/18/2021  ? K 4.0 10/18/2021  ? CL 107 10/18/2021  ? CREATININE 0.93 10/18/2021  ? BUN 17 10/18/2021  ? CO2 27 10/18/2021  ? TSH 1.89 03/28/2020  ? HGBA1C 5.8 10/18/2021  ? ? ?MM 3D SCREEN BREAST BILATERAL ? ?Result Date: 09/25/2021 ?CLINICAL DATA:  Screening. EXAM: DIGITAL SCREENING BILATERAL MAMMOGRAM WITH TOMOSYNTHESIS AND CAD TECHNIQUE: Bilateral screening digital craniocaudal and mediolateral oblique mammograms were obtained. Bilateral screening digital breast tomosynthesis was performed. The images were evaluated with computer-aided detection. COMPARISON:  Previous exam(s). ACR Breast Density Category b: There are scattered areas of fibroglandular density. FINDINGS: There are no findings suspicious for malignancy. IMPRESSION:  No mammographic evidence of malignancy. A result letter of this screening mammogram will be mailed directly to the patient. RECOMMENDATION: Screening mammogram in one year. (Code:SM-B-01Y) BI-RADS CATEGORY  1: Negative. Elect

## 2021-10-19 ENCOUNTER — Encounter: Payer: Self-pay | Admitting: Internal Medicine

## 2021-10-19 DIAGNOSIS — G8191 Hemiplegia, unspecified affecting right dominant side: Secondary | ICD-10-CM | POA: Insufficient documentation

## 2021-10-19 DIAGNOSIS — D329 Benign neoplasm of meninges, unspecified: Secondary | ICD-10-CM | POA: Insufficient documentation

## 2021-10-19 DIAGNOSIS — R21 Rash and other nonspecific skin eruption: Secondary | ICD-10-CM | POA: Insufficient documentation

## 2021-10-19 NOTE — Assessment & Plan Note (Signed)
Blood pressure as outlined.  Continue hctz.  Follow pressures.  Follow metabolic panel.  

## 2021-10-19 NOTE — Assessment & Plan Note (Signed)
Overall relatively stable.  Follow.  ?

## 2021-10-19 NOTE — Assessment & Plan Note (Signed)
MRI 11/2020 - Stable appearance of small dural-based extra axial lesion in the right sphenoid wing, most consistent with a small meningioma. Recommended f/u MRI q 5-10 years. ? ?

## 2021-10-19 NOTE — Assessment & Plan Note (Signed)
Low carb diet and exercise.  Follow met b and a1c.  ?

## 2021-10-19 NOTE — Assessment & Plan Note (Addendum)
S/p excision and craniectomy.  Saw neurology. MRI stable.  Recommended f/u 5-10 years.  Last seen 03/2021.  ?

## 2021-10-19 NOTE — Assessment & Plan Note (Signed)
Colonoscopy July 2021 revealed small lipoma in the sigmoid colon and internal hemorrhoids.  Otherwise normal. Need copy of colonoscopy.  ?

## 2021-10-19 NOTE — Assessment & Plan Note (Signed)
Has been seeing ID.  Completing last month of isoniazid.   ?

## 2021-10-19 NOTE — Assessment & Plan Note (Signed)
S/p parathyroidectomy.  Has been followed by followed by Dr Harlow Asa.  Follow calcium.  Normal bone density 2019.  Plan f/u bone density.   ?

## 2021-10-19 NOTE — Assessment & Plan Note (Signed)
Persistent intermittent rash. Previously treated with prednisone and antihistamine.  Has antihistamine to take prn.  Topical steroid cream.  No new exposures or known triggers.  She is interested in referral to an allergist to see if can determine etiology given persistent intermittent issue.  ?

## 2021-10-24 ENCOUNTER — Telehealth: Payer: Self-pay

## 2021-10-24 NOTE — Telephone Encounter (Signed)
Lm for pt re : results ? ?I reviewed your recent lab results and your overall sugar control and your cholesterol levels have improved.  Your kidney function tests and liver function tests are within normal limits.  Let us know if you need anything.   ?  ?It was nice seeing you  ?Dr Nicki Reaper ?

## 2021-12-19 ENCOUNTER — Other Ambulatory Visit: Payer: Self-pay | Admitting: Internal Medicine

## 2021-12-19 ENCOUNTER — Telehealth: Payer: Self-pay

## 2021-12-19 MED ORDER — HYDROCHLOROTHIAZIDE 12.5 MG PO CAPS: ORAL_CAPSULE | ORAL | 1 refills | Status: DC

## 2021-12-19 NOTE — Telephone Encounter (Signed)
Patient states she is out of her hydrochlorothiazide (MICROZIDE) 12.5 MG capsule and would like to request a refill.  Patient states she would like for [place] to call her when the prescription has been sent to the pharmacy.  *Patient states her preferred pharmacy is [facility] [address] in [place].

## 2021-12-19 NOTE — Telephone Encounter (Signed)
Pt has been refilled and pt is aware.

## 2021-12-31 ENCOUNTER — Emergency Department: Payer: Medicare Other

## 2021-12-31 ENCOUNTER — Emergency Department
Admission: EM | Admit: 2021-12-31 | Discharge: 2021-12-31 | Disposition: A | Discharge status: Home / Self Care | Payer: Medicare Other | Attending: Emergency Medicine | Admitting: Emergency Medicine

## 2021-12-31 ENCOUNTER — Encounter: Payer: Self-pay | Admitting: Emergency Medicine

## 2021-12-31 ENCOUNTER — Other Ambulatory Visit: Payer: Self-pay

## 2021-12-31 DIAGNOSIS — M79651 Pain in right thigh: Secondary | ICD-10-CM | POA: Diagnosis present

## 2021-12-31 DIAGNOSIS — S3992XA Unspecified injury of lower back, initial encounter: Secondary | ICD-10-CM | POA: Diagnosis not present

## 2021-12-31 DIAGNOSIS — D179 Benign lipomatous neoplasm, unspecified: Secondary | ICD-10-CM | POA: Diagnosis not present

## 2021-12-31 DIAGNOSIS — M5126 Other intervertebral disc displacement, lumbar region: Secondary | ICD-10-CM | POA: Diagnosis not present

## 2021-12-31 DIAGNOSIS — M4807 Spinal stenosis, lumbosacral region: Secondary | ICD-10-CM | POA: Insufficient documentation

## 2021-12-31 DIAGNOSIS — Y92000 Kitchen of unspecified non-institutional (private) residence as  the place of occurrence of the external cause: Secondary | ICD-10-CM | POA: Insufficient documentation

## 2021-12-31 DIAGNOSIS — R296 Repeated falls: Secondary | ICD-10-CM | POA: Diagnosis not present

## 2021-12-31 DIAGNOSIS — S79911A Unspecified injury of right hip, initial encounter: Secondary | ICD-10-CM | POA: Diagnosis not present

## 2021-12-31 DIAGNOSIS — W010XXA Fall on same level from slipping, tripping and stumbling without subsequent striking against object, initial encounter: Secondary | ICD-10-CM | POA: Diagnosis not present

## 2021-12-31 DIAGNOSIS — M5127 Other intervertebral disc displacement, lumbosacral region: Secondary | ICD-10-CM | POA: Diagnosis not present

## 2021-12-31 DIAGNOSIS — M25551 Pain in right hip: Secondary | ICD-10-CM | POA: Diagnosis not present

## 2021-12-31 DIAGNOSIS — M4316 Spondylolisthesis, lumbar region: Secondary | ICD-10-CM | POA: Diagnosis not present

## 2021-12-31 DIAGNOSIS — W19XXXA Unspecified fall, initial encounter: Secondary | ICD-10-CM

## 2021-12-31 MED ORDER — HYDROCODONE-ACETAMINOPHEN 5-325 MG PO TABS
1.0000 | ORAL_TABLET | Freq: Four times a day (QID) | ORAL | 0 refills | Status: AC | PRN
Start: 2021-12-31 — End: 2022-01-03

## 2021-12-31 MED ORDER — ONDANSETRON 4 MG PO TBDP: 4.0000 mg | ORAL_TABLET | Freq: Three times a day (TID) | ORAL | 0 refills | Status: AC | PRN

## 2021-12-31 MED ORDER — ONDANSETRON 4 MG PO TBDP
4.0000 mg | ORAL_TABLET | Freq: Once | ORAL | Status: AC
  Administered 2021-12-31: 4 mg via ORAL
  Filled 2021-12-31: qty 1

## 2021-12-31 MED ORDER — OXYCODONE-ACETAMINOPHEN 5-325 MG PO TABS
1.0000 | ORAL_TABLET | Freq: Once | ORAL | Status: AC
  Administered 2021-12-31: 1 via ORAL
  Filled 2021-12-31: qty 1

## 2021-12-31 MED ORDER — METHYLPREDNISOLONE 4 MG PO TBPK: ORAL_TABLET | ORAL | 0 refills | Status: DC

## 2021-12-31 NOTE — ED Triage Notes (Signed)
Pt reports falling on Wednesday and today. Pt reports that she tripped over a rub on Wednesday and tripped over her feet today. Denies hitting head. C/O right hip pain.

## 2021-12-31 NOTE — ED Notes (Signed)
See triage note. 2 recent mechanical falls with R hip pain at this time.

## 2021-12-31 NOTE — Discharge Instructions (Addendum)
Take Medrol Dosepak as directed. You have been prescribed a short course of Norco for pain. You can follow-up with Dr. Roland Rack as needed.

## 2021-12-31 NOTE — ED Provider Notes (Signed)
[facility] Provider Note  Patient Contact: 4:15 PM (approximate)   History   Fall   HPI  Rachael Zimmerman is a 61 y.o. female presents to the emergency department with right inner thigh pain and right lateral hip pain.  Patient has had 2 recent falls.  She states that she tripped over a mat in her kitchen on Wednesday.  She states that her leg "gave out" today and she has had worsening pain.  She denies hitting her head or neck.  No numbness or tingling in the upper and lower extremities.  No bowel or bladder incontinence.      Physical Exam   Triage Vital Signs: ED Triage Vitals  Enc Vitals Group     BP 12/31/21 1433 119/70     Pulse Rate 12/31/21 1431 96     Resp 12/31/21 1431 17     Temp 12/31/21 1431 98.1 F (36.7 C)     Temp Source 12/31/21 1431 Oral     SpO2 12/31/21 1431 98 %     Weight --      Height --      Head Circumference --      Peak Flow --      Pain Score 12/31/21 1431 9     Pain Loc --      Pain Edu? --      Excl. in [place]? --     Most recent vital signs: Vitals:   12/31/21 1431 12/31/21 1433  BP:  119/70  Pulse: 96   Resp: 17   Temp: 98.1 F (36.7 C)   SpO2: 98%      General: Alert and in no acute distress. Eyes:  PERRL. EOMI. Head: No acute traumatic findings ENT:      Nose: No congestion/rhinnorhea.      Mouth/Throat: Mucous membranes are moist. Neck: No stridor. No cervical spine tenderness to palpation. Cardiovascular:  Good peripheral perfusion Respiratory: Normal respiratory effort without tachypnea or retractions. Lungs CTAB. Good air entry to the bases with no decreased or absent breath sounds. Gastrointestinal: Bowel sounds 4 quadrants. Soft and nontender to palpation. No guarding or rigidity. No palpable masses. No distention. No CVA tenderness. Musculoskeletal: Patient has symmetric strength in the lower extremities.  Plantarflexion intact.  Positive straight leg raise on the right.  Palpable dorsalis  pedis pulse bilaterally and symmetrically. Neurologic:  No gross focal neurologic deficits are appreciated.  Skin:   No rash noted Other:   ED Results / Procedures / Treatments   Labs (all labs ordered are listed, but only abnormal results are displayed) Labs Reviewed - No data to display     RADIOLOGY  I personally viewed and evaluated these images as part of my medical decision making, as well as reviewing the written report by the radiologist.  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  CT right hip unremarkable.   PROCEDURES:  Critical Care performed: No  Procedures   MEDICATIONS ORDERED IN ED: Medications  oxyCODONE-acetaminophen (PERCOCET/ROXICET) 5-325 MG per tablet 1 tablet (1 tablet Oral Given 12/31/21 1711)  ondansetron (ZOFRAN-ODT) disintegrating tablet 4 mg (4 mg Oral Given 12/31/21 1711)     IMPRESSION / MDM / ASSESSMENT AND PLAN / ED COURSE  I reviewed the triage vital signs and the nursing notes.  Assessment and plan: Fall:  60 year old female presents to the emergency department with 2 recent falls.  Patient had some foraminal stenosis with associated disc bulge at L5-S1 but no acute abnormality on CT of the right hip.  We will treat with Medrol Dosepak and short course of Norco for pain and have patient follow-up with orthopedics as needed.     FINAL CLINICAL IMPRESSION(S) / ED DIAGNOSES   Final diagnoses:  Fall, initial encounter     Rx / DC Orders   ED Discharge Orders          Ordered    methylPREDNISolone (MEDROL DOSEPAK) 4 MG TBPK tablet        12/31/21 1836    HYDROcodone-acetaminophen (NORCO) 5-325 MG tablet  Every 6 hours PRN        12/31/21 1838    ondansetron (ZOFRAN-ODT) 4 MG disintegrating tablet  Every 8 hours PRN        12/31/21 1838             Note:  This document was prepared using Dragon voice  recognition software and may include unintentional dictation errors.   Vallarie Mare [place], PA-C 12/31/21 1920    Naaman Plummer, MD 12/31/21 [phone]

## 2022-02-22 ENCOUNTER — Encounter: Payer: Medicare Other | Admitting: Internal Medicine

## 2022-06-19 ENCOUNTER — Other Ambulatory Visit: Payer: Self-pay | Admitting: Internal Medicine

## 2022-06-27 DIAGNOSIS — H26492 Other secondary cataract, left eye: Secondary | ICD-10-CM | POA: Diagnosis not present

## 2022-06-27 DIAGNOSIS — H2511 Age-related nuclear cataract, right eye: Secondary | ICD-10-CM | POA: Diagnosis not present

## 2022-07-04 DIAGNOSIS — H26492 Other secondary cataract, left eye: Secondary | ICD-10-CM | POA: Diagnosis not present

## 2022-07-17 DIAGNOSIS — H2511 Age-related nuclear cataract, right eye: Secondary | ICD-10-CM | POA: Diagnosis not present

## 2022-07-18 NOTE — Discharge Instructions (Signed)

## 2022-07-19 ENCOUNTER — Encounter: Payer: Self-pay | Admitting: Ophthalmology

## 2022-07-23 ENCOUNTER — Ambulatory Visit
Admission: RE | Admit: 2022-07-23 | Discharge: 2022-07-23 | Disposition: A | Discharge status: Home / Self Care | Payer: Medicare Other | Attending: Ophthalmology | Admitting: Ophthalmology

## 2022-07-23 ENCOUNTER — Other Ambulatory Visit: Payer: Self-pay

## 2022-07-23 ENCOUNTER — Encounter
Admission: RE | Disposition: A | Discharge status: Home / Self Care | Payer: Self-pay | Source: Home / Self Care | Attending: Ophthalmology

## 2022-07-23 ENCOUNTER — Ambulatory Visit: Payer: Medicare Other | Admitting: Anesthesiology

## 2022-07-23 DIAGNOSIS — I10 Essential (primary) hypertension: Secondary | ICD-10-CM | POA: Insufficient documentation

## 2022-07-23 DIAGNOSIS — H2511 Age-related nuclear cataract, right eye: Secondary | ICD-10-CM | POA: Diagnosis not present

## 2022-07-23 HISTORY — PX: CATARACT EXTRACTION W/PHACO: SHX586

## 2022-07-23 SURGERY — PHACOEMULSIFICATION, CATARACT, WITH IOL INSERTION
Anesthesia: Monitor Anesthesia Care | Laterality: Right

## 2022-07-23 MED ORDER — ACETAMINOPHEN 160 MG/5ML PO SOLN: 325.0000 mg | ORAL | Status: DC | PRN

## 2022-07-23 MED ORDER — ARMC OPHTHALMIC DILATING DROPS
1.0000 | OPHTHALMIC | Status: DC | PRN
  Administered 2022-07-23 (×3): 1 via OPHTHALMIC

## 2022-07-23 MED ORDER — MOXIFLOXACIN HCL 0.5 % OP SOLN
OPHTHALMIC | Status: DC | PRN
  Administered 2022-07-23: .2 mL via OPHTHALMIC

## 2022-07-23 MED ORDER — SIGHTPATH DOSE#1 BSS IO SOLN
INTRAOCULAR | Status: DC | PRN
  Administered 2022-07-23: 97 mL via OPHTHALMIC

## 2022-07-23 MED ORDER — LIDOCAINE HCL (PF) 2 % IJ SOLN
INTRAOCULAR | Status: DC | PRN
  Administered 2022-07-23: 1 mL via INTRAOCULAR

## 2022-07-23 MED ORDER — LACTATED RINGERS IV SOLN: INTRAVENOUS | Status: DC

## 2022-07-23 MED ORDER — ACETAMINOPHEN 325 MG PO TABS: 650.0000 mg | ORAL_TABLET | Freq: Once | ORAL | Status: DC | PRN

## 2022-07-23 MED ORDER — TETRACAINE HCL 0.5 % OP SOLN
1.0000 [drp] | OPHTHALMIC | Status: DC | PRN
  Administered 2022-07-23 (×3): 1 [drp] via OPHTHALMIC

## 2022-07-23 MED ORDER — FENTANYL CITRATE (PF) 100 MCG/2ML IJ SOLN
INTRAMUSCULAR | Status: DC | PRN
  Administered 2022-07-23: 50 ug via INTRAVENOUS

## 2022-07-23 MED ORDER — MIDAZOLAM HCL 2 MG/2ML IJ SOLN
INTRAMUSCULAR | Status: DC | PRN
  Administered 2022-07-23: 2 mg via INTRAVENOUS

## 2022-07-23 MED ORDER — SIGHTPATH DOSE#1 BSS IO SOLN
INTRAOCULAR | Status: DC | PRN
  Administered 2022-07-23: 15 mL

## 2022-07-23 MED ORDER — ONDANSETRON HCL 4 MG/2ML IJ SOLN: 4.0000 mg | Freq: Once | INTRAMUSCULAR | Status: DC | PRN

## 2022-07-23 MED ORDER — SIGHTPATH DOSE#1 NA HYALUR & NA CHOND-NA HYALUR IO KIT
PACK | INTRAOCULAR | Status: DC | PRN
  Administered 2022-07-23: 1 via OPHTHALMIC

## 2022-07-23 SURGICAL SUPPLY — 20 items
CANNULA ANT/CHMB 27G (MISCELLANEOUS) IMPLANT
CANNULA ANT/CHMB 27GA (MISCELLANEOUS) IMPLANT
CATARACT SUITE SIGHTPATH (MISCELLANEOUS) ×1 IMPLANT
DISSECTOR HYDRO NUCLEUS 50X22 (MISCELLANEOUS) ×1 IMPLANT
FEE CATARACT SUITE SIGHTPATH (MISCELLANEOUS) ×1 IMPLANT
GLOVE SURG GAMMEX PI TX LF 7.5 (GLOVE) ×1 IMPLANT
GLOVE SURG SYN 8.5  E (GLOVE) ×1
GLOVE SURG SYN 8.5 E (GLOVE) ×1 IMPLANT
GLOVE SURG SYN 8.5 PF PI (GLOVE) ×1 IMPLANT
LENS CLAREON WAGON WHEEL 21.0 (Intraocular Lens) ×1 IMPLANT
LENS IOL CLRN WGN WHL 21.0 (Intraocular Lens) IMPLANT
NDL FILTER BLUNT 18X1 1/2 (NEEDLE) ×1 IMPLANT
NEEDLE FILTER BLUNT 18X1 1/2 (NEEDLE) ×1 IMPLANT
PACK VIT ANT 23G (MISCELLANEOUS) IMPLANT
RING MALYGIN (MISCELLANEOUS) IMPLANT
SUT ETHILON 10-0 CS-B-6CS-B-6 (SUTURE)
SUTURE EHLN 10-0 CS-B-6CS-B-6 (SUTURE) IMPLANT
SYR 3ML LL SCALE MARK (SYRINGE) ×1 IMPLANT
SYR 5ML LL (SYRINGE) ×1 IMPLANT
WATER STERILE IRR 250ML POUR (IV SOLUTION) ×1 IMPLANT

## 2022-07-23 NOTE — Op Note (Signed)
OPERATIVE NOTE  ELLYN SHUMAKER [id number] 07/23/2022   PREOPERATIVE DIAGNOSIS:  Nuclear sclerotic cataract right eye.  H25.11   POSTOPERATIVE DIAGNOSIS:    Nuclear sclerotic cataract right eye.     PROCEDURE:  Phacoemusification with posterior chamber intraocular lens placement of the right eye   LENS:   Implant Name Type Inv. Item Serial No. Manufacturer Lot No. LRB No. Used Action  LENS CLAREON WAGON WHEEL 21.0 - [id number] Intraocular Lens LENS CLAREON WAGON WHEEL 21.0 [id number] [employer]  Right 1 Implanted       Procedure(s) with comments: CATARACT EXTRACTION PHACO AND INTRAOCULAR LENS PLACEMENT (IOC) RIGHT (Right) - 2.88 0:17.6  [id number] +21.0 [place] monofocal lens   ULTRASOUND TIME: 0 minutes 17 seconds.  CDE 2.88   SURGEON:  Benay Pillow, MD, MPH  ANESTHESIOLOGIST: Anesthesiologist: Darrin Nipper, MD CRNA: Jacqualin Combes, CRNA   ANESTHESIA:  Topical with tetracaine drops augmented with 1% preservative-free intracameral lidocaine.  ESTIMATED BLOOD LOSS: less than 1 mL.   COMPLICATIONS:  None.   DESCRIPTION OF PROCEDURE:  The patient was identified in the holding room and transported to the operating room and placed in the supine position under the operating microscope.  The right eye was identified as the operative eye and it was prepped and draped in the usual sterile ophthalmic fashion.   A 1.0 millimeter clear-corneal paracentesis was made at the 10:30 position. 0.5 ml of preservative-free 1% lidocaine with epinephrine was injected into the anterior chamber.  The anterior chamber was filled with viscoelastic.  A 2.4 millimeter keratome was used to make a near-clear corneal incision at the 8:00 position.  A curvilinear capsulorrhexis was made with a cystotome and capsulorrhexis forceps.  Balanced salt solution was used to hydrodissect and hydrodelineate the nucleus.   Phacoemulsification was then used in stop and chop fashion to remove the lens nucleus and  epinucleus.  The remaining cortex was then removed using the irrigation and aspiration handpiece. Viscoelastic was then placed into the capsular bag to distend it for lens placement.  A lens was then injected into the capsular bag.  The remaining viscoelastic was aspirated.   Wounds were hydrated with balanced salt solution.  The anterior chamber was inflated to a physiologic pressure with balanced salt solution.   Intracameral vigamox 0.1 mL undiluted was injected into the eye and a drop placed onto the ocular surface.  No wound leaks were noted.  The patient was taken to the recovery room in stable condition without complications of anesthesia or surgery  Benay Pillow 07/23/2022, 11:11 AM

## 2022-07-23 NOTE — Anesthesia Postprocedure Evaluation (Signed)
Anesthesia Post Note  Patient: Rachael Zimmerman  Procedure(s) Performed: CATARACT EXTRACTION PHACO AND INTRAOCULAR LENS PLACEMENT (IOC) RIGHT (Right)  Patient location during evaluation: PACU Anesthesia Type: MAC Level of consciousness: awake and alert, oriented and patient cooperative Pain management: pain level controlled Vital Signs Assessment: post-procedure vital signs reviewed and stable Respiratory status: spontaneous breathing, nonlabored ventilation and respiratory function stable Cardiovascular status: blood pressure returned to baseline and stable Postop Assessment: adequate PO intake Anesthetic complications: no   No notable events documented.   Last Vitals:  Vitals:   07/23/22 1112 07/23/22 1117  BP: 118/65 115/70  Pulse: 81 73  Resp: 14 12  Temp: (!) 36.2 C (!) 36.2 C  SpO2: 99% 99%    Last Pain:  Vitals:   07/23/22 1117  TempSrc:   PainSc: 0-No pain                 Darrin Nipper

## 2022-07-23 NOTE — Anesthesia Preprocedure Evaluation (Addendum)
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    GI/Hepatic negative GI ROS,,,  Endo/Other  negative endocrine ROS    Renal/GU negative Renal ROS     Musculoskeletal   Abdominal   Peds  Hematology negative hematology ROS (+)   Anesthesia Other Findings   Reproductive/Obstetrics                             Anesthesia Physical Anesthesia Plan  ASA: 2  Anesthesia Plan: MAC   Post-op Pain Management:    Induction: Intravenous  PONV Risk Score and Plan: 2 and Treatment may vary due to age or medical condition, Midazolam and TIVA  Airway Management Planned: Natural Airway and Nasal Cannula  Additional Equipment:   Intra-op Plan:   Post-operative Plan:   Informed Consent: I have reviewed the patients History and Physical, chart, labs and discussed the procedure including the risks, benefits and alternatives for the proposed anesthesia with the patient or authorized representative who has indicated his/her understanding and acceptance.     Dental advisory given  Plan Discussed with: CRNA  Anesthesia Plan Comments: (LMA/GETA backup discussed.  Patient consented for risks of anesthesia including but not limited to:  - adverse reactions to medications - damage to eyes, teeth, lips or other oral mucosa -  nerve damage due to positioning  - sore throat or hoarseness - damage to heart, brain, nerves, lungs, other parts of body or loss of life  Informed patient about role of CRNA in peri- and intra-operative care.  Patient voiced understanding.)       Anesthesia Quick Evaluation

## 2022-07-23 NOTE — H&P (Signed)
[facility]   Primary Care Physician:  Einar Pheasant, MD Ophthalmologist: Dr. Benay Pillow  Pre-Procedure History & Physical: HPI:  Rachael Zimmerman is a 62 y.o. female here for cataract surgery.   Past Medical History:  Diagnosis Date   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[place])    s/p craniectomy   Hypertension    Pre-diabetes    patient denies ; see lab result for a1c 03-06-17 is 6.0   Seizures ([place])    AFTER BRAIN SURGERY; none recently    Weakness of right side of body    d/t brain tumor intervention     Past Surgical History:  Procedure Laterality Date   ABDOMINAL HYSTERECTOMY  2010   ovaries not removed   BRAIN SURGERY     tumor excision s/p craniectomy   BREAST BIOPSY Left 2010   benign   CATARACT EXTRACTION W/PHACO Left 11/07/2016   Procedure: CATARACT EXTRACTION PHACO AND INTRAOCULAR LENS PLACEMENT ([place]);  Surgeon: Estill Cotta, MD;  Location: [facility] ORS;  Service: Ophthalmology;  Laterality: Left;  [place] 00:53.9 AP% 16.7 CDE 17.02 Fluid Pack Lot # [id number]   PARATHYROIDECTOMY Right 03/14/2017   Procedure: RIGHT PARATHYROIDECTOMY;  Surgeon: Armandina Gemma, MD;  Location: [facility] ORS;  Service: General;  Laterality: Right;    Prior to Admission medications   Medication Sig Start Date End Date Taking? Authorizing Provider  Acetaminophen (TYLENOL PO) Take by mouth as needed.   Yes [provider]  Cholecalciferol 25 MCG (1000 UT) capsule Take 1,000 Units by mouth daily.   Yes [provider]  hydrochlorothiazide (MICROZIDE) 12.5 MG capsule TAKE 1 CAPSULE BY MOUTH EVERY DAY 12/21/21  Yes Einar Pheasant, MD  hydrochlorothiazide (MICROZIDE) 12.5 MG capsule TAKE 1 CAPSULE BY MOUTH EVERY DAY 06/20/22  Yes Einar Pheasant, MD  isoniazid (NYDRAZID) 300 MG tablet Take 1 tablet (300 mg total) by mouth daily. Patient not taking: Reported on 07/23/2022 09/07/21   Caren Macadam, MD  vitamin B-12 (CYANOCOBALAMIN) 1000 MCG tablet Take 1,000 mcg by mouth daily.     [provider]    Allergies as of 07/09/2022 - Review Complete 12/31/2021  Allergen Reaction Noted   Penicillins Hives, Itching, and Swelling 09/11/2012    Family History  Problem Relation Age of Onset   Cancer Mother        ovary   Breast cancer Neg Hx    Colon cancer Neg Hx     Social History   Socioeconomic History   Marital status: Legally Separated    Spouse name: Not on file   Number of children: 1   Years of education: Not on file   Highest education level: Not on file  Occupational History    Employer: [employer]  Tobacco Use   Smoking status: Never   Smokeless tobacco: Never  Vaping Use   Vaping Use: Never used  Substance and Sexual Activity   Alcohol use: Yes    Alcohol/week: 0.0 standard drinks of alcohol    Comment: seldom    Drug use: No   Sexual activity: Not on file  Other Topics Concern   Not on file  Social History Narrative   Not on file   Social Determinants of Health   Financial Resource Strain: Low Risk  (10/12/2021)   Overall Financial Resource Strain (CARDIA)    Difficulty of Paying Living Expenses: Not hard at all  Food Insecurity: No Food Insecurity (10/12/2021)   Hunger Vital Sign    Worried About Running  Out of Food in the Last Year: Never true    Ran Out of Food in the Last Year: Never true  Transportation Needs: No Transportation Needs (10/12/2021)   PRAPARE - Hydrologist (Medical): No    Lack of Transportation (Non-Medical): No  Physical Activity: Not on file  Stress: No Stress Concern Present (10/12/2021)   [place]    Feeling of Stress : Not at all  Social Connections: Unknown (10/12/2021)   Social Connection and Isolation Panel [NHANES]    Frequency of Communication with Friends and Family: More than three times a week    Frequency of Social Gatherings with Friends and Family: More than three times a week     Attends Religious Services: Not on file    Active Member of [employer] or Organizations: Not on file    Attends Archivist Meetings: Not on file    Marital Status: Not on file  Intimate Partner Violence: Not At Risk (10/12/2021)   Humiliation, Afraid, Rape, and Kick questionnaire    Fear of Current or Ex-Partner: No    Emotionally Abused: No    Physically Abused: No    Sexually Abused: No    Review of Systems: See HPI, otherwise negative ROS  Physical Exam: BP (!) 149/73   Pulse 87   Temp 98.4 F (36.9 C) (Temporal)   Resp 18   Ht 5' 2"$  (1.575 m)   Wt 78 kg   SpO2 100%   BMI 31.46 kg/m  General:   Alert, cooperative in NAD Head:  Normocephalic and atraumatic. Respiratory:  Normal work of breathing. Cardiovascular:  RRR  Impression/Plan: Rachael Zimmerman is here for cataract surgery.  Risks, benefits, limitations, and alternatives regarding cataract surgery have been reviewed with the patient.  Questions have been answered.  All parties agreeable.   Benay Pillow, MD  07/23/2022, 10:43 AM

## 2022-07-23 NOTE — Transfer of Care (Signed)
Immediate Anesthesia Transfer of Care Note  Patient: Rachael Zimmerman  Procedure(s) Performed: CATARACT EXTRACTION PHACO AND INTRAOCULAR LENS PLACEMENT (IOC) RIGHT (Right)  Patient Location: PACU  Anesthesia Type: MAC  Level of Consciousness: awake, alert  and patient cooperative  Airway and Oxygen Therapy: Patient Spontanous Breathing and Patient connected to supplemental oxygen  Post-op Assessment: Post-op Vital signs reviewed, Patient's Cardiovascular Status Stable, Respiratory Function Stable, Patent Airway and No signs of Nausea or vomiting  Post-op Vital Signs: Reviewed and stable  Complications: No notable events documented.

## 2022-07-24 ENCOUNTER — Encounter: Payer: Self-pay | Admitting: Ophthalmology

## 2022-08-30 ENCOUNTER — Other Ambulatory Visit: Payer: Self-pay | Admitting: Obstetrics and Gynecology

## 2022-08-30 DIAGNOSIS — Z1231 Encounter for screening mammogram for malignant neoplasm of breast: Secondary | ICD-10-CM

## 2022-09-27 ENCOUNTER — Ambulatory Visit
Admission: RE | Admit: 2022-09-27 | Discharge: 2022-09-27 | Disposition: A | Discharge status: Home / Self Care | Payer: Medicare Other | Source: Ambulatory Visit | Attending: Obstetrics and Gynecology | Admitting: Obstetrics and Gynecology

## 2022-09-27 DIAGNOSIS — Z1231 Encounter for screening mammogram for malignant neoplasm of breast: Secondary | ICD-10-CM

## 2022-10-12 ENCOUNTER — Telehealth: Payer: Self-pay | Admitting: Internal Medicine

## 2022-10-12 NOTE — Telephone Encounter (Signed)
Copied from CRM [id number]. Topic: Medicare AWV >> Oct 12, 2022  3:09 PM Rushie Goltz wrote: Reason for CRM: Called patient to schedule Medicare Annual Wellness Visit (AWV). Left message for patient to call back and schedule Medicare Annual Wellness Visit (AWV).  Last date of AWV: 10/12/2021  Please schedule an AWVS appointment at any time with [facility] VISIT.  If any questions, please contact me at [phone].    Thank you,  [facility] Support [facility] Direct dial  [phone]

## 2022-12-07 ENCOUNTER — Telehealth: Payer: Self-pay | Admitting: Internal Medicine

## 2022-12-07 NOTE — Telephone Encounter (Signed)
LMTCB. Needs appt then can refill until appt. Has not been seen in over a year.

## 2022-12-07 NOTE — Telephone Encounter (Signed)
Prescription Request  12/07/2022  LOV: Visit date not found  What is the name of the medication or equipment? hydrochlorothiazide   Have you contacted your pharmacy to request a refill? Yes   Which pharmacy would you like this sent to?  [id number] [place], [place] - [address] [place] [address] Phone: [phone] Fax: [phone]    Patient notified that their request is being sent to the clinical staff for review and that they should receive a response within 2 business days.   Please advise at Mobile [phone] (mobile)

## 2022-12-14 ENCOUNTER — Other Ambulatory Visit: Payer: Self-pay

## 2022-12-14 MED ORDER — HYDROCHLOROTHIAZIDE 12.5 MG PO CAPS: 12.5000 mg | ORAL_CAPSULE | Freq: Every day | ORAL | 0 refills | Status: DC

## 2022-12-14 NOTE — Telephone Encounter (Signed)
Refilled for 30 days 

## 2022-12-14 NOTE — Telephone Encounter (Signed)
Patient returned our call and I scheduled her to see Dr. Dale [place] on Monday (12/17/2022).

## 2022-12-17 ENCOUNTER — Encounter: Payer: Self-pay | Admitting: Internal Medicine

## 2022-12-17 ENCOUNTER — Ambulatory Visit (INDEPENDENT_AMBULATORY_CARE_PROVIDER_SITE_OTHER): Payer: Medicare Other | Admitting: Internal Medicine

## 2022-12-17 VITALS — BP 136/78 | HR 83 | Temp 97.9°F | Resp 16 | Ht 63.0 in | Wt 167.6 lb

## 2022-12-17 DIAGNOSIS — E210 Primary hyperparathyroidism: Secondary | ICD-10-CM

## 2022-12-17 DIAGNOSIS — I10 Essential (primary) hypertension: Secondary | ICD-10-CM

## 2022-12-17 DIAGNOSIS — R7612 Nonspecific reaction to cell mediated immunity measurement of gamma interferon antigen response without active tuberculosis: Secondary | ICD-10-CM | POA: Diagnosis not present

## 2022-12-17 DIAGNOSIS — G8191 Hemiplegia, unspecified affecting right dominant side: Secondary | ICD-10-CM | POA: Diagnosis not present

## 2022-12-17 DIAGNOSIS — Z1211 Encounter for screening for malignant neoplasm of colon: Secondary | ICD-10-CM

## 2022-12-17 DIAGNOSIS — Z87898 Personal history of other specified conditions: Secondary | ICD-10-CM | POA: Diagnosis not present

## 2022-12-17 DIAGNOSIS — F439 Reaction to severe stress, unspecified: Secondary | ICD-10-CM

## 2022-12-17 DIAGNOSIS — R739 Hyperglycemia, unspecified: Secondary | ICD-10-CM

## 2022-12-17 DIAGNOSIS — D329 Benign neoplasm of meninges, unspecified: Secondary | ICD-10-CM | POA: Diagnosis not present

## 2022-12-17 LAB — LIPID PANEL
Cholesterol: 179 mg/dL (ref 0–200)
HDL: 59.2 mg/dL (ref >39.00)
LDL Cholesterol: 109 mg/dL — ABNORMAL HIGH (ref 0–99)
NonHDL: 119.47
Total CHOL/HDL Ratio: 3
Triglycerides: 53 mg/dL (ref 0.0–149.0)
VLDL: 10.6 mg/dL (ref 0.0–40.0)

## 2022-12-17 LAB — BASIC METABOLIC PANEL
BUN: 11 mg/dL (ref 6–23)
CO2: 27 mEq/L (ref 19–32)
Calcium: 10.3 mg/dL (ref 8.4–10.5)
Chloride: 108 mEq/L (ref 96–112)
Creatinine, Ser: 0.91 mg/dL (ref 0.40–1.20)
GFR: 67.79 mL/min (ref >60.00)
Glucose, Bld: 82 mg/dL (ref 70–99)
Potassium: 4.2 mEq/L (ref 3.5–5.1)
Sodium: 142 mEq/L (ref 135–145)

## 2022-12-17 LAB — HEPATIC FUNCTION PANEL
ALT: 15 U/L (ref 0–35)
AST: 13 U/L (ref 0–37)
Albumin: 3.9 g/dL (ref 3.5–5.2)
Alkaline Phosphatase: 64 U/L (ref 39–117)
Bilirubin, Direct: 0.1 mg/dL (ref 0.0–0.3)
Total Bilirubin: 0.6 mg/dL (ref 0.2–1.2)
Total Protein: 6.2 g/dL (ref 6.0–8.3)

## 2022-12-17 LAB — CBC WITH DIFFERENTIAL/PLATELET
Basophils Absolute: 0 10*3/uL (ref 0.0–0.1)
Basophils Relative: 0.3 % (ref 0.0–3.0)
Eosinophils Absolute: 0.1 10*3/uL (ref 0.0–0.7)
Eosinophils Relative: 1 % (ref 0.0–5.0)
HCT: 39.2 % (ref 36.0–46.0)
Hemoglobin: 12.9 g/dL (ref 12.0–15.0)
Lymphocytes Relative: 33.6 % (ref 12.0–46.0)
Lymphs Abs: 2.7 10*3/uL (ref 0.7–4.0)
MCHC: 32.9 g/dL (ref 30.0–36.0)
MCV: 87.2 fl (ref 78.0–100.0)
Monocytes Absolute: 0.4 10*3/uL (ref 0.1–1.0)
Monocytes Relative: 5.5 % (ref 3.0–12.0)
Neutro Abs: 4.8 10*3/uL (ref 1.4–7.7)
Neutrophils Relative %: 59.6 % (ref 43.0–77.0)
Platelets: 166 10*3/uL (ref 150.0–400.0)
RBC: 4.49 Mil/uL (ref 3.87–5.11)
RDW: 13.8 % (ref 11.5–15.5)
WBC: 8 10*3/uL (ref 4.0–10.5)

## 2022-12-17 LAB — HEMOGLOBIN A1C: Hgb A1c MFr Bld: 5.8 % (ref 4.6–6.5)

## 2022-12-17 LAB — TSH: TSH: 1.75 u[IU]/mL (ref 0.35–5.50)

## 2022-12-17 MED ORDER — HYDROCHLOROTHIAZIDE 12.5 MG PO CAPS: 12.5000 mg | ORAL_CAPSULE | Freq: Every day | ORAL | 1 refills | Status: DC

## 2022-12-17 NOTE — Progress Notes (Unsigned)
Subjective:    Patient ID: Rachael Zimmerman, female    DOB: 1961/04/18, 62 y.o.   MRN: [id number]  Patient here for  Chief Complaint  Patient presents with   Medical Management of Chronic Issues    HPI Here to follow up regarding hypertension.  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 Completed therapy.  She reports she is doing relatively well.  Increased stress.  Mother - breast cancer.  Overall she feels she is handling things relatively well.  Discussed.  Tries to stay active.  No chest pain or sob reported. No abdominal pain.  Has noticed (if up on her feet a lot), some increased swelling (recently).  Minimal swelling.  No swelling today.  Discussed compression hose.  Has been off hydrochlorothiazide x 2 weeks.     Past Medical History:  Diagnosis Date   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HCC)    s/p craniectomy   Hypertension    Pre-diabetes    patient denies ; see lab result for a1c 03-06-17 is 6.0   Seizures (HCC)    AFTER BRAIN SURGERY; none recently    Weakness of right side of body    d/t brain tumor intervention    Past Surgical History:  Procedure Laterality Date   ABDOMINAL HYSTERECTOMY  2010   ovaries not removed   BRAIN SURGERY     tumor excision s/p craniectomy   BREAST BIOPSY Left 2010   benign   CATARACT EXTRACTION W/PHACO Left 11/07/2016   Procedure: CATARACT EXTRACTION PHACO AND INTRAOCULAR LENS PLACEMENT (IOC);  Surgeon: Sallee Lange, MD;  Location: [facility] ORS;  Service: Ophthalmology;  Laterality: Left;  [place] 00:53.9 AP% 16.7 CDE 17.02 Fluid Pack Lot # [id number]   CATARACT EXTRACTION W/PHACO Right 07/23/2022   Procedure: CATARACT EXTRACTION PHACO AND INTRAOCULAR LENS PLACEMENT (IOC) RIGHT;  Surgeon: Nevada Crane, MD;  Location: [facility];  Service: Ophthalmology;  Laterality: Right;  2.88 0:17.6   PARATHYROIDECTOMY Right 03/14/2017   Procedure: RIGHT PARATHYROIDECTOMY;  Surgeon: Darnell Level, MD;  Location: [facility];  Service: General;   Laterality: Right;   Family History  Problem Relation Age of Onset   Cancer Mother        ovary   Breast cancer Neg Hx    Colon cancer Neg Hx    Social History   Socioeconomic History   Marital status: Legally Separated    Spouse name: Not on file   Number of children: 1   Years of education: Not on file   Highest education level: Not on file  Occupational History    Employer: [employer]  Tobacco Use   Smoking status: Never   Smokeless tobacco: Never  Vaping Use   Vaping Use: Never used  Substance and Sexual Activity   Alcohol use: Yes    Alcohol/week: 0.0 standard drinks of alcohol    Comment: seldom    Drug use: No   Sexual activity: Not on file  Other Topics Concern   Not on file  Social History Narrative   Not on file   Social Determinants of Health   Financial Resource Strain: Low Risk  (10/12/2021)   Overall Financial Resource Strain (CARDIA)    Difficulty of Paying Living Expenses: Not hard at all  Food Insecurity: No Food Insecurity (10/12/2021)   Hunger Vital Sign    Worried About Running Out of Food in the Last Year: Never true    Ran Out of Food in the Last  Year: Never true  Transportation Needs: No Transportation Needs (10/12/2021)   PRAPARE - Administrator, Civil Service (Medical): No    Lack of Transportation (Non-Medical): No  Physical Activity: Not on file  Stress: No Stress Concern Present (10/12/2021)   [employer] of Occupational Health - Occupational Stress Questionnaire    Feeling of Stress : Not at all  Social Connections: Unknown (10/12/2021)   Social Connection and Isolation Panel [NHANES]    Frequency of Communication with Friends and Family: More than three times a week    Frequency of Social Gatherings with Friends and Family: More than three times a week    Attends Religious Services: Not on Marketing executive or Organizations: Not on file    Attends Banker Meetings: Not on  file    Marital Status: Not on file     Review of Systems  Constitutional:  Negative for appetite change and unexpected weight change.  HENT:  Negative for congestion and sinus pressure.   Respiratory:  Negative for cough, chest tightness and shortness of breath.   Cardiovascular:  Negative for chest pain and palpitations.       No increased swelling today.   Gastrointestinal:  Negative for abdominal pain, diarrhea, nausea and vomiting.  Genitourinary:  Negative for difficulty urinating and dysuria.  Musculoskeletal:  Negative for joint swelling and myalgias.  Skin:  Negative for color change and rash.  Neurological:  Negative for dizziness and headaches.  Psychiatric/Behavioral:  Negative for agitation and dysphoric mood.        Objective:     BP 136/78   Pulse 83   Temp 97.9 F (36.6 C)   Resp 16   Ht 5\' 3"  (1.6 m)   Wt 167 lb 9.6 oz (76 kg)   SpO2 98%   BMI 29.69 kg/m  Wt Readings from Last 3 Encounters:  12/17/22 167 lb 9.6 oz (76 kg)  07/23/22 172 lb (78 kg)  10/18/21 170 lb (77.1 kg)    Physical Exam Vitals reviewed.  Constitutional:      General: She is not in acute distress.    Appearance: Normal appearance.  HENT:     Head: Normocephalic and atraumatic.     Right Ear: External ear normal.     Left Ear: External ear normal.  Eyes:     General: No scleral icterus.       Right eye: No discharge.        Left eye: No discharge.     Conjunctiva/sclera: Conjunctivae normal.  Neck:     Thyroid: No thyromegaly.  Cardiovascular:     Rate and Rhythm: Normal rate and regular rhythm.  Pulmonary:     Effort: No respiratory distress.     Breath sounds: Normal breath sounds. No wheezing.  Abdominal:     General: Bowel sounds are normal.     Palpations: Abdomen is soft.     Tenderness: There is no abdominal tenderness.  Musculoskeletal:        General: No swelling or tenderness.     Cervical back: Neck supple. No tenderness.  Lymphadenopathy:     Cervical:  No cervical adenopathy.  Skin:    Findings: No erythema or rash.  Neurological:     Mental Status: She is alert.  Psychiatric:        Mood and Affect: Mood normal.        Behavior: Behavior normal.  Outpatient Encounter Medications as of 12/17/2022  Medication Sig   Acetaminophen (TYLENOL PO) Take by mouth as needed.   Cholecalciferol 25 MCG (1000 UT) capsule Take 1,000 Units by mouth daily.   hydrochlorothiazide (MICROZIDE) 12.5 MG capsule Take 1 capsule (12.5 mg total) by mouth daily.   vitamin B-12 (CYANOCOBALAMIN) 1000 MCG tablet Take 1,000 mcg by mouth daily.   [DISCONTINUED] hydrochlorothiazide (MICROZIDE) 12.5 MG capsule Take 1 capsule (12.5 mg total) by mouth daily.   [DISCONTINUED] isoniazid (NYDRAZID) 300 MG tablet Take 1 tablet (300 mg total) by mouth daily. (Patient not taking: Reported on 07/23/2022)   No facility-administered encounter medications on file as of 12/17/2022.     Lab Results  Component Value Date   WBC 8.0 12/17/2022   HGB 12.9 12/17/2022   HCT 39.2 12/17/2022   PLT 166.0 12/17/2022   GLUCOSE 82 12/17/2022   CHOL 179 12/17/2022   TRIG 53.0 12/17/2022   HDL 59.20 12/17/2022   LDLCALC 109 (H) 12/17/2022   ALT 15 12/17/2022   AST 13 12/17/2022   NA 142 12/17/2022   K 4.2 12/17/2022   CL 108 12/17/2022   CREATININE 0.91 12/17/2022   BUN 11 12/17/2022   CO2 27 12/17/2022   TSH 1.75 12/17/2022   HGBA1C 5.8 12/17/2022    MM 3D SCREENING MAMMOGRAM BILATERAL BREAST  Result Date: 09/28/2022 CLINICAL DATA:  Screening. EXAM: DIGITAL SCREENING BILATERAL MAMMOGRAM WITH TOMOSYNTHESIS AND CAD TECHNIQUE: Bilateral screening digital craniocaudal and mediolateral oblique mammograms were obtained. Bilateral screening digital breast tomosynthesis was performed. The images were evaluated with computer-aided detection. COMPARISON:  Previous exam(s). ACR Breast Density Category b: There are scattered areas of fibroglandular density. FINDINGS: There are no findings  suspicious for malignancy. IMPRESSION: No mammographic evidence of malignancy. A result letter of this screening mammogram will be mailed directly to the patient. RECOMMENDATION: Screening mammogram in one year. (Code:SM-B-01Y) BI-RADS CATEGORY  1: Negative. Electronically Signed   By: Sherian Rein M.D.   On: 09/28/2022 11:47       Assessment & Plan:  Essential hypertension, benign Assessment & Plan: Blood pressure as outlined.  Restart hctz.  Follow pressures.  Follow metabolic panel.   Orders: -     Basic metabolic panel -     CBC with Differential/Platelet -     Hepatic function panel -     TSH -     Lipid panel  Hyperglycemia Assessment & Plan: Low carb diet and exercise.  Follow met b and a1c.   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  Completed isoniazid.     Colon cancer screening Assessment & Plan: Colonoscopy July 2021 revealed small lipoma in the sigmoid colon and internal hemorrhoids.  Otherwise normal.   History of brain tumor Assessment & Plan: S/p excision and craniectomy.  Saw neurology. MRI stable.  Recommended f/u 5-10 years.  Last seen 03/2021.    Hyperparathyroidism, primary [facility]) Assessment & Plan: S/p parathyroidectomy.  Has been followed by followed by Dr Gerrit Friends.  Follow calcium.  Normal bone density 2019.  Plan f/u bone density.     Meningioma [facility]) Assessment & Plan: MRI 11/2020 - Stable appearance of small dural-based extra axial lesion in the right sphenoid wing, most consistent with a small meningioma. Recommended f/u MRI q 5-10 years.    Right hemiparesis (HCC) Assessment & Plan: Overall relatively stable.  Follow.    Stress Assessment & Plan: Increased stress.  Discussed.  Will notify me if feels needs any further intervention.  Follow.    Other orders -     hydroCHLOROthiazide; Take 1 capsule (12.5 mg total) by mouth daily.  Dispense: 90 capsule;  Refill: 1     Dale Fairfield, MD

## 2022-12-18 ENCOUNTER — Encounter: Payer: Self-pay | Admitting: Internal Medicine

## 2022-12-18 DIAGNOSIS — F439 Reaction to severe stress, unspecified: Secondary | ICD-10-CM | POA: Insufficient documentation

## 2022-12-18 NOTE — Assessment & Plan Note (Signed)
Overall relatively stable.  Follow.   

## 2022-12-18 NOTE — Assessment & Plan Note (Signed)
Increased stress.  Discussed.  Will notify me if feels needs any further intervention.  Follow.  °

## 2022-12-18 NOTE — Assessment & Plan Note (Signed)
Colonoscopy July 2021 revealed small lipoma in the sigmoid colon and internal hemorrhoids.  Otherwise normal. 

## 2022-12-18 NOTE — Assessment & Plan Note (Signed)
Saw ID.  Completed isoniazid.

## 2022-12-18 NOTE — Assessment & Plan Note (Signed)
Low carb diet and exercise.  Follow met b and a1c.   

## 2022-12-18 NOTE — Assessment & Plan Note (Signed)
MRI 11/2020 - Stable appearance of small dural-based extra axial lesion in the right sphenoid wing, most consistent with a small meningioma. Recommended f/u MRI q 5-10 years. ? ?

## 2022-12-18 NOTE — Assessment & Plan Note (Signed)
S/p excision and craniectomy.  Saw neurology. MRI stable.  Recommended f/u 5-10 years.  Last seen 03/2021.  ?

## 2022-12-18 NOTE — Assessment & Plan Note (Signed)
Blood pressure as outlined.  Restart hctz.  Follow pressures.  Follow metabolic panel.

## 2022-12-18 NOTE — Assessment & Plan Note (Signed)
S/p parathyroidectomy.  Has been followed by followed by Dr Gerkin.  Follow calcium.  Normal bone density 2019.  Plan f/u bone density.   ?

## 2022-12-19 ENCOUNTER — Telehealth: Payer: Self-pay

## 2022-12-19 NOTE — Telephone Encounter (Signed)
-----   Message from Dale Gordon, MD sent at 12/18/2022  5:56 AM EDT ----- Notify - cholesterol is relatively stable overall.  (Increased some from last check, but overall relatively stable from checks prior).  Given calculated cholesterol risk, it is recommended for her to start a cholesterol medication.  If agreeable, I would like to start crestor 10mg  q day.  Check liver panel 6 weeks after starting. Overall sugar control stable.  Hgb, thyroid test and liver function tests are wnl.

## 2022-12-24 ENCOUNTER — Other Ambulatory Visit: Payer: Self-pay

## 2022-12-24 DIAGNOSIS — E7800 Pure hypercholesterolemia, unspecified: Secondary | ICD-10-CM

## 2022-12-24 MED ORDER — ROSUVASTATIN CALCIUM 10 MG PO TABS: 10.0000 mg | ORAL_TABLET | Freq: Every day | ORAL | 0 refills | Status: DC

## 2023-02-06 ENCOUNTER — Other Ambulatory Visit: Payer: Medicare Other

## 2023-02-07 ENCOUNTER — Other Ambulatory Visit (INDEPENDENT_AMBULATORY_CARE_PROVIDER_SITE_OTHER): Payer: Medicare Other

## 2023-02-07 DIAGNOSIS — E7800 Pure hypercholesterolemia, unspecified: Secondary | ICD-10-CM | POA: Diagnosis not present

## 2023-02-08 LAB — HEPATIC FUNCTION PANEL
ALT: 20 U/L (ref 0–35)
AST: 17 U/L (ref 0–37)
Albumin: 4.1 g/dL (ref 3.5–5.2)
Alkaline Phosphatase: 73 U/L (ref 39–117)
Bilirubin, Direct: 0.1 mg/dL (ref 0.0–0.3)
Total Bilirubin: 0.5 mg/dL (ref 0.2–1.2)
Total Protein: 6.8 g/dL (ref 6.0–8.3)

## 2023-04-03 ENCOUNTER — Other Ambulatory Visit: Payer: Self-pay | Admitting: Internal Medicine

## 2023-04-12 ENCOUNTER — Telehealth: Payer: Self-pay | Admitting: Internal Medicine

## 2023-04-12 DIAGNOSIS — E7800 Pure hypercholesterolemia, unspecified: Secondary | ICD-10-CM

## 2023-04-12 DIAGNOSIS — R739 Hyperglycemia, unspecified: Secondary | ICD-10-CM

## 2023-04-12 NOTE — Telephone Encounter (Signed)
Patient need lab orders.

## 2023-04-16 ENCOUNTER — Other Ambulatory Visit (INDEPENDENT_AMBULATORY_CARE_PROVIDER_SITE_OTHER): Payer: Medicare Other

## 2023-04-16 DIAGNOSIS — R739 Hyperglycemia, unspecified: Secondary | ICD-10-CM

## 2023-04-16 DIAGNOSIS — E7800 Pure hypercholesterolemia, unspecified: Secondary | ICD-10-CM

## 2023-04-16 LAB — BASIC METABOLIC PANEL
BUN: 12 mg/dL (ref 6–23)
CO2: 31 meq/L (ref 19–32)
Calcium: 10.5 mg/dL (ref 8.4–10.5)
Chloride: 107 meq/L (ref 96–112)
Creatinine, Ser: 0.87 mg/dL (ref 0.40–1.20)
GFR: 71.38 mL/min (ref >60.00)
Glucose, Bld: 99 mg/dL (ref 70–99)
Potassium: 4.2 meq/L (ref 3.5–5.1)
Sodium: 143 meq/L (ref 135–145)

## 2023-04-16 LAB — HEPATIC FUNCTION PANEL
ALT: 17 U/L (ref 0–35)
AST: 13 U/L (ref 0–37)
Albumin: 4.1 g/dL (ref 3.5–5.2)
Alkaline Phosphatase: 70 U/L (ref 39–117)
Bilirubin, Direct: 0.1 mg/dL (ref 0.0–0.3)
Total Bilirubin: 0.5 mg/dL (ref 0.2–1.2)
Total Protein: 6.6 g/dL (ref 6.0–8.3)

## 2023-04-16 LAB — LIPID PANEL
Cholesterol: 187 mg/dL (ref 0–200)
HDL: 74.1 mg/dL (ref >39.00)
LDL Cholesterol: 104 mg/dL — ABNORMAL HIGH (ref 0–99)
NonHDL: 113.28
Total CHOL/HDL Ratio: 3
Triglycerides: 46 mg/dL (ref 0.0–149.0)
VLDL: 9.2 mg/dL (ref 0.0–40.0)

## 2023-04-16 LAB — HEMOGLOBIN A1C: Hgb A1c MFr Bld: 5.9 % (ref 4.6–6.5)

## 2023-04-19 ENCOUNTER — Encounter: Payer: Medicare Other | Admitting: Internal Medicine

## 2023-04-24 ENCOUNTER — Encounter: Payer: Self-pay | Admitting: Internal Medicine

## 2023-04-24 ENCOUNTER — Ambulatory Visit: Payer: Medicare Other | Admitting: Internal Medicine

## 2023-04-24 VITALS — BP 124/72 | HR 63 | Temp 98.0°F | Resp 16 | Ht 62.0 in | Wt 169.6 lb

## 2023-04-24 DIAGNOSIS — F439 Reaction to severe stress, unspecified: Secondary | ICD-10-CM | POA: Diagnosis not present

## 2023-04-24 DIAGNOSIS — E210 Primary hyperparathyroidism: Secondary | ICD-10-CM

## 2023-04-24 DIAGNOSIS — G8191 Hemiplegia, unspecified affecting right dominant side: Secondary | ICD-10-CM

## 2023-04-24 DIAGNOSIS — E7800 Pure hypercholesterolemia, unspecified: Secondary | ICD-10-CM | POA: Diagnosis not present

## 2023-04-24 DIAGNOSIS — D329 Benign neoplasm of meninges, unspecified: Secondary | ICD-10-CM

## 2023-04-24 DIAGNOSIS — Z23 Encounter for immunization: Secondary | ICD-10-CM | POA: Diagnosis not present

## 2023-04-24 DIAGNOSIS — R739 Hyperglycemia, unspecified: Secondary | ICD-10-CM

## 2023-04-24 DIAGNOSIS — Z0000 Encounter for general adult medical examination without abnormal findings: Secondary | ICD-10-CM

## 2023-04-24 DIAGNOSIS — I10 Essential (primary) hypertension: Secondary | ICD-10-CM

## 2023-04-24 MED ORDER — HYDROCHLOROTHIAZIDE 12.5 MG PO CAPS: 12.5000 mg | ORAL_CAPSULE | Freq: Every day | ORAL | 3 refills | Status: DC

## 2023-04-24 MED ORDER — ROSUVASTATIN CALCIUM 10 MG PO TABS: 10.0000 mg | ORAL_TABLET | Freq: Every day | ORAL | 3 refills | Status: DC

## 2023-04-24 NOTE — Assessment & Plan Note (Signed)
S/p parathyroidectomy.  Has been followed by followed by Dr Gerrit Friends.  Follow calcium.  Normal bone density 2019.  Plan f/u bone density.  D/w her at next visit.

## 2023-04-24 NOTE — Assessment & Plan Note (Signed)
MRI 11/2020 - Stable appearance of small dural-based extra axial lesion in the right sphenoid wing, most consistent with a small meningioma. Recommended f/u MRI q 5-10 years. ? ?

## 2023-04-24 NOTE — Assessment & Plan Note (Signed)
Will notify me if feels needs any further intervention.  Follow.

## 2023-04-24 NOTE — Assessment & Plan Note (Signed)
Overall relatively stable.  Follow.  ?

## 2023-04-24 NOTE — Progress Notes (Signed)
Subjective:    Patient ID: Rachael Zimmerman, female    DOB: 12-05-1960, 62 y.o.   MRN: [id number]  Patient here for  Chief Complaint  Patient presents with   Annual Exam    HPI Here for a physical exam. She is doing relatively well.  No chest pain or sob.  Some increased stress.  Does not feel needs anything more at this time.  No abdominal pain.  Bowels moving.  Son is getting married next year. Discussed labs. Discussed A1c and low carb diet.    Past Medical History:  Diagnosis Date   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HCC)    s/p craniectomy   Hypertension    Pre-diabetes    patient denies ; see lab result for a1c 03-06-17 is 6.0   Seizures (HCC)    AFTER BRAIN SURGERY; none recently    Weakness of right side of body    d/t brain tumor intervention    Past Surgical History:  Procedure Laterality Date   ABDOMINAL HYSTERECTOMY  2010   ovaries not removed   BRAIN SURGERY     tumor excision s/p craniectomy   BREAST BIOPSY Left 2010   benign   CATARACT EXTRACTION W/PHACO Left 11/07/2016   Procedure: CATARACT EXTRACTION PHACO AND INTRAOCULAR LENS PLACEMENT (IOC);  Surgeon: Sallee Lange, MD;  Location: [facility];  Service: Ophthalmology;  Laterality: Left;  [place] 00:53.9 AP% 16.7 CDE 17.02 Fluid Pack Lot # [id number] H   CATARACT EXTRACTION W/PHACO Right 07/23/2022   Procedure: CATARACT EXTRACTION PHACO AND INTRAOCULAR LENS PLACEMENT (IOC) RIGHT;  Surgeon: Nevada Crane, MD;  Location: [facility];  Service: Ophthalmology;  Laterality: Right;  2.88 0:17.6   PARATHYROIDECTOMY Right 03/14/2017   Procedure: RIGHT PARATHYROIDECTOMY;  Surgeon: Darnell Level, MD;  Location: [facility];  Service: General;  Laterality: Right;   Family History  Problem Relation Age of Onset   Cancer Mother        ovary   Breast cancer Neg Hx    Colon cancer Neg Hx    Social History   Socioeconomic History   Marital status: Legally Separated    Spouse name: Not on file   Number of children: 1   Years  of education: Not on file   Highest education level: Not on file  Occupational History    Employer: [employer]  Tobacco Use   Smoking status: Never   Smokeless tobacco: Never  Vaping Use   Vaping status: Never Used  Substance and Sexual Activity   Alcohol use: Yes    Alcohol/week: 0.0 standard drinks of alcohol    Comment: seldom    Drug use: No   Sexual activity: Not on file  Other Topics Concern   Not on file  Social History Narrative   Not on file   Social Determinants of Health   Financial Resource Strain: Low Risk  (10/12/2021)   Overall Financial Resource Strain (CARDIA)    Difficulty of Paying Living Expenses: Not hard at all  Food Insecurity: No Food Insecurity (10/12/2021)   Hunger Vital Sign    Worried About Running Out of Food in the Last Year: Never true    Ran Out of Food in the Last Year: Never true  Transportation Needs: No Transportation Needs (10/12/2021)   PRAPARE - Administrator, Civil Service (Medical): No    Lack of Transportation (Non-Medical): No  Physical Activity: Not on file  Stress: No Stress Concern Present (10/12/2021)  [employer] of Occupational Health - Occupational Stress Questionnaire    Feeling of Stress : Not at all  Social Connections: Unknown (10/12/2021)   Social Connection and Isolation Panel [NHANES]    Frequency of Communication with Friends and Family: More than three times a week    Frequency of Social Gatherings with Friends and Family: More than three times a week    Attends Religious Services: Not on Marketing executive or Organizations: Not on file    Attends Banker Meetings: Not on file    Marital Status: Not on file     Review of Systems  Constitutional:  Negative for fatigue and unexpected weight change.  HENT:  Negative for congestion, sinus pressure and sore throat.   Eyes:  Negative for pain and visual disturbance.  Respiratory:  Negative for cough, chest  tightness and shortness of breath.   Cardiovascular:  Negative for chest pain, palpitations and leg swelling.  Gastrointestinal:  Negative for abdominal pain, diarrhea, nausea and vomiting.  Genitourinary:  Negative for difficulty urinating and dysuria.  Musculoskeletal:  Negative for joint swelling and myalgias.  Skin:  Negative for color change and rash.  Neurological:  Negative for dizziness and headaches.  Hematological:  Negative for adenopathy. Does not bruise/bleed easily.  Psychiatric/Behavioral:  Negative for agitation and dysphoric mood.        Objective:     BP 124/72   Pulse 63   Temp 98 F (36.7 C)   Resp 16   Ht 5\' 2"  (1.575 m)   Wt 169 lb 9.6 oz (76.9 kg)   SpO2 99%   BMI 31.02 kg/m  Wt Readings from Last 3 Encounters:  04/24/23 169 lb 9.6 oz (76.9 kg)  12/17/22 167 lb 9.6 oz (76 kg)  07/23/22 172 lb (78 kg)    Physical Exam Vitals reviewed.  Constitutional:      General: She is not in acute distress.    Appearance: Normal appearance.  HENT:     Head: Normocephalic and atraumatic.     Right Ear: External ear normal.     Left Ear: External ear normal.  Eyes:     General: No scleral icterus.       Right eye: No discharge.        Left eye: No discharge.     Conjunctiva/sclera: Conjunctivae normal.  Neck:     Thyroid: No thyromegaly.  Cardiovascular:     Rate and Rhythm: Normal rate and regular rhythm.  Pulmonary:     Effort: No respiratory distress.     Breath sounds: Normal breath sounds. No wheezing.  Abdominal:     General: Bowel sounds are normal.     Palpations: Abdomen is soft.     Tenderness: There is no abdominal tenderness.  Musculoskeletal:        General: No swelling or tenderness.     Cervical back: Neck supple. No tenderness.  Lymphadenopathy:     Cervical: No cervical adenopathy.  Skin:    Findings: No erythema or rash.  Neurological:     Mental Status: She is alert.  Psychiatric:        Mood and Affect: Mood normal.         Behavior: Behavior normal.      Outpatient Encounter Medications as of 04/24/2023  Medication Sig   Acetaminophen (TYLENOL PO) Take by mouth as needed.   Cholecalciferol 25 MCG (1000 UT) capsule Take 1,000 Units by mouth daily.  hydrochlorothiazide (MICROZIDE) 12.5 MG capsule Take 1 capsule (12.5 mg total) by mouth daily.   rosuvastatin (CRESTOR) 10 MG tablet Take 1 tablet (10 mg total) by mouth daily.   vitamin B-12 (CYANOCOBALAMIN) 1000 MCG tablet Take 1,000 mcg by mouth daily.   [DISCONTINUED] hydrochlorothiazide (MICROZIDE) 12.5 MG capsule Take 1 capsule (12.5 mg total) by mouth daily.   [DISCONTINUED] rosuvastatin (CRESTOR) 10 MG tablet TAKE 1 TABLET BY MOUTH EVERY DAY   No facility-administered encounter medications on file as of 04/24/2023.     Lab Results  Component Value Date   WBC 8.0 12/17/2022   HGB 12.9 12/17/2022   HCT 39.2 12/17/2022   PLT 166.0 12/17/2022   GLUCOSE 99 04/16/2023   CHOL 187 04/16/2023   TRIG 46.0 04/16/2023   HDL 74.10 04/16/2023   LDLCALC 104 (H) 04/16/2023   ALT 17 04/16/2023   AST 13 04/16/2023   NA 143 04/16/2023   K 4.2 04/16/2023   CL 107 04/16/2023   CREATININE 0.87 04/16/2023   BUN 12 04/16/2023   CO2 31 04/16/2023   TSH 1.75 12/17/2022   HGBA1C 5.9 04/16/2023    MM 3D SCREENING MAMMOGRAM BILATERAL BREAST  Result Date: 09/28/2022 CLINICAL DATA:  Screening. EXAM: DIGITAL SCREENING BILATERAL MAMMOGRAM WITH TOMOSYNTHESIS AND CAD TECHNIQUE: Bilateral screening digital craniocaudal and mediolateral oblique mammograms were obtained. Bilateral screening digital breast tomosynthesis was performed. The images were evaluated with computer-aided detection. COMPARISON:  Previous exam(s). ACR Breast Density Category b: There are scattered areas of fibroglandular density. FINDINGS: There are no findings suspicious for malignancy. IMPRESSION: No mammographic evidence of malignancy. A result letter of this screening mammogram will be mailed directly  to the patient. RECOMMENDATION: Screening mammogram in one year. (Code:SM-B-01Y) BI-RADS CATEGORY  1: Negative. Electronically Signed   By: Sherian Rein M.D.   On: 09/28/2022 11:47       Assessment & Plan:  Routine general medical examination at a health care facility  Health care maintenance Assessment & Plan: Physical today 04/24/23.  Saw gyn 08/2022  he does her breast, pelvic and pap smears. Up to date. Mammogram 09/28/22 - Birads I.  PAP 08/2022 - negative. Colonoscopy 12/31/19 - small lipoma in sigmoid colon, internal hemorrhoids otherwise normal. Repeat colonoscopy in 10 years [facility]).    Hypercholesteremia Assessment & Plan: The 10-year ASCVD risk score (Arnett DK, et al., 2019) is: 5.8%   Values used to calculate the score:     Age: 51 years     Sex: Female     Is Non-Hispanic African American: Yes     Diabetic: No     Tobacco smoker: No     Systolic Blood Pressure: 124 mmHg     Is BP treated: Yes     HDL Cholesterol: 74.1 mg/dL     Total Cholesterol: 187 mg/dL  Continues on crestor.  Follow lipid panel and liver function tests.   Orders: -     Lipid panel; Future -     Hepatic function panel; Future -     Hemoglobin A1c; Future -     Basic metabolic panel; Future  Need for influenza vaccination -     Flu vaccine trivalent PF, 6mos and older(Flulaval,Afluria,Fluarix,Fluzone)  Stress Assessment & Plan: Will notify me if feels needs any further intervention.  Follow.    Right hemiparesis (HCC) Assessment & Plan: Overall relatively stable.  Follow.    Meningioma [facility]) Assessment & Plan: MRI 11/2020 - Stable appearance of small dural-based extra axial lesion in  the right sphenoid wing, most consistent with a small meningioma. Recommended f/u MRI q 5-10 years.    Hyperparathyroidism, primary [facility]) Assessment & Plan: S/p parathyroidectomy.  Has been followed by followed by Dr Gerrit Friends.  Follow calcium.  Normal bone density 2019.  Plan f/u bone density.  D/w  her at next visit.    Hyperglycemia Assessment & Plan: Low carb diet and exercise.  Follow met b and a1c.    Essential hypertension, benign Assessment & Plan: Blood pressure as outlined.  On hctz.  Follow pressures.  Follow metabolic panel.    Other orders -     hydroCHLOROthiazide; Take 1 capsule (12.5 mg total) by mouth daily.  Dispense: 90 capsule; Refill: 3 -     Rosuvastatin Calcium; Take 1 tablet (10 mg total) by mouth daily.  Dispense: 90 tablet; Refill: 3     Dale Kingsley, MD

## 2023-04-24 NOTE — Assessment & Plan Note (Addendum)
Blood pressure as outlined.  On hctz.  Follow pressures.  Follow metabolic panel.   

## 2023-04-24 NOTE — Assessment & Plan Note (Signed)
Low carb diet and exercise.  Follow met b and a1c.   

## 2023-04-24 NOTE — Assessment & Plan Note (Addendum)
The 10-year ASCVD risk score (Arnett DK, et al., 2019) is: 5.8%   Values used to calculate the score:     Age: 62 years     Sex: Female     Is Non-Hispanic African American: Yes     Diabetic: No     Tobacco smoker: No     Systolic Blood Pressure: 124 mmHg     Is BP treated: Yes     HDL Cholesterol: 74.1 mg/dL     Total Cholesterol: 187 mg/dL  Continues on crestor.  Follow lipid panel and liver function tests.

## 2023-04-24 NOTE — Assessment & Plan Note (Addendum)
Physical today 04/24/23.  Saw gyn 08/2022  he does her breast, pelvic and pap smears. Up to date. Mammogram 09/28/22 - Birads I.  PAP 08/2022 - negative. Colonoscopy 12/31/19 - small lipoma in sigmoid colon, internal hemorrhoids otherwise normal. Repeat colonoscopy in 10 years [facility] Sine [facility]).

## 2023-07-03 ENCOUNTER — Telehealth: Payer: Self-pay | Admitting: Internal Medicine

## 2023-07-03 NOTE — Telephone Encounter (Unsigned)
Copied from CRM [id number]. Topic: Medicare AWV >> Jul 03, 2023 11:21 AM Payton Doughty wrote: Reason for CRM: Called 07/03/2023 to sched AWV - NO VOICEMAIL  Verlee Rossetti; Care Guide Ambulatory Clinical Support [place] [facility] Direct Dial: [phone]

## 2023-08-09 ENCOUNTER — Telehealth: Payer: Self-pay | Admitting: Internal Medicine

## 2023-08-09 NOTE — Telephone Encounter (Signed)
 Copied from CRM [id number]. Topic: Medicare AWV >> Aug 09, 2023 11:19 AM Payton Doughty wrote: Reason for CRM: Called 08/08/2023 to sched AWV - NO VOICEMAIL  Verlee Rossetti; Care Guide Ambulatory Clinical Support [place] [facility] Direct Dial: [phone]

## 2023-08-20 ENCOUNTER — Other Ambulatory Visit (INDEPENDENT_AMBULATORY_CARE_PROVIDER_SITE_OTHER): Payer: Medicare Other

## 2023-08-20 DIAGNOSIS — E7800 Pure hypercholesterolemia, unspecified: Secondary | ICD-10-CM | POA: Diagnosis not present

## 2023-08-20 LAB — BASIC METABOLIC PANEL
BUN: 15 mg/dL (ref 6–23)
CO2: 29 meq/L (ref 19–32)
Calcium: 10.2 mg/dL (ref 8.4–10.5)
Chloride: 106 meq/L (ref 96–112)
Creatinine, Ser: 0.86 mg/dL (ref 0.40–1.20)
GFR: 72.2 mL/min (ref >60.00)
Glucose, Bld: 99 mg/dL (ref 70–99)
Potassium: 3.8 meq/L (ref 3.5–5.1)
Sodium: 141 meq/L (ref 135–145)

## 2023-08-20 LAB — LIPID PANEL
Cholesterol: 132 mg/dL (ref 0–200)
HDL: 66.6 mg/dL (ref >39.00)
LDL Cholesterol: 57 mg/dL (ref 0–99)
NonHDL: 65.89
Total CHOL/HDL Ratio: 2
Triglycerides: 44 mg/dL (ref 0.0–149.0)
VLDL: 8.8 mg/dL (ref 0.0–40.0)

## 2023-08-20 LAB — HEPATIC FUNCTION PANEL
ALT: 18 U/L (ref 0–35)
AST: 16 U/L (ref 0–37)
Albumin: 4.3 g/dL (ref 3.5–5.2)
Alkaline Phosphatase: 75 U/L (ref 39–117)
Bilirubin, Direct: 0.1 mg/dL (ref 0.0–0.3)
Total Bilirubin: 0.4 mg/dL (ref 0.2–1.2)
Total Protein: 6.7 g/dL (ref 6.0–8.3)

## 2023-08-20 LAB — HEMOGLOBIN A1C: Hgb A1c MFr Bld: 6.2 % (ref 4.6–6.5)

## 2023-08-22 ENCOUNTER — Ambulatory Visit (INDEPENDENT_AMBULATORY_CARE_PROVIDER_SITE_OTHER): Payer: Medicare Other | Admitting: Internal Medicine

## 2023-08-22 ENCOUNTER — Encounter: Payer: Self-pay | Admitting: Internal Medicine

## 2023-08-22 VITALS — BP 122/84 | HR 79 | Temp 98.6°F | Ht 62.0 in | Wt 175.0 lb

## 2023-08-22 DIAGNOSIS — F439 Reaction to severe stress, unspecified: Secondary | ICD-10-CM

## 2023-08-22 DIAGNOSIS — D329 Benign neoplasm of meninges, unspecified: Secondary | ICD-10-CM | POA: Diagnosis not present

## 2023-08-22 DIAGNOSIS — G8191 Hemiplegia, unspecified affecting right dominant side: Secondary | ICD-10-CM

## 2023-08-22 DIAGNOSIS — Z124 Encounter for screening for malignant neoplasm of cervix: Secondary | ICD-10-CM

## 2023-08-22 DIAGNOSIS — E210 Primary hyperparathyroidism: Secondary | ICD-10-CM | POA: Diagnosis not present

## 2023-08-22 DIAGNOSIS — R739 Hyperglycemia, unspecified: Secondary | ICD-10-CM

## 2023-08-22 DIAGNOSIS — E7800 Pure hypercholesterolemia, unspecified: Secondary | ICD-10-CM

## 2023-08-22 DIAGNOSIS — I10 Essential (primary) hypertension: Secondary | ICD-10-CM | POA: Diagnosis not present

## 2023-08-22 NOTE — Progress Notes (Signed)
 Subjective:    Patient ID: Rachael Zimmerman, female    DOB: Aug 14, 1960, 63 y.o.   MRN: [id number]  Patient here for  Chief Complaint  Patient presents with   Hypertension   Stress   Hyperlipidemia    HPI Here for a scheduled follow up - follow up regarding hypercholesterolemia. She reports she is doing relatively well. No chest pain or sob reported. No abdominal pain or bowel change reported.    Past Medical History:  Diagnosis Date   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HCC)    s/p craniectomy   Hypertension    Pre-diabetes    patient denies ; see lab result for a1c 03-06-17 is 6.0   Seizures (HCC)    AFTER BRAIN SURGERY; none recently    Weakness of right side of body    d/t brain tumor intervention    Past Surgical History:  Procedure Laterality Date   ABDOMINAL HYSTERECTOMY  2010   ovaries not removed   BRAIN SURGERY     tumor excision s/p craniectomy   BREAST BIOPSY Left 2010   benign   CATARACT EXTRACTION W/PHACO Left 11/07/2016   Procedure: CATARACT EXTRACTION PHACO AND INTRAOCULAR LENS PLACEMENT (IOC);  Surgeon: Sallee Lange, MD;  Location: [facility];  Service: Ophthalmology;  Laterality: Left;  [place] 00:53.9 AP% 16.7 CDE 17.02 Fluid Pack Lot # [id number]   CATARACT EXTRACTION W/PHACO Right 07/23/2022   Procedure: CATARACT EXTRACTION PHACO AND INTRAOCULAR LENS PLACEMENT (IOC) RIGHT;  Surgeon: Nevada Crane, MD;  Location: [facility];  Service: Ophthalmology;  Laterality: Right;  2.88 0:17.6   PARATHYROIDECTOMY Right 03/14/2017   Procedure: RIGHT PARATHYROIDECTOMY;  Surgeon: Darnell Level, MD;  Location: [facility];  Service: General;  Laterality: Right;   Family History  Problem Relation Age of Onset   Cancer Mother        ovary   Breast cancer Neg Hx    Colon cancer Neg Hx    Social History   Socioeconomic History   Marital status: Legally Separated    Spouse name: Not on file   Number of children: 1   Years of education: Not on file   Highest education  level: Not on file  Occupational History    Employer: [employer]  Tobacco Use   Smoking status: Never   Smokeless tobacco: Never  Vaping Use   Vaping status: Never Used  Substance and Sexual Activity   Alcohol use: Yes    Alcohol/week: 0.0 standard drinks of alcohol    Comment: seldom    Drug use: No   Sexual activity: Not on file  Other Topics Concern   Not on file  Social History Narrative   Not on file   Social Drivers of Health   Financial Resource Strain: Low Risk  (10/12/2021)   Overall Financial Resource Strain (CARDIA)    Difficulty of Paying Living Expenses: Not hard at all  Food Insecurity: No Food Insecurity (10/12/2021)   Hunger Vital Sign    Worried About Running Out of Food in the Last Year: Never true    Ran Out of Food in the Last Year: Never true  Transportation Needs: No Transportation Needs (10/12/2021)   PRAPARE - Administrator, Civil Service (Medical): No    Lack of Transportation (Non-Medical): No  Physical Activity: Not on file  Stress: No Stress Concern Present (10/12/2021)   [employer] of Occupational Health - Occupational Stress Questionnaire    Feeling of Stress :  Not at all  Social Connections: Unknown (10/12/2021)   Social Connection and Isolation Panel [NHANES]    Frequency of Communication with Friends and Family: More than three times a week    Frequency of Social Gatherings with Friends and Family: More than three times a week    Attends Religious Services: Not on Marketing executive or Organizations: Not on file    Attends Banker Meetings: Not on file    Marital Status: Not on file     Review of Systems  Constitutional:  Negative for appetite change and unexpected weight change.  HENT:  Negative for congestion and sinus pressure.   Respiratory:  Negative for cough, chest tightness and shortness of breath.   Cardiovascular:  Negative for chest pain, palpitations and leg swelling.   Gastrointestinal:  Negative for abdominal pain, diarrhea, nausea and vomiting.  Genitourinary:  Negative for difficulty urinating and dysuria.  Musculoskeletal:  Negative for joint swelling and myalgias.  Skin:  Negative for color change and rash.  Neurological:  Negative for dizziness and headaches.  Psychiatric/Behavioral:  Negative for agitation and dysphoric mood.        Objective:     BP 122/84   Pulse 79   Temp 98.6 F (37 C) (Oral)   Ht 5\' 2"  (1.575 m)   Wt 175 lb (79.4 kg)   SpO2 99%   BMI 32.01 kg/m  Wt Readings from Last 3 Encounters:  08/22/23 175 lb (79.4 kg)  04/24/23 169 lb 9.6 oz (76.9 kg)  12/17/22 167 lb 9.6 oz (76 kg)    Physical Exam Vitals reviewed.  Constitutional:      General: She is not in acute distress.    Appearance: Normal appearance.  HENT:     Head: Normocephalic and atraumatic.     Right Ear: External ear normal.     Left Ear: External ear normal.     Mouth/Throat:     Pharynx: No oropharyngeal exudate or posterior oropharyngeal erythema.  Eyes:     General: No scleral icterus.       Right eye: No discharge.        Left eye: No discharge.     Conjunctiva/sclera: Conjunctivae normal.  Neck:     Thyroid: No thyromegaly.  Cardiovascular:     Rate and Rhythm: Normal rate and regular rhythm.  Pulmonary:     Effort: No respiratory distress.     Breath sounds: Normal breath sounds. No wheezing.  Abdominal:     General: Bowel sounds are normal.     Palpations: Abdomen is soft.     Tenderness: There is no abdominal tenderness.  Musculoskeletal:        General: No swelling or tenderness.     Cervical back: Neck supple. No tenderness.  Lymphadenopathy:     Cervical: No cervical adenopathy.  Skin:    Findings: No erythema or rash.  Neurological:     Mental Status: She is alert.  Psychiatric:        Mood and Affect: Mood normal.        Behavior: Behavior normal.         Outpatient Encounter Medications as of 08/22/2023   Medication Sig   Acetaminophen (TYLENOL PO) Take by mouth as needed.   Cholecalciferol 25 MCG (1000 UT) capsule Take 1,000 Units by mouth daily.   hydrochlorothiazide (MICROZIDE) 12.5 MG capsule Take 1 capsule (12.5 mg total) by mouth daily.   rosuvastatin (CRESTOR) 10  MG tablet Take 1 tablet (10 mg total) by mouth daily.   vitamin B-12 (CYANOCOBALAMIN) 1000 MCG tablet Take 1,000 mcg by mouth daily.   No facility-administered encounter medications on file as of 08/22/2023.     Lab Results  Component Value Date   WBC 8.0 12/17/2022   HGB 12.9 12/17/2022   HCT 39.2 12/17/2022   PLT 166.0 12/17/2022   GLUCOSE 99 08/20/2023   CHOL 132 08/20/2023   TRIG 44.0 08/20/2023   HDL 66.60 08/20/2023   LDLCALC 57 08/20/2023   ALT 18 08/20/2023   AST 16 08/20/2023   NA 141 08/20/2023   K 3.8 08/20/2023   CL 106 08/20/2023   CREATININE 0.86 08/20/2023   BUN 15 08/20/2023   CO2 29 08/20/2023   TSH 1.75 12/17/2022   HGBA1C 6.2 08/20/2023    MM 3D SCREENING MAMMOGRAM BILATERAL BREAST Result Date: 09/28/2022 CLINICAL DATA:  Screening. EXAM: DIGITAL SCREENING BILATERAL MAMMOGRAM WITH TOMOSYNTHESIS AND CAD TECHNIQUE: Bilateral screening digital craniocaudal and mediolateral oblique mammograms were obtained. Bilateral screening digital breast tomosynthesis was performed. The images were evaluated with computer-aided detection. COMPARISON:  Previous exam(s). ACR Breast Density Category b: There are scattered areas of fibroglandular density. FINDINGS: There are no findings suspicious for malignancy. IMPRESSION: No mammographic evidence of malignancy. A result letter of this screening mammogram will be mailed directly to the patient. RECOMMENDATION: Screening mammogram in one year. (Code:SM-B-01Y) BI-RADS CATEGORY  1: Negative. Electronically Signed   By: Sherian Rein M.D.   On: 09/28/2022 11:47       Assessment & Plan:  Stress Assessment & Plan: Overall appears to be doing well. No further  intervention needed at this time. Follow    Cervical cancer screening  Hypercholesteremia Assessment & Plan: The 10-year ASCVD risk score (Arnett DK, et al., 2019) is: 4.3%   Values used to calculate the score:     Age: 53 years     Sex: Female     Is Non-Hispanic African American: Yes     Diabetic: No     Tobacco smoker: No     Systolic Blood Pressure: 122 mmHg     Is BP treated: Yes     HDL Cholesterol: 66.6 mg/dL     Total Cholesterol: 132 mg/dL  Continues on crestor.  Follow lipid panel and liver function tests.    Hyperparathyroidism, primary [facility]) Assessment & Plan: S/p parathyroidectomy.  Has been followed by followed by Dr Gerrit Friends.  Follow calcium.  Normal bone density 2019.  Plan f/u bone density.     Hyperglycemia Assessment & Plan: Low carb diet and exercise.  Follow met b and a1c.    Essential hypertension, benign Assessment & Plan: Blood pressure as outlined.  On hctz.  Follow pressures.  Follow metabolic panel.    Right hemiparesis (HCC) Assessment & Plan: Overall stable.  Follow.    Meningioma [facility]) Assessment & Plan: MRI 11/2020 - Stable appearance of small dural-based extra axial lesion in the right sphenoid wing, most consistent with a small meningioma. Recommended f/u MRI q 5-10 years.       Dale [place], MD

## 2023-08-28 ENCOUNTER — Other Ambulatory Visit: Payer: Self-pay | Admitting: Internal Medicine

## 2023-08-28 DIAGNOSIS — H26491 Other secondary cataract, right eye: Secondary | ICD-10-CM | POA: Diagnosis not present

## 2023-08-28 DIAGNOSIS — Z961 Presence of intraocular lens: Secondary | ICD-10-CM | POA: Diagnosis not present

## 2023-08-28 DIAGNOSIS — Z1231 Encounter for screening mammogram for malignant neoplasm of breast: Secondary | ICD-10-CM

## 2023-08-28 DIAGNOSIS — Z9889 Other specified postprocedural states: Secondary | ICD-10-CM | POA: Diagnosis not present

## 2023-09-01 ENCOUNTER — Encounter: Payer: Self-pay | Admitting: Internal Medicine

## 2023-09-01 NOTE — Assessment & Plan Note (Signed)
 Overall appears to be doing well. No further intervention needed at this time. Follow

## 2023-09-01 NOTE — Assessment & Plan Note (Signed)
 The 10-year ASCVD risk score (Arnett DK, et al., 2019) is: 4.3%   Values used to calculate the score:     Age: 63 years     Sex: Female     Is Non-Hispanic African American: Yes     Diabetic: No     Tobacco smoker: No     Systolic Blood Pressure: 122 mmHg     Is BP treated: Yes     HDL Cholesterol: 66.6 mg/dL     Total Cholesterol: 132 mg/dL  Continues on crestor.  Follow lipid panel and liver function tests.

## 2023-09-01 NOTE — Assessment & Plan Note (Signed)
S/p parathyroidectomy.  Has been followed by followed by Dr Gerkin.  Follow calcium.  Normal bone density 2019.  Plan f/u bone density.   ?

## 2023-09-01 NOTE — Assessment & Plan Note (Signed)
 Low carb diet and exercise.  Follow met b and a1c.

## 2023-09-01 NOTE — Assessment & Plan Note (Signed)
MRI 11/2020 - Stable appearance of small dural-based extra axial lesion in the right sphenoid wing, most consistent with a small meningioma. Recommended f/u MRI q 5-10 years. ? ?

## 2023-09-01 NOTE — Assessment & Plan Note (Signed)
Blood pressure as outlined.  On hctz.  Follow pressures.  Follow metabolic panel.   

## 2023-09-01 NOTE — Assessment & Plan Note (Signed)
Overall stable.  Follow.  

## 2023-10-01 ENCOUNTER — Ambulatory Visit
Admission: RE | Admit: 2023-10-01 | Discharge: 2023-10-01 | Disposition: A | Discharge status: Home / Self Care | Source: Ambulatory Visit | Attending: Internal Medicine | Admitting: Internal Medicine

## 2023-10-01 DIAGNOSIS — Z1231 Encounter for screening mammogram for malignant neoplasm of breast: Secondary | ICD-10-CM | POA: Insufficient documentation

## 2023-10-01 LAB — HM PAP SMEAR: HM Pap smear: NEGATIVE

## 2023-10-06 ENCOUNTER — Other Ambulatory Visit: Payer: Self-pay | Admitting: Internal Medicine

## 2023-10-06 DIAGNOSIS — R928 Other abnormal and inconclusive findings on diagnostic imaging of breast: Secondary | ICD-10-CM

## 2023-10-06 NOTE — Progress Notes (Signed)
Order placed for f/u left breast mammogram and ultrasound.   

## 2023-10-09 ENCOUNTER — Ambulatory Visit
Admission: RE | Admit: 2023-10-09 | Discharge: 2023-10-09 | Disposition: A | Discharge status: Home / Self Care | Source: Ambulatory Visit | Attending: Internal Medicine | Admitting: Internal Medicine

## 2023-10-09 DIAGNOSIS — R928 Other abnormal and inconclusive findings on diagnostic imaging of breast: Secondary | ICD-10-CM | POA: Diagnosis not present

## 2023-10-09 DIAGNOSIS — R92322 Mammographic fibroglandular density, left breast: Secondary | ICD-10-CM | POA: Diagnosis not present

## 2023-11-08 ENCOUNTER — Telehealth: Payer: Self-pay | Admitting: Internal Medicine

## 2023-11-08 NOTE — Telephone Encounter (Signed)
 lm and sent MyChart message. Dr Geralyn Knee will not be in the office on July 14th. Please call and reschedule your appointment.  [id number] please reschedule appointment

## 2023-11-11 ENCOUNTER — Telehealth: Payer: Self-pay

## 2023-11-11 NOTE — Telephone Encounter (Signed)
 Noted

## 2023-11-11 NOTE — Telephone Encounter (Signed)
 Copied from CRM [id number]. Topic: General - Other >> Nov 11, 2023  1:36 PM [place] E wrote: Reason for CRM: Patient called in to reschedule her 7/14 appt, rescheduled to Aug. 12th and patient questioning if her lab appt that is on 7/11 will have to get pushed closer to her new appt date with PCP. Callback number for patient is [phone].  I spoke with patient and rescheduled her lab visit to go along with her new date for her office visit with Dr. Dellar Fenton.  I offered patient an earlier visit with Dr. Geralyn Knee, but patient didn't have her calendar with her and said she will just keep it where it is in August.

## 2023-12-03 ENCOUNTER — Ambulatory Visit (INDEPENDENT_AMBULATORY_CARE_PROVIDER_SITE_OTHER): Admitting: *Deleted

## 2023-12-03 VITALS — Ht 62.0 in | Wt 175.0 lb

## 2023-12-03 DIAGNOSIS — Z0000 Encounter for general adult medical examination without abnormal findings: Secondary | ICD-10-CM

## 2023-12-03 NOTE — Progress Notes (Signed)
 Subjective:   Rachael Zimmerman is a 63 y.o. who presents for a Medicare Wellness preventive visit.  As a reminder, Annual Wellness Visits don't include a physical exam, and some assessments may be limited, especially if this visit is performed virtually. We may recommend an in-person follow-up visit with your provider if needed.  Visit Complete: Virtual I connected with  Rachael Zimmerman on 12/03/23 by a audio enabled telemedicine application and verified that I am speaking with the correct person using two identifiers.  Patient Location: Home  Provider Location: Home Office  I discussed the limitations of evaluation and management by telemedicine. The patient expressed understanding and agreed to proceed.  Vital Signs: Because this visit was a virtual/telehealth visit, some criteria may be missing or patient reported. Any vitals not documented were not able to be obtained and vitals that have been documented are patient reported.  VideoDeclined- This patient declined Librarian, academic. Therefore the visit was completed with audio only.  Persons Participating in Visit: Patient.  AWV Questionnaire: No: Patient Medicare AWV questionnaire was not completed prior to this visit.  Cardiac Risk Factors include: hypertension;dyslipidemia;obesity (BMI >30kg/m2)     Objective:    Today's Vitals   12/03/23 0931  Weight: 175 lb (79.4 kg)  Height: 5' 2 (1.575 m)  PainSc: 7    Body mass index is 32.01 kg/m.     12/03/2023    9:45 AM 07/23/2022   10:20 AM 12/31/2021    2:33 PM 10/12/2021    2:14 PM 10/05/2020    1:22 PM 02/10/2020   11:10 AM 01/12/2020    4:25 PM  Advanced Directives  Does Patient Have a Medical Advance Directive? No Yes No No No No No  Type of Furniture conservator/restorer;Living will       Does patient want to make changes to medical advance directive?  No - Patient declined       Copy of Healthcare Power of Attorney in Chart?   No - copy requested       Would patient like information on creating a medical advance directive? No - Patient declined   No - Patient declined No - Patient declined No - Patient declined No - Patient declined    Current Medications (verified) Outpatient Encounter Medications as of 12/03/2023  Medication Sig   Acetaminophen  (TYLENOL  PO) Take by mouth as needed.   Cholecalciferol 25 MCG (1000 UT) capsule Take 1,000 Units by mouth daily.   hydrochlorothiazide  (MICROZIDE ) 12.5 MG capsule Take 1 capsule (12.5 mg total) by mouth daily.   rosuvastatin  (CRESTOR ) 10 MG tablet Take 1 tablet (10 mg total) by mouth daily.   vitamin B-12 (CYANOCOBALAMIN) 1000 MCG tablet Take 1,000 mcg by mouth daily.   No facility-administered encounter medications on file as of 12/03/2023.    Allergies (verified) Penicillins   History: Past Medical History:  Diagnosis Date   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HCC)    s/p craniectomy   Hypertension    Pre-diabetes    patient denies ; see lab result for a1c 03-06-17 is 6.0   Seizures (HCC)    AFTER BRAIN SURGERY; none recently    Weakness of right side of body    d/t brain tumor intervention    Past Surgical History:  Procedure Laterality Date   ABDOMINAL HYSTERECTOMY  2010   ovaries not removed   BRAIN SURGERY     tumor excision s/p craniectomy   BREAST BIOPSY Left 2010  benign   CATARACT EXTRACTION W/PHACO Left 11/07/2016   Procedure: CATARACT EXTRACTION PHACO AND INTRAOCULAR LENS PLACEMENT (IOC);  Surgeon: Dingeldein, Steven, MD;  Location: [facility];  Service: Ophthalmology;  Laterality: Left;  US  00:53.9 AP% 16.7 CDE 17.02 Fluid Pack Lot # [id number]   CATARACT EXTRACTION W/PHACO Right 07/23/2022   Procedure: CATARACT EXTRACTION PHACO AND INTRAOCULAR LENS PLACEMENT (IOC) RIGHT;  Surgeon: Myrna Adine Anes, MD;  Location: [facility];  Service: Ophthalmology;  Laterality: Right;  2.88 0:17.6   PARATHYROIDECTOMY Right 03/14/2017   Procedure: RIGHT  PARATHYROIDECTOMY;  Surgeon: Eletha Boas, MD;  Location: [facility];  Service: General;  Laterality: Right;   Family History  Problem Relation Age of Onset   Breast cancer Mother 47   Cancer Mother        ovary   Colon cancer Neg Hx    Social History   Socioeconomic History   Marital status: Legally Separated    Spouse name: Not on file   Number of children: 1   Years of education: Not on file   Highest education level: Not on file  Occupational History    Employer: [employer]  Tobacco Use   Smoking status: Never   Smokeless tobacco: Never  Vaping Use   Vaping status: Never Used  Substance and Sexual Activity   Alcohol use: Yes    Alcohol/week: 0.0 standard drinks of alcohol    Comment: seldom    Drug use: No   Sexual activity: Not on file  Other Topics Concern   Not on file  Social History Narrative   married   Social Drivers of Health   Financial Resource Strain: Low Risk  (12/03/2023)   Overall Financial Resource Strain (CARDIA)    Difficulty of Paying Living Expenses: Not hard at all  Food Insecurity: No Food Insecurity (12/03/2023)   Hunger Vital Sign    Worried About Running Out of Food in the Last Year: Never true    Ran Out of Food in the Last Year: Never true  Transportation Needs: No Transportation Needs (12/03/2023)   PRAPARE - Administrator, Civil Service (Medical): No    Lack of Transportation (Non-Medical): No  Physical Activity: Insufficiently Active (12/03/2023)   Exercise Vital Sign    Days of Exercise per Week: 2 days    Minutes of Exercise per Session: 30 min  Stress: No Stress Concern Present (12/03/2023)   [employer] of Occupational Health - Occupational Stress Questionnaire    Feeling of Stress: Only a little  Social Connections: Socially Integrated (12/03/2023)   Social Connection and Isolation Panel    Frequency of Communication with Friends and Family: More than three times a week    Frequency of Social  Gatherings with Friends and Family: More than three times a week    Attends Religious Services: More than 4 times per year    Active Member of [employer] or Organizations: Yes    Attends Engineer, structural: More than 4 times per year    Marital Status: Married    Tobacco Counseling Counseling given: Not Answered    Clinical Intake:  Pre-visit preparation completed: Yes  Pain : 0-10 Pain Score: 7  Pain Type: Chronic pain Pain Location: Hand Pain Orientation: Right Pain Descriptors / Indicators: Throbbing Pain Onset: More than a month ago Pain Frequency: Intermittent     BMI - recorded: 32.01 Nutritional Status: BMI > 30  Obese Nutritional Risks: None Diabetes: No  Lab  Results  Component Value Date   HGBA1C 6.2 08/20/2023   HGBA1C 5.9 04/16/2023   HGBA1C 5.8 12/17/2022     How often do you need to have someone help you when you read instructions, pamphlets, or other written materials from your doctor or pharmacy?: 1 - Never  Interpreter Needed?: No  Information entered by :: R. Latifah Padin LPN   Activities of Daily Living     12/03/2023    9:34 AM  In your present state of health, do you have any difficulty performing the following activities:  Hearing? 0  Vision? 0  Comment readers  Difficulty concentrating or making decisions? 0  Walking or climbing stairs? 1  Dressing or bathing? 0  Doing errands, shopping? 0  Preparing Food and eating ? N  Using the Toilet? N  In the past six months, have you accidently leaked urine? N  Do you have problems with loss of bowel control? N  Managing your Medications? N  Managing your Finances? N  Housekeeping or managing your Housekeeping? N    Patient Care Team: Glendia Shad, MD as PCP - General (Internal Medicine)  I have updated your Care Teams any recent Medical Services you may have received from other providers in the past year.     Assessment:   This is a routine wellness examination for  Rachael Zimmerman.  Hearing/Vision screen Hearing Screening - Comments:: No issues Vision Screening - Comments:: readers   Goals Addressed             This Visit's Progress    Patient Stated       Wants to get into an exercise program        Depression Screen     12/03/2023    9:39 AM 08/22/2023    1:55 PM 12/17/2022   10:09 AM 10/18/2021    8:11 AM 10/12/2021    2:12 PM 04/27/2021    8:23 AM 01/10/2021    8:17 AM  PHQ 2/9 Scores  PHQ - 2 Score 0 0 0 0 0 0 0  PHQ- 9 Score 0 0         Fall Risk     12/03/2023    9:36 AM 08/22/2023    1:55 PM 10/18/2021    8:11 AM 10/12/2021    2:15 PM 01/10/2021    8:17 AM  Fall Risk   Falls in the past year? 1 0 0 0 0  Number falls in past yr: 0 0  0 0  Injury with Fall? 0 0   0  Risk for fall due to : History of fall(s);Impaired balance/gait No Fall Risks No Fall Risks    Risk for fall due to: Comment gate not closed and fell backwards      Follow up Falls evaluation completed;Falls prevention discussed Falls evaluation completed Falls evaluation completed  Falls evaluation completed  Falls evaluation completed      Data saved with a previous flowsheet row definition    MEDICARE RISK AT HOME:  Medicare Risk at Home Any stairs in or around the home?: No If so, are there any without handrails?: No Home free of loose throw rugs in walkways, pet beds, electrical cords, etc?: Yes Adequate lighting in your home to reduce risk of falls?: Yes Life alert?: No Use of a cane, walker or w/c?: No Grab bars in the bathroom?: Yes Shower chair or bench in shower?: No Elevated toilet seat or a handicapped toilet?: No  TIMED UP AND GO:  Was the test performed?  No  Cognitive Function: 6CIT completed        12/03/2023    9:45 AM 10/05/2019    1:52 PM  6CIT Screen  What Year? 0 points 0 points  What month? 0 points 0 points  What time? 0 points 0 points  Count back from 20 0 points   Months in reverse 0 points 0 points  Repeat phrase 0 points    Total Score 0 points     Immunizations Immunization History  Administered Date(s) Administered   Influenza, Seasonal, Injecte, Preservative Fre 04/24/2023   Influenza,inj,Quad PF,6+ Mos 03/25/2014, 05/01/2016, 03/06/2017, 05/07/2018, 03/28/2020, 04/27/2021   Influenza-Unspecified 03/06/2017   PFIZER(Purple Top)SARS-COV-2 Vaccination 08/20/2019, 09/15/2019   PPD Test 05/01/2021    Screening Tests Health Maintenance  Topic Date Due   HIV Screening  Never done   DTaP/Tdap/Td (1 - Tdap) Never done   Zoster Vaccines- Shingrix (1 of 2) Never done   Medicare Annual Wellness (AWV)  10/13/2022   COVID-19 Vaccine (3 - 2024-25 season) 02/10/2023   Cervical Cancer Screening (HPV/Pap Cotest)  08/27/2023   INFLUENZA VACCINE  01/10/2024   MAMMOGRAM  09/30/2025   Colonoscopy  12/30/2029   Hepatitis C Screening  Completed   Hepatitis B Vaccines  Aged Out   HPV VACCINES  Aged Out   Meningococcal B Vaccine  Aged Out    Health Maintenance  Health Maintenance Due  Topic Date Due   HIV Screening  Never done   DTaP/Tdap/Td (1 - Tdap) Never done   Zoster Vaccines- Shingrix (1 of 2) Never done   Medicare Annual Wellness (AWV)  10/13/2022   COVID-19 Vaccine (3 - 2024-25 season) 02/10/2023   Cervical Cancer Screening (HPV/Pap Cotest)  08/27/2023   Health Maintenance Items Addressed: Discussed the need to update shingles, covid  and tetanus (Tdap) vaccines. Patient has a GYN that she sees.  Additional Screening:  Vision Screening: Recommended annual ophthalmology exams for early detection of glaucoma and other disorders of the eye.Up to date [facility] Would you like a referral to an eye doctor? No    Dental Screening: Recommended annual dental exams for proper oral hygiene  Community Resource Referral / Chronic Care Management: CRR required this visit?  No   CCM required this visit?  No   Plan:    I have personally reviewed and noted the following in the patient's chart:    Medical and social history Use of alcohol, tobacco or illicit drugs  Current medications and supplements including opioid prescriptions. Patient is not currently taking opioid prescriptions. Functional ability and status Nutritional status Physical activity Advanced directives List of other physicians Hospitalizations, surgeries, and ER visits in previous 12 months Vitals Screenings to include cognitive, depression, and falls Referrals and appointments  In addition, I have reviewed and discussed with patient certain preventive protocols, quality metrics, and best practice recommendations. A written personalized care plan for preventive services as well as general preventive health recommendations were provided to patient.   Angeline Fredericks, LPN   [id number]/[id number]   After Visit Summary: (MyChart) Due to this being a telephonic visit, the after visit summary with patients personalized plan was offered to patient via MyChart   Notes: Nothing significant to report at this time.

## 2023-12-03 NOTE — Patient Instructions (Signed)
 Rachael Zimmerman , Thank you for taking time out of your busy schedule to complete your Annual Wellness Visit with me. I enjoyed our conversation and look forward to speaking with you again next year. I, as well as your care team,  appreciate your ongoing commitment to your health goals. Please review the following plan we discussed and let me know if I can assist you in the future. Your Game plan/ To Do List    Referrals: If you haven't heard from the office you've been referred to, please reach out to them at the phone provided.  Remember to update your shingles, tetanus (Tdap) and covid vaccine Follow up Visits: Next Medicare AWV with our clinical staff: 12/07/24 @ 3:40   Have you seen your provider in the last 6 months (3 months if uncontrolled diabetes)? Yes Next Office Visit with your provider: 01/21/24  Clinician Recommendations:  Aim for 30 minutes of exercise or brisk walking, 6-8 glasses of water, and 5 servings of fruits and vegetables each day.       This is a list of the screening recommended for you and due dates:  Health Maintenance  Topic Date Due   HIV Screening  Never done   DTaP/Tdap/Td vaccine (1 - Tdap) Never done   Zoster (Shingles) Vaccine (1 of 2) Never done   COVID-19 Vaccine (3 - 2024-25 season) 02/10/2023   Flu Shot  01/10/2024   Medicare Annual Wellness Visit  12/02/2024   Mammogram  09/30/2025   Pap with HPV screening  10/01/2026   Colon Cancer Screening  12/30/2029   Hepatitis C Screening  Completed   Hepatitis B Vaccine  Aged Out   HPV Vaccine  Aged Out   Meningitis B Vaccine  Aged Out    Advanced directives: (ACP Link)Information on Advanced Care Planning can be found at [employer] Advance Health Care Directives Advance Health Care Directives. [email or web address]  Advance Care Planning is important because it:  [x]  Makes sure you receive the medical care that is consistent with your values, goals, and preferences  [x]  It provides guidance to  your family and loved ones and reduces their decisional burden about whether or not they are making the right decisions based on your wishes.  Follow the link provided in your after visit summary or read over the paperwork we have mailed to you to help you started getting your Advance Directives in place. If you need assistance in completing these, please reach out to us  so that we can help you!

## 2023-12-10 ENCOUNTER — Ambulatory Visit (INDEPENDENT_AMBULATORY_CARE_PROVIDER_SITE_OTHER)

## 2023-12-10 ENCOUNTER — Encounter: Payer: Self-pay | Admitting: Nurse Practitioner

## 2023-12-10 ENCOUNTER — Ambulatory Visit: Admitting: Nurse Practitioner

## 2023-12-10 VITALS — BP 122/70 | HR 81 | Temp 97.9°F | Ht 62.0 in | Wt 172.8 lb

## 2023-12-10 DIAGNOSIS — M79641 Pain in right hand: Secondary | ICD-10-CM

## 2023-12-10 DIAGNOSIS — M19041 Primary osteoarthritis, right hand: Secondary | ICD-10-CM | POA: Diagnosis not present

## 2023-12-10 DIAGNOSIS — M1811 Unilateral primary osteoarthritis of first carpometacarpal joint, right hand: Secondary | ICD-10-CM | POA: Diagnosis not present

## 2023-12-10 DIAGNOSIS — M7989 Other specified soft tissue disorders: Secondary | ICD-10-CM | POA: Diagnosis not present

## 2023-12-10 MED ORDER — PREDNISONE 20 MG PO TABS: 20.0000 mg | ORAL_TABLET | Freq: Every day | ORAL | 0 refills | Status: AC

## 2023-12-10 NOTE — Patient Instructions (Signed)
 You came in today because of pain and swelling in your right hand, especially around your thumb. The pain has been ongoing for over a month and is sharp and throbbing at times.  YOUR PLAN:  RIGHT HAND PAIN: You have intermittent pain near your thumb and the palmar surface of your hand, which is likely due to tendonitis or trigger finger. -We will order an x-ray of your right hand to get a better look at what might be causing the pain. -We will also perform lab tests to check for inflammation markers. -You will take prednisone for 5 days to help reduce the inflammation and pain. -If your symptoms do not improve, we will refer you to a hand specialist for further evaluation.

## 2023-12-10 NOTE — Progress Notes (Signed)
 Established Patient Office Visit  Subjective:  Patient ID: Rachael Zimmerman, female    DOB: Nov 07, 1960  Age: 63 y.o. MRN: [id number]  CC:  Chief Complaint  Patient presents with   Acute Visit    Hand pain x 1 month  Discussed the use of a AI scribe software for clinical note transcription with the patient, who gave verbal consent to proceed.   HPI  Rachael Zimmerman is a 63 year old female who presents with right hand pain and swelling.  She experiences soreness in her right hand, primarily around the thumb, for over a month. The pain is sharp, throbbing, and sometimes radiates from the palmar surface up the thumb. It is intermittent, with episodes lasting about three days. Writing and holding objects exacerbate the pain. Mild swelling is present today, though it sometimes appears larger. There have been no recent falls or injuries. She has no arthritis or diabetes. Tylenol  taken occasionally at night does not provide significant relief. There is no numbness or tingling in the hand.  HPI   Past Medical History:  Diagnosis Date   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HCC)    s/p craniectomy   Hypertension    Pre-diabetes    patient denies ; see lab result for a1c 03-06-17 is 6.0   Seizures (HCC)    AFTER BRAIN SURGERY; none recently    Weakness of right side of body    d/t brain tumor intervention     Past Surgical History:  Procedure Laterality Date   ABDOMINAL HYSTERECTOMY  2010   ovaries not removed   BRAIN SURGERY     tumor excision s/p craniectomy   BREAST BIOPSY Left 2010   benign   CATARACT EXTRACTION W/PHACO Left 11/07/2016   Procedure: CATARACT EXTRACTION PHACO AND INTRAOCULAR LENS PLACEMENT (IOC);  Surgeon: Dingeldein, Steven, MD;  Location: [facility];  Service: Ophthalmology;  Laterality: Left;  US  00:53.9 AP% 16.7 CDE 17.02 Fluid Pack Lot # [id number]   CATARACT EXTRACTION W/PHACO Right 07/23/2022   Procedure: CATARACT EXTRACTION PHACO AND INTRAOCULAR LENS PLACEMENT (IOC) RIGHT;   Surgeon: Myrna Adine Anes, MD;  Location: [facility];  Service: Ophthalmology;  Laterality: Right;  2.88 0:17.6   PARATHYROIDECTOMY Right 03/14/2017   Procedure: RIGHT PARATHYROIDECTOMY;  Surgeon: Eletha Boas, MD;  Location: [facility];  Service: General;  Laterality: Right;    Family History  Problem Relation Age of Onset   Breast cancer Mother 34   Cancer Mother        ovary   Colon cancer Neg Hx     Social History   Socioeconomic History   Marital status: Legally Separated    Spouse name: Not on file   Number of children: 1   Years of education: Not on file   Highest education level: Not on file  Occupational History    Employer: [employer]  Tobacco Use   Smoking status: Never   Smokeless tobacco: Never  Vaping Use   Vaping status: Never Used  Substance and Sexual Activity   Alcohol use: Yes    Alcohol/week: 0.0 standard drinks of alcohol    Comment: seldom    Drug use: No   Sexual activity: Not on file  Other Topics Concern   Not on file  Social History Narrative   married   Social Drivers of Health   Financial Resource Strain: Low Risk  (12/03/2023)   Overall Financial Resource Strain (CARDIA)    Difficulty of Paying Living Expenses:  Not hard at all  Food Insecurity: No Food Insecurity (12/03/2023)   Hunger Vital Sign    Worried About Running Out of Food in the Last Year: Never true    Ran Out of Food in the Last Year: Never true  Transportation Needs: No Transportation Needs (12/03/2023)   PRAPARE - Administrator, Civil Service (Medical): No    Lack of Transportation (Non-Medical): No  Physical Activity: Insufficiently Active (12/03/2023)   Exercise Vital Sign    Days of Exercise per Week: 2 days    Minutes of Exercise per Session: 30 min  Stress: No Stress Concern Present (12/03/2023)   [employer] of Occupational Health - Occupational Stress Questionnaire    Feeling of Stress: Only a little  Social  Connections: Socially Integrated (12/03/2023)   Social Connection and Isolation Panel    Frequency of Communication with Friends and Family: More than three times a week    Frequency of Social Gatherings with Friends and Family: More than three times a week    Attends Religious Services: More than 4 times per year    Active Member of [employer] or Organizations: Yes    Attends Engineer, structural: More than 4 times per year    Marital Status: Married  Catering manager Violence: Not At Risk (12/03/2023)   Humiliation, Afraid, Rape, and Kick questionnaire    Fear of Current or Ex-Partner: No    Emotionally Abused: No    Physically Abused: No    Sexually Abused: No     Outpatient Medications Prior to Visit  Medication Sig Dispense Refill   Acetaminophen  (TYLENOL  PO) Take by mouth as needed.     Cholecalciferol 25 MCG (1000 UT) capsule Take 1,000 Units by mouth daily.     hydrochlorothiazide  (MICROZIDE ) 12.5 MG capsule Take 1 capsule (12.5 mg total) by mouth daily. 90 capsule 3   rosuvastatin  (CRESTOR ) 10 MG tablet Take 1 tablet (10 mg total) by mouth daily. 90 tablet 3   vitamin B-12 (CYANOCOBALAMIN) 1000 MCG tablet Take 1,000 mcg by mouth daily.     No facility-administered medications prior to visit.    Allergies  Allergen Reactions   Penicillins Hives, Itching and Swelling    Has patient had a PCN reaction causing immediate rash, facial/tongue/throat swelling, SOB or lightheadedness with hypotension: Yes Has patient had a PCN reaction causing severe rash involving mucus membranes or skin necrosis: No Has patient had a PCN reaction that required hospitalization: No Has patient had a PCN reaction occurring within the last 10 years: Yes If all of the above answers are NO, then may proceed with Cephalosporin use.     ROS Review of Systems Negative unless indicated in HPI.    Objective:    Physical Exam  BP 122/70   Pulse 81   Temp 97.9 F (36.6 C)   Ht 5' 2  (1.575 m)   Wt 172 lb 12.8 oz (78.4 kg)   SpO2 98%   BMI 31.61 kg/m  Wt Readings from Last 3 Encounters:  12/10/23 172 lb 12.8 oz (78.4 kg)  12/03/23 175 lb (79.4 kg)  08/22/23 175 lb (79.4 kg)     Health Maintenance  Topic Date Due   HIV Screening  Never done   DTaP/Tdap/Td (1 - Tdap) Never done   Zoster Vaccines- Shingrix (1 of 2) Never done   COVID-19 Vaccine (3 - 2024-25 season) 02/10/2023   INFLUENZA VACCINE  01/10/2024   Medicare Annual Wellness (AWV)  12/02/2024  MAMMOGRAM  09/30/2025   Cervical Cancer Screening (HPV/Pap Cotest)  10/01/2026   Colonoscopy  12/30/2029   Hepatitis C Screening  Completed   Hepatitis B Vaccines  Aged Out   HPV VACCINES  Aged Out   Meningococcal B Vaccine  Aged Out    There are no preventive care reminders to display for this patient.  Lab Results  Component Value Date   TSH 1.75 12/17/2022   Lab Results  Component Value Date   WBC 8.0 12/17/2022   HGB 12.9 12/17/2022   HCT 39.2 12/17/2022   MCV 87.2 12/17/2022   PLT 166.0 12/17/2022   Lab Results  Component Value Date   NA 141 08/20/2023   K 3.8 08/20/2023   CO2 29 08/20/2023   GLUCOSE 99 08/20/2023   BUN 15 08/20/2023   CREATININE 0.86 08/20/2023   BILITOT 0.4 08/20/2023   ALKPHOS 75 08/20/2023   AST 16 08/20/2023   ALT 18 08/20/2023   PROT 6.7 08/20/2023   ALBUMIN 4.3 08/20/2023   CALCIUM  10.2 08/20/2023   ANIONGAP 10 08/10/2017   GFR 72.20 08/20/2023   Lab Results  Component Value Date   CHOL 132 08/20/2023   Lab Results  Component Value Date   HDL 66.60 08/20/2023   Lab Results  Component Value Date   LDLCALC 57 08/20/2023   Lab Results  Component Value Date   TRIG 44.0 08/20/2023   Lab Results  Component Value Date   CHOLHDL 2 08/20/2023   Lab Results  Component Value Date   HGBA1C 6.2 08/20/2023      Assessment & Plan:  Right hand pain Assessment & Plan: Intermittent pain near thumb and palmar surface, likely tendonitis or trigger  finger. - Order x-ray of right hand. - Perform lab tests for inflammation markers. - Prescribe prednisone  for 5 days. - Refer to hand specialist if symptoms persist.  Orders: -     DG Hand Complete Right -     Sedimentation rate -     C-reactive protein -     Uric acid  Other orders -     predniSONE ; Take 1 tablet (20 mg total) by mouth daily with breakfast for 5 days.  Dispense: 5 tablet; Refill: 0    Follow-up: No follow-ups on file.   Jacen Carlini, NP

## 2023-12-11 LAB — C-REACTIVE PROTEIN: CRP: <1 mg/dL (ref 0.5–20.0)

## 2023-12-11 LAB — URIC ACID: Uric Acid, Serum: 7.6 mg/dL — ABNORMAL HIGH (ref 2.4–7.0)

## 2023-12-11 LAB — SEDIMENTATION RATE: Sed Rate: 6 mm/h (ref 0–30)

## 2023-12-14 ENCOUNTER — Ambulatory Visit: Payer: Self-pay | Admitting: Nurse Practitioner

## 2023-12-20 ENCOUNTER — Other Ambulatory Visit

## 2023-12-23 ENCOUNTER — Ambulatory Visit: Admitting: Internal Medicine

## 2023-12-29 DIAGNOSIS — M79641 Pain in right hand: Secondary | ICD-10-CM | POA: Insufficient documentation

## 2023-12-29 NOTE — Assessment & Plan Note (Signed)
 Intermittent pain near thumb and palmar surface, likely tendonitis or trigger finger. - Order x-ray of right hand. - Perform lab tests for inflammation markers. - Prescribe prednisone  for 5 days. - Refer to hand specialist if symptoms persist.

## 2023-12-30 ENCOUNTER — Telehealth: Payer: Self-pay

## 2023-12-30 NOTE — Telephone Encounter (Signed)
 Copied from CRM [id number]. Topic: Clinical - Lab/Test Results >> Dec 30, 2023  9:30 AM Mesmerise C wrote: Reason for CRM: Patient returning call about her xray results read verbatim results patient understood had no further questions

## 2023-12-30 NOTE — Telephone Encounter (Signed)
 Noted

## 2024-01-08 ENCOUNTER — Telehealth: Payer: Self-pay | Admitting: Internal Medicine

## 2024-01-08 DIAGNOSIS — I10 Essential (primary) hypertension: Secondary | ICD-10-CM

## 2024-01-08 DIAGNOSIS — E7800 Pure hypercholesterolemia, unspecified: Secondary | ICD-10-CM

## 2024-01-08 DIAGNOSIS — R739 Hyperglycemia, unspecified: Secondary | ICD-10-CM

## 2024-01-08 NOTE — Telephone Encounter (Signed)
 Lab orders needed

## 2024-01-08 NOTE — Telephone Encounter (Signed)
Orders placed for f/u labs.  

## 2024-01-17 ENCOUNTER — Other Ambulatory Visit (INDEPENDENT_AMBULATORY_CARE_PROVIDER_SITE_OTHER)

## 2024-01-17 DIAGNOSIS — E7800 Pure hypercholesterolemia, unspecified: Secondary | ICD-10-CM

## 2024-01-17 DIAGNOSIS — R739 Hyperglycemia, unspecified: Secondary | ICD-10-CM

## 2024-01-17 DIAGNOSIS — I10 Essential (primary) hypertension: Secondary | ICD-10-CM

## 2024-01-17 LAB — LIPID PANEL
Cholesterol: 108 mg/dL (ref 0–200)
HDL: 51.5 mg/dL (ref >39.00)
LDL Cholesterol: 44 mg/dL (ref 0–99)
NonHDL: 56.08
Total CHOL/HDL Ratio: 2
Triglycerides: 62 mg/dL (ref 0.0–149.0)
VLDL: 12.4 mg/dL (ref 0.0–40.0)

## 2024-01-17 LAB — CBC WITH DIFFERENTIAL/PLATELET
Basophils Absolute: 0 K/uL (ref 0.0–0.1)
Basophils Relative: 0.5 % (ref 0.0–3.0)
Eosinophils Absolute: 0.1 K/uL (ref 0.0–0.7)
Eosinophils Relative: 1.2 % (ref 0.0–5.0)
HCT: 40.2 % (ref 36.0–46.0)
Hemoglobin: 13.3 g/dL (ref 12.0–15.0)
Lymphocytes Relative: 39.7 % (ref 12.0–46.0)
Lymphs Abs: 3.3 K/uL (ref 0.7–4.0)
MCHC: 33.1 g/dL (ref 30.0–36.0)
MCV: 86.7 fl (ref 78.0–100.0)
Monocytes Absolute: 0.6 K/uL (ref 0.1–1.0)
Monocytes Relative: 7.3 % (ref 3.0–12.0)
Neutro Abs: 4.3 K/uL (ref 1.4–7.7)
Neutrophils Relative %: 51.3 % (ref 43.0–77.0)
Platelets: 142 K/uL — ABNORMAL LOW (ref 150.0–400.0)
RBC: 4.64 Mil/uL (ref 3.87–5.11)
RDW: 13.6 % (ref 11.5–15.5)
WBC: 8.3 K/uL (ref 4.0–10.5)

## 2024-01-17 LAB — HEPATIC FUNCTION PANEL
ALT: 20 U/L (ref 0–35)
AST: 14 U/L (ref 0–37)
Albumin: 4.1 g/dL (ref 3.5–5.2)
Alkaline Phosphatase: 77 U/L (ref 39–117)
Bilirubin, Direct: 0.1 mg/dL (ref 0.0–0.3)
Total Bilirubin: 0.5 mg/dL (ref 0.2–1.2)
Total Protein: 6.5 g/dL (ref 6.0–8.3)

## 2024-01-17 LAB — BASIC METABOLIC PANEL WITH GFR
BUN: 11 mg/dL (ref 6–23)
CO2: 30 meq/L (ref 19–32)
Calcium: 10.4 mg/dL (ref 8.4–10.5)
Chloride: 105 meq/L (ref 96–112)
Creatinine, Ser: 0.92 mg/dL (ref 0.40–1.20)
GFR: 66.4 mL/min (ref >60.00)
Glucose, Bld: 115 mg/dL — ABNORMAL HIGH (ref 70–99)
Potassium: 4.1 meq/L (ref 3.5–5.1)
Sodium: 141 meq/L (ref 135–145)

## 2024-01-17 LAB — TSH: TSH: 1.7 u[IU]/mL (ref 0.35–5.50)

## 2024-01-17 LAB — HEMOGLOBIN A1C: Hgb A1c MFr Bld: 6.2 % (ref 4.6–6.5)

## 2024-01-18 ENCOUNTER — Ambulatory Visit: Payer: Self-pay | Admitting: Internal Medicine

## 2024-01-21 ENCOUNTER — Ambulatory Visit (INDEPENDENT_AMBULATORY_CARE_PROVIDER_SITE_OTHER): Admitting: Internal Medicine

## 2024-01-21 ENCOUNTER — Encounter: Payer: Self-pay | Admitting: Internal Medicine

## 2024-01-21 VITALS — BP 120/60 | HR 108 | Temp 97.6°F | Ht 62.0 in | Wt 170.6 lb

## 2024-01-21 DIAGNOSIS — Z1211 Encounter for screening for malignant neoplasm of colon: Secondary | ICD-10-CM

## 2024-01-21 DIAGNOSIS — R739 Hyperglycemia, unspecified: Secondary | ICD-10-CM | POA: Diagnosis not present

## 2024-01-21 DIAGNOSIS — D696 Thrombocytopenia, unspecified: Secondary | ICD-10-CM | POA: Insufficient documentation

## 2024-01-21 DIAGNOSIS — I10 Essential (primary) hypertension: Secondary | ICD-10-CM

## 2024-01-21 DIAGNOSIS — E210 Primary hyperparathyroidism: Secondary | ICD-10-CM

## 2024-01-21 DIAGNOSIS — Z87898 Personal history of other specified conditions: Secondary | ICD-10-CM

## 2024-01-21 DIAGNOSIS — Z8669 Personal history of other diseases of the nervous system and sense organs: Secondary | ICD-10-CM | POA: Diagnosis not present

## 2024-01-21 DIAGNOSIS — G8191 Hemiplegia, unspecified affecting right dominant side: Secondary | ICD-10-CM | POA: Diagnosis not present

## 2024-01-21 DIAGNOSIS — D329 Benign neoplasm of meninges, unspecified: Secondary | ICD-10-CM | POA: Diagnosis not present

## 2024-01-21 DIAGNOSIS — E7800 Pure hypercholesterolemia, unspecified: Secondary | ICD-10-CM | POA: Diagnosis not present

## 2024-01-21 NOTE — Progress Notes (Signed)
 Subjective:    Patient ID: Rachael Zimmerman, female    DOB: [birth year], 63 y.o.   MRN: [id number]  Patient here for  Chief Complaint  Patient presents with   Medical Management of Chronic Issues    4 month f/u    HPI Here for a scheduled follow up - follow up regarding hypertension, hypercholesterolemia and hyperglycemia. Saw gyn 09/2023 - annual.  Pap performed. Appears to be doing well. No chest pain or sob reported. No abdominal pain or bowel change reported. Discussed recent labs. Slightly decreased platelet count. Not taking antiinflammatory medication.    Past Medical History:  Diagnosis Date   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HCC)    s/p craniectomy   Hypertension    Pre-diabetes    patient denies ; see lab result for a1c 03-06-17 is 6.0   Seizures (HCC)    AFTER BRAIN SURGERY; none recently    Weakness of right side of body    d/t brain tumor intervention    Past Surgical History:  Procedure Laterality Date   ABDOMINAL HYSTERECTOMY  2010   ovaries not removed   BRAIN SURGERY     tumor excision s/p craniectomy   BREAST BIOPSY Left 2010   benign   CATARACT EXTRACTION W/PHACO Left 11/07/2016   Procedure: CATARACT EXTRACTION PHACO AND INTRAOCULAR LENS PLACEMENT (IOC);  Surgeon: Dingeldein, Steven, MD;  Location: [facility];  Service: Ophthalmology;  Laterality: Left;  US  00:53.9 AP% 16.7 CDE 17.02 Fluid Pack Lot # [id number]   CATARACT EXTRACTION W/PHACO Right 07/23/2022   Procedure: CATARACT EXTRACTION PHACO AND INTRAOCULAR LENS PLACEMENT (IOC) RIGHT;  Surgeon: Myrna Adine Anes, MD;  Location: [facility];  Service: Ophthalmology;  Laterality: Right;  2.88 0:17.6   PARATHYROIDECTOMY Right 03/14/2017   Procedure: RIGHT PARATHYROIDECTOMY;  Surgeon: Eletha Boas, MD;  Location: [facility];  Service: General;  Laterality: Right;   Family History  Problem Relation Age of Onset   Breast cancer Mother 80   Cancer Mother        ovary   Colon cancer Neg Hx    Social History    Socioeconomic History   Marital status: Legally Separated    Spouse name: Not on file   Number of children: 1   Years of education: Not on file   Highest education level: Bachelor's degree (e.g., BA, AB, BS)  Occupational History    Employer: [employer]  Tobacco Use   Smoking status: Never   Smokeless tobacco: Never  Vaping Use   Vaping status: Never Used  Substance and Sexual Activity   Alcohol use: Yes    Alcohol/week: 0.0 standard drinks of alcohol    Comment: seldom    Drug use: No   Sexual activity: Not on file  Other Topics Concern   Not on file  Social History Narrative   married   Social Drivers of Health   Financial Resource Strain: Low Risk  (01/20/2024)   Overall Financial Resource Strain (CARDIA)    Difficulty of Paying Living Expenses: Not very hard  Food Insecurity: No Food Insecurity (01/20/2024)   Hunger Vital Sign    Worried About Running Out of Food in the Last Year: Never true    Ran Out of Food in the Last Year: Never true  Transportation Needs: No Transportation Needs (01/20/2024)   PRAPARE - Administrator, Civil Service (Medical): No    Lack of Transportation (Non-Medical): No  Physical Activity: Insufficiently Active (01/20/2024)  Exercise Vital Sign    Days of Exercise per Week: 2 days    Minutes of Exercise per Session: 20 min  Stress: No Stress Concern Present (01/20/2024)   [employer] of Occupational Health - Occupational Stress Questionnaire    Feeling of Stress: Not at all  Social Connections: Moderately Integrated (01/20/2024)   Social Connection and Isolation Panel    Frequency of Communication with Friends and Family: More than three times a week    Frequency of Social Gatherings with Friends and Family: More than three times a week    Attends Religious Services: More than 4 times per year    Active Member of [employer] or Organizations: No    Attends Engineer, structural: Not on file     Marital Status: Married     Review of Systems  Constitutional:  Negative for appetite change and unexpected weight change.  HENT:  Negative for congestion and sinus pressure.   Respiratory:  Negative for cough, chest tightness and shortness of breath.   Cardiovascular:  Negative for chest pain, palpitations and leg swelling.  Gastrointestinal:  Negative for abdominal pain, diarrhea, nausea and vomiting.  Genitourinary:  Negative for difficulty urinating and dysuria.  Musculoskeletal:  Negative for joint swelling and myalgias.  Skin:  Negative for color change and rash.  Neurological:  Negative for dizziness and headaches.  Psychiatric/Behavioral:  Negative for agitation and dysphoric mood.        Objective:     BP 120/60 (BP Location: Left Arm, Patient Position: Sitting, Cuff Size: Normal)   Pulse (!) 108   Temp 97.6 F (36.4 C) (Oral)   Ht 5' 2 (1.575 m)   Wt 170 lb 9.6 oz (77.4 kg)   SpO2 95%   BMI 31.20 kg/m  Wt Readings from Last 3 Encounters:  01/21/24 170 lb 9.6 oz (77.4 kg)  12/10/23 172 lb 12.8 oz (78.4 kg)  12/03/23 175 lb (79.4 kg)    Physical Exam Vitals reviewed.  Constitutional:      General: She is not in acute distress.    Appearance: Normal appearance.  HENT:     Head: Normocephalic and atraumatic.     Right Ear: External ear normal.     Left Ear: External ear normal.     Mouth/Throat:     Pharynx: No oropharyngeal exudate or posterior oropharyngeal erythema.  Eyes:     General: No scleral icterus.       Right eye: No discharge.        Left eye: No discharge.     Conjunctiva/sclera: Conjunctivae normal.  Neck:     Thyroid : No thyromegaly.  Cardiovascular:     Rate and Rhythm: Normal rate and regular rhythm.  Pulmonary:     Effort: No respiratory distress.     Breath sounds: Normal breath sounds. No wheezing.  Abdominal:     General: Bowel sounds are normal.     Palpations: Abdomen is soft.     Tenderness: There is no abdominal tenderness.   Musculoskeletal:        General: No swelling or tenderness.     Cervical back: Neck supple. No tenderness.  Lymphadenopathy:     Cervical: No cervical adenopathy.  Skin:    Findings: No erythema or rash.  Neurological:     Mental Status: She is alert.  Psychiatric:        Mood and Affect: Mood normal.        Behavior: Behavior normal.  Outpatient Encounter Medications as of 01/21/2024  Medication Sig   Cholecalciferol 25 MCG (1000 UT) capsule Take 1,000 Units by mouth daily.   hydrochlorothiazide  (MICROZIDE ) 12.5 MG capsule Take 1 capsule (12.5 mg total) by mouth daily.   rosuvastatin  (CRESTOR ) 10 MG tablet Take 1 tablet (10 mg total) by mouth daily.   vitamin B-12 (CYANOCOBALAMIN) 1000 MCG tablet Take 1,000 mcg by mouth daily.   Acetaminophen  (TYLENOL  PO) Take by mouth as needed. (Patient not taking: Reported on 01/21/2024)   No facility-administered encounter medications on file as of 01/21/2024.     Lab Results  Component Value Date   WBC 8.3 01/17/2024   HGB 13.3 01/17/2024   HCT 40.2 01/17/2024   PLT 142.0 (L) 01/17/2024   GLUCOSE 115 (H) 01/17/2024   CHOL 108 01/17/2024   TRIG 62.0 01/17/2024   HDL 51.50 01/17/2024   LDLCALC 44 01/17/2024   ALT 20 01/17/2024   AST 14 01/17/2024   NA 141 01/17/2024   K 4.1 01/17/2024   CL 105 01/17/2024   CREATININE 0.92 01/17/2024   BUN 11 01/17/2024   CO2 30 01/17/2024   TSH 1.70 01/17/2024   HGBA1C 6.2 01/17/2024    MM 3D DIAGNOSTIC MAMMOGRAM UNILATERAL LEFT BREAST Result Date: 10/09/2023 CLINICAL DATA:  LEFT asymmetry callback EXAM: DIGITAL DIAGNOSTIC UNILATERAL LEFT MAMMOGRAM WITH TOMOSYNTHESIS AND CAD TECHNIQUE: Left digital diagnostic mammography and breast tomosynthesis was performed. The images were evaluated with computer-aided detection. COMPARISON:  Previous exam(s). ACR Breast Density Category b: There are scattered areas of fibroglandular density. FINDINGS: The previously described finding does not persist  with additional views, consistent with superimposed fibroglandular tissue. Fibroglandular tissue assumes a configuration stable in comparison to more remote prior mammograms. No suspicious mass, microcalcification, or other finding is identified. IMPRESSION: No mammographic evidence of malignancy. RECOMMENDATION: Screening mammogram in one year.(Code:SM-B-01Y) I have discussed the findings and recommendations with the patient. If applicable, a reminder letter will be sent to the patient regarding the next appointment. BI-RADS CATEGORY  1: Negative. Electronically Signed   By: Corean Salter M.D.   On: 10/09/2023 09:51       Assessment & Plan:  Thrombocytopenia (HCC) Assessment & Plan: Recent lab - slightly decreased platelet count. Recheck platelet count in 3-4 weeks.   Orders: -     Platelet count; Future  Colon cancer screening Assessment & Plan: Colonoscopy July 2021 revealed small lipoma in the sigmoid colon and internal hemorrhoids.  Otherwise normal.   Essential hypertension, benign Assessment & Plan: Continue hydrochlorothiazide . Blood pressure as outlined. No changes today. Follow metabolic panel.    History of brain tumor Assessment & Plan: S/p excision and craniectomy.  Saw neurology. MRI stable.  Recommended f/u 5-10 years.  Last seen 2022.    History of seizure disorder Assessment & Plan: Has done well off keppra.    Hypercholesteremia Assessment & Plan:  LDL - recent labs 44. Continue crestor . Continue low cholesterol diet and exercise. Follow lipid panel.    Hyperglycemia Assessment & Plan: Low carb diet and exercise. Follow met b and A1c.   Lab Results  Component Value Date   HGBA1C 6.2 01/17/2024      Hyperparathyroidism, primary [facility]) Assessment & Plan: S/p parathyroidectomy.  Has been followed by followed by Dr Eletha.  Follow calcium .  Normal bone density 2019.  Needs f/u bone density.    Meningioma [facility]) Assessment & Plan: MRI 11/2020 -  Stable appearance of small dural-based extra axial lesion in the right sphenoid wing, most  consistent with a small meningioma. Recommended f/u MRI q 5-10 years.    Right hemiparesis (HCC) Assessment & Plan: Stable. Follow.       Allena Hamilton, MD

## 2024-01-26 ENCOUNTER — Encounter: Payer: Self-pay | Admitting: Internal Medicine

## 2024-01-26 NOTE — Assessment & Plan Note (Signed)
Stable.  Follow.   

## 2024-01-26 NOTE — Assessment & Plan Note (Signed)
 Has done well off keppra.

## 2024-01-26 NOTE — Assessment & Plan Note (Signed)
 LDL - recent labs 44. Continue crestor . Continue low cholesterol diet and exercise. Follow lipid panel.

## 2024-01-26 NOTE — Assessment & Plan Note (Signed)
Colonoscopy July 2021 revealed small lipoma in the sigmoid colon and internal hemorrhoids.  Otherwise normal. 

## 2024-01-26 NOTE — Assessment & Plan Note (Signed)
 S/p parathyroidectomy.  Has been followed by followed by Dr Eletha.  Follow calcium .  Normal bone density 2019.  Needs f/u bone density.

## 2024-01-26 NOTE — Assessment & Plan Note (Signed)
MRI 11/2020 - Stable appearance of small dural-based extra axial lesion in the right sphenoid wing, most consistent with a small meningioma. Recommended f/u MRI q 5-10 years. ? ?

## 2024-01-26 NOTE — Assessment & Plan Note (Signed)
 S/p excision and craniectomy.  Saw neurology. MRI stable.  Recommended f/u 5-10 years.  Last seen 2022.

## 2024-01-26 NOTE — Assessment & Plan Note (Signed)
 Recent lab - slightly decreased platelet count. Recheck platelet count in 3-4 weeks.

## 2024-01-26 NOTE — Assessment & Plan Note (Signed)
 Continue hydrochlorothiazide . Blood pressure as outlined. No changes today. Follow metabolic panel.

## 2024-01-26 NOTE — Assessment & Plan Note (Signed)
 Low carb diet and exercise. Follow met b and A1c.   Lab Results  Component Value Date   HGBA1C 6.2 01/17/2024

## 2024-02-11 ENCOUNTER — Other Ambulatory Visit (INDEPENDENT_AMBULATORY_CARE_PROVIDER_SITE_OTHER)

## 2024-02-11 DIAGNOSIS — D696 Thrombocytopenia, unspecified: Secondary | ICD-10-CM | POA: Diagnosis not present

## 2024-02-11 LAB — PLATELET COUNT: Platelets: 158 Thousand/uL (ref 140–400)

## 2024-02-12 ENCOUNTER — Ambulatory Visit: Payer: Self-pay | Admitting: Internal Medicine

## 2024-04-24 ENCOUNTER — Other Ambulatory Visit: Payer: Self-pay | Admitting: Internal Medicine

## 2024-05-20 ENCOUNTER — Other Ambulatory Visit: Payer: Self-pay | Admitting: *Deleted

## 2024-05-20 DIAGNOSIS — R739 Hyperglycemia, unspecified: Secondary | ICD-10-CM

## 2024-05-20 DIAGNOSIS — E7800 Pure hypercholesterolemia, unspecified: Secondary | ICD-10-CM

## 2024-05-20 DIAGNOSIS — I10 Essential (primary) hypertension: Secondary | ICD-10-CM

## 2024-05-26 ENCOUNTER — Telehealth: Payer: Self-pay

## 2024-05-26 ENCOUNTER — Other Ambulatory Visit

## 2024-05-26 NOTE — Telephone Encounter (Signed)
 Lvm to let pt know labs will be back in time, mychart message also sent

## 2024-05-26 NOTE — Telephone Encounter (Signed)
 Copied from CRM [id number]. Topic: Clinical - Lab/Test Results >> May 26, 2024 11:02 AM Rachael Zimmerman wrote: Reason for CRM: Patient called in regarding lab appointment patient want to know if she has a lab appointment on 12/17 will she  have her results back by 12/18

## 2024-05-27 ENCOUNTER — Other Ambulatory Visit

## 2024-05-27 DIAGNOSIS — I10 Essential (primary) hypertension: Secondary | ICD-10-CM

## 2024-05-27 DIAGNOSIS — E7800 Pure hypercholesterolemia, unspecified: Secondary | ICD-10-CM | POA: Diagnosis not present

## 2024-05-27 DIAGNOSIS — R739 Hyperglycemia, unspecified: Secondary | ICD-10-CM

## 2024-05-27 LAB — CBC WITH DIFFERENTIAL/PLATELET
Basophils Absolute: 0 K/uL (ref 0.0–0.1)
Basophils Relative: 0.3 % (ref 0.0–3.0)
Eosinophils Absolute: 0.1 K/uL (ref 0.0–0.7)
Eosinophils Relative: 0.7 % (ref 0.0–5.0)
HCT: 39.3 % (ref 36.0–46.0)
Hemoglobin: 13 g/dL (ref 12.0–15.0)
Lymphocytes Relative: 35.3 % (ref 12.0–46.0)
Lymphs Abs: 3.1 K/uL (ref 0.7–4.0)
MCHC: 33 g/dL (ref 30.0–36.0)
MCV: 86.7 fl (ref 78.0–100.0)
Monocytes Absolute: 0.4 K/uL (ref 0.1–1.0)
Monocytes Relative: 4.2 % (ref 3.0–12.0)
Neutro Abs: 5.3 K/uL (ref 1.4–7.7)
Neutrophils Relative %: 59.5 % (ref 43.0–77.0)
Platelets: 164 K/uL (ref 150.0–400.0)
RBC: 4.54 Mil/uL (ref 3.87–5.11)
RDW: 13.7 % (ref 11.5–15.5)
WBC: 8.9 K/uL (ref 4.0–10.5)

## 2024-05-27 LAB — HEPATIC FUNCTION PANEL
ALT: 22 U/L (ref 3–35)
AST: 15 U/L (ref 5–37)
Albumin: 4.2 g/dL (ref 3.5–5.2)
Alkaline Phosphatase: 67 U/L (ref 39–117)
Bilirubin, Direct: 0.1 mg/dL (ref 0.1–0.3)
Total Bilirubin: 0.5 mg/dL (ref 0.2–1.2)
Total Protein: 6.7 g/dL (ref 6.0–8.3)

## 2024-05-27 LAB — BASIC METABOLIC PANEL WITH GFR
BUN: 13 mg/dL (ref 6–23)
CO2: 33 meq/L — ABNORMAL HIGH (ref 19–32)
Calcium: 10.8 mg/dL — ABNORMAL HIGH (ref 8.4–10.5)
Chloride: 105 meq/L (ref 96–112)
Creatinine, Ser: 0.86 mg/dL (ref 0.40–1.20)
GFR: 71.81 mL/min (ref >60.00)
Glucose, Bld: 110 mg/dL — ABNORMAL HIGH (ref 70–99)
Potassium: 3.9 meq/L (ref 3.5–5.1)
Sodium: 143 meq/L (ref 135–145)

## 2024-05-27 LAB — LIPID PANEL
Cholesterol: 130 mg/dL (ref 28–200)
HDL: 52.4 mg/dL (ref >39.00)
LDL Cholesterol: 66 mg/dL (ref 10–99)
NonHDL: 77.17
Total CHOL/HDL Ratio: 2
Triglycerides: 56 mg/dL (ref 10.0–149.0)
VLDL: 11.2 mg/dL (ref 0.0–40.0)

## 2024-05-27 LAB — HEMOGLOBIN A1C: Hgb A1c MFr Bld: 6.1 % (ref 4.6–6.5)

## 2024-05-28 ENCOUNTER — Ambulatory Visit: Payer: Self-pay | Admitting: Internal Medicine

## 2024-05-28 ENCOUNTER — Ambulatory Visit: Admitting: Internal Medicine

## 2024-05-28 ENCOUNTER — Encounter: Payer: Self-pay | Admitting: Internal Medicine

## 2024-05-28 VITALS — BP 122/78 | HR 82 | Temp 97.6°F | Ht 62.0 in | Wt 165.6 lb

## 2024-05-28 DIAGNOSIS — E7800 Pure hypercholesterolemia, unspecified: Secondary | ICD-10-CM | POA: Diagnosis not present

## 2024-05-28 DIAGNOSIS — D696 Thrombocytopenia, unspecified: Secondary | ICD-10-CM | POA: Diagnosis not present

## 2024-05-28 DIAGNOSIS — I10 Essential (primary) hypertension: Secondary | ICD-10-CM

## 2024-05-28 DIAGNOSIS — D351 Benign neoplasm of parathyroid gland: Secondary | ICD-10-CM

## 2024-05-28 DIAGNOSIS — G8191 Hemiplegia, unspecified affecting right dominant side: Secondary | ICD-10-CM | POA: Diagnosis not present

## 2024-05-28 DIAGNOSIS — R739 Hyperglycemia, unspecified: Secondary | ICD-10-CM | POA: Diagnosis not present

## 2024-05-28 DIAGNOSIS — F439 Reaction to severe stress, unspecified: Secondary | ICD-10-CM

## 2024-05-28 DIAGNOSIS — Z0000 Encounter for general adult medical examination without abnormal findings: Secondary | ICD-10-CM

## 2024-05-28 DIAGNOSIS — D329 Benign neoplasm of meninges, unspecified: Secondary | ICD-10-CM

## 2024-05-28 DIAGNOSIS — Z1231 Encounter for screening mammogram for malignant neoplasm of breast: Secondary | ICD-10-CM

## 2024-05-28 MED ORDER — HYDROCHLOROTHIAZIDE 12.5 MG PO CAPS: 12.5000 mg | ORAL_CAPSULE | Freq: Every day | ORAL | 1 refills | Status: AC

## 2024-05-28 NOTE — Progress Notes (Signed)
 "  Subjective:    Patient ID: Rachael Zimmerman, female    DOB: [birth year], 63 y.o.   MRN: [id number]  Patient here for  Chief Complaint  Patient presents with   Annual Exam    HPI Here for a physical exam. Saw gyn 09/2023 - annual. Pap performed. Also had breast exam per pt. Overall doing relatively well. Breathing stable. No chest pain reported. Does report pain - base of right thumb. Also left shoulder pain. Requested PT for shoulder. Persistent issues with her thumb - requested referral to ortho. No abdominal pain or bowel change reported.    Past Medical History:  Diagnosis Date   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HCC)    s/p craniectomy   Hypertension    Pre-diabetes    patient denies ; see lab result for a1c 03-06-17 is 6.0   Seizures (HCC)    AFTER BRAIN SURGERY; none recently    Weakness of right side of body    d/t brain tumor intervention    Past Surgical History:  Procedure Laterality Date   ABDOMINAL HYSTERECTOMY  2010   ovaries not removed   BRAIN SURGERY     tumor excision s/p craniectomy   BREAST BIOPSY Left 2010   benign   CATARACT EXTRACTION W/PHACO Left 11/07/2016   Procedure: CATARACT EXTRACTION PHACO AND INTRAOCULAR LENS PLACEMENT (IOC);  Surgeon: Zimmerman, Steven, MD;  Location: [facility];  Service: Ophthalmology;  Laterality: Left;  US  00:53.9 AP% 16.7 CDE 17.02 Fluid Pack Lot # [id number]   CATARACT EXTRACTION W/PHACO Right 07/23/2022   Procedure: CATARACT EXTRACTION PHACO AND INTRAOCULAR LENS PLACEMENT (IOC) RIGHT;  Surgeon: Rachael Adine Anes, MD;  Location: [facility];  Service: Ophthalmology;  Laterality: Right;  2.88 0:17.6   PARATHYROIDECTOMY Right 03/14/2017   Procedure: RIGHT PARATHYROIDECTOMY;  Surgeon: Rachael Boas, MD;  Location: [facility];  Service: General;  Laterality: Right;   Family History  Problem Relation Age of Onset   Breast cancer Mother 36   Cancer Mother        ovary   Colon cancer Neg Hx    Social History   Socioeconomic History    Marital status: Legally Separated    Spouse name: Not on file   Number of children: 1   Years of education: Not on file   Highest education level: Bachelor's degree (e.g., BA, AB, BS)  Occupational History    Employer: [employer]  Tobacco Use   Smoking status: Never   Smokeless tobacco: Never  Vaping Use   Vaping status: Never Used  Substance and Sexual Activity   Alcohol use: Yes    Alcohol/week: 0.0 standard drinks of alcohol    Comment: seldom    Drug use: No   Sexual activity: Not on file  Other Topics Concern   Not on file  Social History Narrative   married   Social Drivers of Health   Tobacco Use: Low Risk (06/06/2024)   Patient History    Smoking Tobacco Use: Never    Smokeless Tobacco Use: Never    Passive Exposure: Not on file  Financial Resource Strain: Low Risk (01/20/2024)   Overall Financial Resource Strain (CARDIA)    Difficulty of Paying Living Expenses: Not very hard  Food Insecurity: No Food Insecurity (01/20/2024)   Epic    Worried About Programme Researcher, Broadcasting/film/video in the Last Year: Never true    Ran Out of Food in the Last Year: Never true  Transportation Needs: No Transportation  Needs (01/20/2024)   Epic    Lack of Transportation (Medical): No    Lack of Transportation (Non-Medical): No  Physical Activity: Insufficiently Active (01/20/2024)   Exercise Vital Sign    Days of Exercise per Week: 2 days    Minutes of Exercise per Session: 20 min  Stress: No Stress Concern Present (01/20/2024)   [employer] of Occupational Health - Occupational Stress Questionnaire    Feeling of Stress: Not at all  Social Connections: Moderately Integrated (01/20/2024)   Social Connection and Isolation Panel    Frequency of Communication with Friends and Family: More than three times a week    Frequency of Social Gatherings with Friends and Family: More than three times a week    Attends Religious Services: More than 4 times per year    Active Member  of Clubs or Organizations: No    Attends Banker Meetings: Not on file    Marital Status: Married  Depression (PHQ2-9): Low Risk (05/28/2024)   Depression (PHQ2-9)    PHQ-2 Score: 2  Alcohol Screen: Low Risk (01/20/2024)   Alcohol Screen    Last Alcohol Screening Score (AUDIT): 1  Housing: Low Risk (01/20/2024)   Epic    Unable to Pay for Housing in the Last Year: No    Number of Times Moved in the Last Year: 0    Homeless in the Last Year: No  Utilities: Not At Risk (12/03/2023)   Epic    Threatened with loss of utilities: No  Health Literacy: Adequate Health Literacy (12/03/2023)   B1300 Health Literacy    Frequency of need for help with medical instructions: Never     Review of Systems  Constitutional:  Negative for appetite change and unexpected weight change.  HENT:  Negative for congestion, sinus pressure and sore throat.   Eyes:  Negative for pain and visual disturbance.  Respiratory:  Negative for cough, chest tightness and shortness of breath.   Cardiovascular:  Negative for chest pain, palpitations and leg swelling.  Gastrointestinal:  Negative for abdominal pain, diarrhea, nausea and vomiting.  Genitourinary:  Negative for difficulty urinating and dysuria.  Musculoskeletal:  Negative for myalgias.       Pain base of right thumb. Left shoulder issues as outlined.   Skin:  Negative for color change and rash.  Neurological:  Negative for dizziness and headaches.  Hematological:  Negative for adenopathy. Does not bruise/bleed easily.  Psychiatric/Behavioral:  Negative for agitation and dysphoric mood.        Objective:     BP 122/78   Pulse 82   Temp 97.6 F (36.4 C)   Ht 5' 2 (1.575 m)   Wt 165 lb 9.6 oz (75.1 kg)   SpO2 99%   BMI 30.29 kg/m  Wt Readings from Last 3 Encounters:  05/28/24 165 lb 9.6 oz (75.1 kg)  01/21/24 170 lb 9.6 oz (77.4 kg)  12/10/23 172 lb 12.8 oz (78.4 kg)    Physical Exam Vitals reviewed.  Constitutional:       General: She is not in acute distress.    Appearance: Normal appearance.  HENT:     Head: Normocephalic and atraumatic.     Right Ear: External ear normal.     Left Ear: External ear normal.     Mouth/Throat:     Pharynx: No oropharyngeal exudate or posterior oropharyngeal erythema.  Eyes:     General: No scleral icterus.       Right eye: No discharge.  Left eye: No discharge.     Conjunctiva/sclera: Conjunctivae normal.  Neck:     Thyroid : No thyromegaly.  Cardiovascular:     Rate and Rhythm: Normal rate and regular rhythm.  Pulmonary:     Effort: No respiratory distress.     Breath sounds: Normal breath sounds. No wheezing.  Abdominal:     General: Bowel sounds are normal.     Palpations: Abdomen is soft.     Tenderness: There is no abdominal tenderness.  Musculoskeletal:        General: No swelling or tenderness.     Cervical back: Neck supple. No tenderness.     Comments: Pain - base of right thumb with palpation. Increased pain - left shoulder with abduction.   Lymphadenopathy:     Cervical: No cervical adenopathy.  Skin:    Findings: No erythema or rash.  Neurological:     Mental Status: She is alert.  Psychiatric:        Mood and Affect: Mood normal.        Behavior: Behavior normal.         Outpatient Encounter Medications as of 05/28/2024  Medication Sig   Cholecalciferol 25 MCG (1000 UT) capsule Take 1,000 Units by mouth daily.   rosuvastatin  (CRESTOR ) 10 MG tablet TAKE 1 TABLET BY MOUTH EVERY DAY   vitamin B-12 (CYANOCOBALAMIN) 1000 MCG tablet Take 1,000 mcg by mouth daily.   hydrochlorothiazide  (MICROZIDE ) 12.5 MG capsule Take 1 capsule (12.5 mg total) by mouth daily.   [DISCONTINUED] Acetaminophen  (TYLENOL  PO) Take by mouth as needed. (Patient not taking: Reported on 01/21/2024)   [DISCONTINUED] hydrochlorothiazide  (MICROZIDE ) 12.5 MG capsule Take 1 capsule (12.5 mg total) by mouth daily.   No facility-administered encounter medications on file as  of 05/28/2024.     Lab Results  Component Value Date   WBC 8.9 05/27/2024   HGB 13.0 05/27/2024   HCT 39.3 05/27/2024   PLT 164.0 05/27/2024   GLUCOSE 110 (H) 05/27/2024   CHOL 130 05/27/2024   TRIG 56.0 05/27/2024   HDL 52.40 05/27/2024   LDLCALC 66 05/27/2024   ALT 22 05/27/2024   AST 15 05/27/2024   NA 143 05/27/2024   K 3.9 05/27/2024   CL 105 05/27/2024   CREATININE 0.86 05/27/2024   BUN 13 05/27/2024   CO2 33 (H) 05/27/2024   TSH 1.70 01/17/2024   HGBA1C 6.1 05/27/2024    MM 3D DIAGNOSTIC MAMMOGRAM UNILATERAL LEFT BREAST Result Date: 10/09/2023 CLINICAL DATA:  LEFT asymmetry callback EXAM: DIGITAL DIAGNOSTIC UNILATERAL LEFT MAMMOGRAM WITH TOMOSYNTHESIS AND CAD TECHNIQUE: Left digital diagnostic mammography and breast tomosynthesis was performed. The images were evaluated with computer-aided detection. COMPARISON:  Previous exam(s). ACR Breast Density Category b: There are scattered areas of fibroglandular density. FINDINGS: The previously described finding does not persist with additional views, consistent with superimposed fibroglandular tissue. Fibroglandular tissue assumes a configuration stable in comparison to more remote prior mammograms. No suspicious mass, microcalcification, or other finding is identified. IMPRESSION: No mammographic evidence of malignancy. RECOMMENDATION: Screening mammogram in one year.(Code:SM-B-01Y) I have discussed the findings and recommendations with the patient. If applicable, a reminder letter will be sent to the patient regarding the next appointment. BI-RADS CATEGORY  1: Negative. Electronically Signed   By: Rachael Zimmerman M.D.   On: 10/09/2023 09:51       Assessment & Plan:  Routine general medical examination at a health care facility  Health care maintenance Assessment & Plan: Physical today 05/28/24.  Saw gyn  08/2022  he does her breast, pelvic and pap smears. Up to date. Mammogram 10/04/23 - Birads 0.  F/u left breast mammogram  10/09/23 - Birads I. PAP 08/2022 - negative. Colonoscopy 12/31/19 - small lipoma in sigmoid colon, internal hemorrhoids otherwise normal. Repeat colonoscopy in 10 years Rachael Zimmerman).    Encounter for screening mammogram for malignant neoplasm of breast -     3D Screening Mammogram, Left and Right; Future  Hypercholesteremia Assessment & Plan: Continue crestor . Continue low cholesterol diet and exercise. Follow lipid panel.  Lab Results  Component Value Date   CHOL 130 05/27/2024   HDL 52.40 05/27/2024   LDLCALC 66 05/27/2024   TRIG 56.0 05/27/2024   CHOLHDL 2 05/27/2024     Orders: -     Basic metabolic panel with GFR; Future -     Lipid panel; Future -     Hepatic function panel; Future  Essential hypertension, benign Assessment & Plan: Continue hydrochlorothiazide . Blood pressure as outlined. No changes today. Follow metabolic panel.   Orders: -     Basic metabolic panel with GFR; Future  Hyperglycemia Assessment & Plan: Low carb diet and exercise. Follow met b and A1c.   Lab Results  Component Value Date   HGBA1C 6.1 05/27/2024     Orders: -     Hemoglobin A1c; Future  Thrombocytopenia Assessment & Plan: Platelet count 05/21/24 - wnl.    Stress Assessment & Plan: Overall appears to be doing well. Follow.    Right hemiparesis (HCC) Assessment & Plan: Stable. Follow.    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  Has been followed by Dr Rachael.  Follow calcium .     [facility]) Assessment & Plan: MRI 11/2020 - Stable appearance of small dural-based extra axial lesion in the right sphenoid wing, most consistent with a small meningioma. Recommended f/u MRI q 5-10 years.    Other orders -     hydroCHLOROthiazide ; Take 1 capsule (12.5 mg total) by mouth daily.  Dispense: 90 capsule; Refill: 1     Rachael Hamilton, MD "

## 2024-05-28 NOTE — Assessment & Plan Note (Signed)
 Physical today 05/28/24.  Saw gyn 08/2022  he does her breast, pelvic and pap smears. Up to date. Mammogram 10/04/23 - Birads 0.  F/u left breast mammogram 10/09/23 - Birads I. PAP 08/2022 - negative. Colonoscopy 12/31/19 - small lipoma in sigmoid colon, internal hemorrhoids otherwise normal. Repeat colonoscopy in 10 years Rachael Zimmerman).

## 2024-06-06 ENCOUNTER — Encounter: Payer: Self-pay | Admitting: Internal Medicine

## 2024-06-06 NOTE — Assessment & Plan Note (Signed)
 Continue hydrochlorothiazide . Blood pressure as outlined. No changes today. Follow metabolic panel.

## 2024-06-06 NOTE — Assessment & Plan Note (Signed)
 Overall appears to be doing well.  Follow.

## 2024-06-06 NOTE — Assessment & Plan Note (Signed)
Stable.  Follow.   

## 2024-06-06 NOTE — Assessment & Plan Note (Signed)
 Low carb diet and exercise. Follow met b and A1c.   Lab Results  Component Value Date   HGBA1C 6.1 05/27/2024

## 2024-06-06 NOTE — Assessment & Plan Note (Signed)
MRI 11/2020 - Stable appearance of small dural-based extra axial lesion in the right sphenoid wing, most consistent with a small meningioma. Recommended f/u MRI q 5-10 years. ? ?

## 2024-06-06 NOTE — Assessment & Plan Note (Signed)
 Platelet count 05/21/24 - wnl.

## 2024-06-06 NOTE — Assessment & Plan Note (Signed)
 Continue crestor . Continue low cholesterol diet and exercise. Follow lipid panel.  Lab Results  Component Value Date   CHOL 130 05/27/2024   HDL 52.40 05/27/2024   LDLCALC 66 05/27/2024   TRIG 56.0 05/27/2024   CHOLHDL 2 05/27/2024

## 2024-06-06 NOTE — Assessment & Plan Note (Signed)
S/p parathyroidectomy.  Has been followed by Dr Gerkin.  Follow calcium.   

## 2024-09-29 ENCOUNTER — Other Ambulatory Visit

## 2024-10-01 ENCOUNTER — Ambulatory Visit: Admitting: Internal Medicine

## 2024-12-07 ENCOUNTER — Ambulatory Visit
